# Patient Record
Sex: Female | Born: 1955 | Race: Black or African American | Hispanic: No | Marital: Single | Smoking: Never smoker
Health system: Southern US, Community
[De-identification: ages and names within clinical notes are randomized; demographics above are authoritative.]

## PROBLEM LIST (undated history)

## (undated) DIAGNOSIS — E079 Disorder of thyroid, unspecified: Secondary | ICD-10-CM

## (undated) DIAGNOSIS — I10 Essential (primary) hypertension: Secondary | ICD-10-CM

## (undated) DIAGNOSIS — E785 Hyperlipidemia, unspecified: Secondary | ICD-10-CM

## (undated) DIAGNOSIS — G4730 Sleep apnea, unspecified: Secondary | ICD-10-CM

## (undated) DIAGNOSIS — K219 Gastro-esophageal reflux disease without esophagitis: Secondary | ICD-10-CM

## (undated) DIAGNOSIS — J36 Peritonsillar abscess: Secondary | ICD-10-CM

## (undated) DIAGNOSIS — Z72820 Sleep deprivation: Secondary | ICD-10-CM

## (undated) DIAGNOSIS — M542 Cervicalgia: Secondary | ICD-10-CM

## (undated) DIAGNOSIS — F419 Anxiety disorder, unspecified: Secondary | ICD-10-CM

## (undated) DIAGNOSIS — F4310 Post-traumatic stress disorder, unspecified: Secondary | ICD-10-CM

## (undated) DIAGNOSIS — E669 Obesity, unspecified: Secondary | ICD-10-CM

## (undated) HISTORY — DX: Sleep deprivation: Z72.820

## (undated) HISTORY — DX: Hyperlipidemia, unspecified: E78.5

## (undated) HISTORY — DX: Obesity, unspecified: E66.9

## (undated) HISTORY — PX: BREAST LUMPECTOMY: SHX2

## (undated) HISTORY — PX: COLONOSCOPY: SHX174

## (undated) HISTORY — DX: Essential (primary) hypertension: I10

## (undated) HISTORY — DX: Disorder of thyroid, unspecified: E07.9

## (undated) HISTORY — DX: Post-traumatic stress disorder, unspecified: F43.10

## (undated) HISTORY — DX: Sleep apnea, unspecified: G47.30

## (undated) HISTORY — DX: Peritonsillar abscess: J36

## (undated) HISTORY — DX: Cervicalgia: M54.2

## (undated) HISTORY — DX: Anxiety disorder, unspecified: F41.9

## (undated) HISTORY — DX: Gastro-esophageal reflux disease without esophagitis: K21.9

## (undated) HISTORY — PX: ABDOMINAL HYSTERECTOMY: SHX81

---

## 2001-11-03 ENCOUNTER — Other Ambulatory Visit: Admission: RE | Admit: 2001-11-03 | Discharge: 2001-11-03 | Payer: Self-pay | Admitting: Obstetrics and Gynecology

## 2001-11-21 ENCOUNTER — Encounter: Admission: RE | Admit: 2001-11-21 | Discharge: 2002-02-19 | Payer: Self-pay | Admitting: Obstetrics and Gynecology

## 2003-01-29 ENCOUNTER — Other Ambulatory Visit: Admission: RE | Admit: 2003-01-29 | Discharge: 2003-01-29 | Payer: Self-pay | Admitting: Obstetrics and Gynecology

## 2004-02-04 ENCOUNTER — Other Ambulatory Visit: Admission: RE | Admit: 2004-02-04 | Discharge: 2004-02-04 | Payer: Self-pay | Admitting: Obstetrics and Gynecology

## 2005-06-14 ENCOUNTER — Ambulatory Visit (HOSPITAL_BASED_OUTPATIENT_CLINIC_OR_DEPARTMENT_OTHER): Admission: RE | Admit: 2005-06-14 | Discharge: 2005-06-14 | Payer: Self-pay | Admitting: Surgery

## 2005-06-14 ENCOUNTER — Ambulatory Visit (HOSPITAL_COMMUNITY): Admission: RE | Admit: 2005-06-14 | Discharge: 2005-06-14 | Payer: Self-pay | Admitting: Surgery

## 2005-11-04 ENCOUNTER — Ambulatory Visit (HOSPITAL_BASED_OUTPATIENT_CLINIC_OR_DEPARTMENT_OTHER): Admission: RE | Admit: 2005-11-04 | Discharge: 2005-11-04 | Payer: Self-pay | Admitting: Surgery

## 2005-11-04 ENCOUNTER — Ambulatory Visit (HOSPITAL_COMMUNITY): Admission: RE | Admit: 2005-11-04 | Discharge: 2005-11-04 | Payer: Self-pay | Admitting: Surgery

## 2007-10-17 ENCOUNTER — Encounter (INDEPENDENT_AMBULATORY_CARE_PROVIDER_SITE_OTHER): Payer: Self-pay | Admitting: Obstetrics & Gynecology

## 2007-10-17 ENCOUNTER — Ambulatory Visit (HOSPITAL_COMMUNITY): Admission: RE | Admit: 2007-10-17 | Discharge: 2007-10-18 | Payer: Self-pay | Admitting: Obstetrics & Gynecology

## 2010-03-25 ENCOUNTER — Encounter: Admission: RE | Admit: 2010-03-25 | Discharge: 2010-03-25 | Payer: Self-pay | Admitting: Internal Medicine

## 2010-11-08 HISTORY — PX: PARTIAL HYSTERECTOMY: SHX80

## 2010-11-08 HISTORY — PX: LAPAROSCOPIC TOTAL HYSTERECTOMY: SUR800

## 2011-03-23 NOTE — Op Note (Signed)
NAMESHERIAL, Rebecca [name]            ACCOUNT NO.:  [id number]   MEDICAL RECORD NO.:  [id number]          PATIENT TYPE:  AMB   LOCATION:  DAY                          FACILITY:  [facility]   PHYSICIAN:  Genia Del, M.D.DATE OF BIRTH:  1956/02/14   DATE OF PROCEDURE:  10/17/2007  DATE OF DISCHARGE:                               OPERATIVE REPORT   PREOPERATIVE DIAGNOSIS:  Large multiple symptomatic uterine myomas.   POSTOPERATIVE DIAGNOSIS:  Large multiple symptomatic uterine myomas.   PROCEDURE PERFORMED:  Total laparoscopic hysterectomy, assisted with Da  Vinci robot.   SURGEON:  Genia Del, M.D.   ASSISTANT:  Leanora Ivanoff, M.D.   ANESTHESIA:  General anesthesia with endotracheal intubation.   PROCEDURE IN DETAIL:  Under general anesthesia with endotracheal  intubation, the patient in the lithotomy position, she is prepped with  Betadine in the abdominal, suprapubic, vulvar, and vaginal areas.  The  bladder is catheterized and the Foley is left in place, and the patient  is draped as usual.  The vaginal examination reveals a uterus that goes  to the umbilicus, measuring 18 cm to 20 cm.  It is mobile.  No adnexal  mass is felt.  We introduced a weighted speculum in the vagina.  The  anterior lip of the cervix is grasped with a tenaculum.  We did a  hysterometry, which is at 8 cm.  We then dilated the cervix with Hegar  dilators and inserted the cord ring with a Rumi, as usual.  We then  removed all other instruments.  We go to abdominal time.  We measure 10  cm above the fundus of the uterus and infiltrated the skin at that level  with Marcaine 0.25% plain.  We then make a 1.5-cm incision with the  scalpel.  We open the aponeurosis under direct vision with Mayo scissors  and the parietal peritoneum bluntly with the finger.  We put a  pursestring suture of Vicryl #0 at the aponeurosis.  We inserted the  sign and the camera at that level.  Inspection of the  abdominopelvic  cavity reveals a very large uterus, very irregular, with multiple large  fibroids.  No other pathology is seen.  No adhesion is present.  We  therefore measure all other sites.  The pneumoperitoneum was made with  CO2.  We have three robotic trocars and one assistance port.  Those are  placed in a W configuration.  We then docked the robot as usual without  difficulty and inserted the instrument.  In the right hand, the  EndoSHEARS scissors, in the left hand the bipolar fenestrated clamp, and  in the third robotic arm, the Cobra clamp.  We started the consult.  We  began on the left side.  We cauterize in sections.  The left round  ligament, the left tube, and the left utero-ovarian ligament.  Fibroids  are present on the broad ligament of that site, which made the surgery a  little bit more difficult, but we are able with the third arm to expose  well and review until we reach surgery for the uterine artery on the  left.  We opened the visceral peritoneum over the lower uterine segment  and reclined the bladder downward.  We then switched side and do exactly  the same thing on the right side.  The bladder is further reclined  downward.  We then skeletonized the uterine arteries on each side and  cauterized them in sections.  We noticed that the uterus is blanching.  We then positioned ourselves to open the vagina anterior over the cord  ring.  We felt the colpotomy anteriorly and proceed on each side and  then finish posteriorly.  The uterus is therefore completely detached.  We proceeded above the uterosacral ligaments to keep this portion of the  vagina.  We then removed the cord ring with the Rumi vaginally and let  the uterus sit in the abdomen while we closed the vaginal mucosa.  We  put the Kleppinger in the vagina to keep the pneumoperitoneum.  We used  Vicryl #0 in figure-of-eight to suture the vaginal vault.  It is  completely closed.  Hemostasis is adequate.   Note that the cutting  needle driver was used in the right hand, the normal needle driver in  the left, and in the third arm the fenestrated bipolar was kept.  We  then removed all instruments.  We undocked the robot and go to  laparoscopic time.  We removed the assistant port and replaced it by the  morcellator.  Morcellation of the uterus is done easily.  The specimen  is sent to pathology.  We then irrigate and suction the abdominopelvic  cavity.  Hemostasis is adequate at all levels.  We then removed all  instruments.  We removed the ports under direct vision.  We closed the  supraumbilical incision at the aponeurosis by attaching the pursestring  stitch.  We then closed all incisions at the skin with a subcuticular  Vicryl 4-0.  We then add Dermabond on top.  We remove the procedure in  the vagina.  Hemostasis was adequate at all levels.  The counts for  instruments and sponges were complete.  The estimated blood loss was 100  mL.  No complication occurred, and the patient was brought to the  recovery room in good stable fashion.  Note that a dose of Ancef 1 gram  intravenously was given at the beginning of the intervention.      Genia Del, M.D.  Electronically Signed     ML/MEDQ  D:  10/17/2007  T:  10/17/2007  Job:  [id number]

## 2011-03-26 NOTE — Op Note (Signed)
Rebecca [name], Rebecca [name]            ACCOUNT NO.:  [id number]   MEDICAL RECORD NO.:  [id number]          PATIENT TYPE:  AMB   LOCATION:  NESC                         FACILITY:  [facility]   PHYSICIAN:  Currie Paris, M.D.DATE OF BIRTH:  21-Jun-1956   DATE OF PROCEDURE:  06/14/2005  DATE OF DISCHARGE:                                 OPERATIVE REPORT   OFFICE NUMBER:  [facility] [phone].   PREOPERATIVE DIAGNOSIS:  Fistula in ano.   POSTOPERATIVE DIAGNOSIS:  Fistula in ano.   PROCEDURE:  Exam under exam under anesthesia with placement of fistula plug.   SURGEON:  Currie Paris, M.D.   ASSISTANT:  Stormy Card, P.A. student   ANESTHESIA:  General endotracheal.   CLINICAL HISTORY:  This is a 48-year lady who has recently had a fairly  large perirectal abscess.  On followup, she has continued to have drainage  and we felt she had a fistula in ano.   DESCRIPTION OF PROCEDURE:  The patient was taken to the holding area.  She  had no further questions.  She confirmed the plans for the procedures as  exam under anesthesia needle and either fistulotomy or placement of Seton or  fistula plug.  The patient taken to the operating room and after  satisfactory general tracheal anesthesia had been obtained, she was placed  in the prone position with appropriate padding. The time-out occurred.   The perianal area had been prepped and draped with some Betadine. We  inserted the anoscope and initially did not see any internal opening or any  inflammatory process internally.  However, there was the external opening  which was in the left posterior aspect. I placed a probe in this and got it  heading down towards the anus and underneath the external sphincter but  initially did not seen any internal opening still.  I then irrigated the  tract was some peroxide and saw some peroxide coming into the anus.  Once I  identified the internal opening, I was then able to get a probe through  this.  I  irrigated the tract out with some peroxide and then pulled a 2-0  silk suture through this. It looked like we were going directly posteriorly  as an entry into the rectum, and I elected to put a fistula plug in. The  plug was moistened for 10 minutes and then pulled through the tract. It was  secured with a figure-of-eight stitch of 3-0 chromic followed by second 3-0  chromic to be sure we had it completely covered.  It was then anchored  externally with another 3-0 chromic.   The fistula plug appeared to be nicely in situ.  There is no exposure of the  plug, and I thought we had it well anchored.   Sterile dressings applied. The patient tolerated the procedure well.  There  were no complications. All counts were correct.       CJS/MEDQ  D:  06/14/2005  T:  06/15/2005  Job:  [id number]

## 2011-03-26 NOTE — Op Note (Signed)
[name], [name]            ACCOUNT NO.:  [id number]   MEDICAL RECORD NO.:  [id number]          PATIENT TYPE:  AMB   LOCATION:  NESC                         FACILITY:  [facility]   PHYSICIAN:  Currie Paris, M.D.DATE OF BIRTH:  1955-12-02   DATE OF PROCEDURE:  11/04/2005  DATE OF DISCHARGE:                                 OPERATIVE REPORT   CCS:  [phone].   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   OPERATION:  Partial fistulotomy with placement Seton.   SURGEON:  Currie Paris, M.D.   ANESTHESIA:  General.   CLINICAL HISTORY:  This lady is 55 year old who presented with a fistula-in-  ano that had a fistula plug placed, but which was not successful in  completely eliminating the fistula. Because of continued presence of the  fistula, we elected to bring her back to the operating room for fistulotomy.   DESCRIPTION OF PROCEDURE:  The patient was seen in the holding area and she  had no further questions. We reviewed the plans and the possible placement  of Seton again.   The patient was taken to the operating room and after satisfactory general  anesthesia had been obtained was placed lithotomy position. The perirectal  areas were prepped and draped as a sterile field. The time-out occurred.   Anoscope was placed. The fistula tract was noted to be opening left  posteriorly. I was able to readily place a probe through the fistula and it  did go through and under a little bit of the sphincter muscle.   I opened the fistula starting from the external opening going towards the  anus for about 80% of its length. However I avoided cutting the sphincter  muscle leaving a band of what looked to be internal sphincter intact. I then  placed a #0 silk suture through there and tied it down as a Seton.   I made sure all the chronic granulations had been either cauterized or  scraped away. I put some 0.5%  Marcaine with epi in to help with postop  analgesia. Sterile dressings were applied. The patient tolerated the  procedure well. There were no operative complications. All counts were  correct.     Currie Paris, M.D.  Electronically Signed    CJS/MEDQ  D:  11/04/2005  T:  11/04/2005  Job:  [id number]

## 2011-08-16 LAB — CBC
HCT: 36.8
HCT: 39.9
Hemoglobin: 12.5
Hemoglobin: 13.5
MCHC: 33.8
MCHC: 34
MCV: 88.4
MCV: 88.7
RBC: 4.15
RDW: 13.8

## 2012-06-16 ENCOUNTER — Ambulatory Visit (INDEPENDENT_AMBULATORY_CARE_PROVIDER_SITE_OTHER): Payer: BC Managed Care – PPO | Admitting: Physician Assistant

## 2012-06-16 VITALS — BP 116/76 | HR 80 | Temp 98.3°F | Resp 16 | Ht 63.0 in | Wt 236.2 lb

## 2012-06-16 DIAGNOSIS — H6120 Impacted cerumen, unspecified ear: Secondary | ICD-10-CM

## 2012-06-16 DIAGNOSIS — E7800 Pure hypercholesterolemia, unspecified: Secondary | ICD-10-CM | POA: Insufficient documentation

## 2012-06-16 DIAGNOSIS — E785 Hyperlipidemia, unspecified: Secondary | ICD-10-CM | POA: Insufficient documentation

## 2012-06-16 DIAGNOSIS — M79643 Pain in unspecified hand: Secondary | ICD-10-CM | POA: Insufficient documentation

## 2012-06-16 DIAGNOSIS — B353 Tinea pedis: Secondary | ICD-10-CM

## 2012-06-16 NOTE — Progress Notes (Addendum)
  Subjective:    Patient ID: Rebecca [name], female    DOB: August 20, 1956, 56 y.o.   MRN: [id number]  HPI This 56 y.o. female presents for evaluation of ear pain, decreased hearing, feeling off balance.  Began 3 days ago, resolved briefly, but returned yesterday and is progressively worsening.  Has developed fatigue/generalized malaise. Also notes itching on the bottom of the tight foot x 1 week.  Concerned that she may have a fungus.  Has tried no medications to treat her symptoms, though considered an OTC Athlete's foot cream.  No nasal congestion, drainage, sore throat, cough, GI/GU symptoms.    Review of Systems As above.  NO CP, SOB, HA.  Past Medical History  Diagnosis Date  . Hyperlipidemia   . Obesity     Past Surgical History  Procedure Date  . Abdominal hysterectomy     Prior to Admission medications   Medication Sig Start Date End Date Taking? Authorizing Provider  Multiple Vitamins-Minerals (MULTIVITAMIN PO) Take 1 tablet by mouth.   Yes Historical Provider, MD  Omega-3 Fatty Acids (FISH OIL) 1200 MG CAPS Take 1,200 mg by mouth every morning.   Yes Historical Provider, MD    No Known Allergies  History   Social History  . Marital Status: Single    Spouse Name: N/A    Number of Children: 0  . Years of Education: 18.5   Occupational History  . Retired [employer]    [employer] and [employer] x 30 years  . 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 Smoking status: Never Smoker   . Smokeless tobacco: Never Used  . Alcohol Use: 2.5 - 3.5 oz/week    5-7 drink(s) per week  . Drug Use: Not on file  . Sexually Active: Not on file   Other Topics Concern  . Not on file   Social History Narrative   Close friend/significant other killed (GSW) 2012.Retired after 30 years, and took another job (commutes to [place] 4 days/week).    Family History  Problem Relation Age of Onset  . Cancer Mother     pancreatic  cancer  . Diabetes Mother   . Heart disease Mother     rheumatic heart disease  . COPD Sister   . Sleep apnea Sister        Objective:   Physical Exam  Blood pressure 116/76, pulse 80, temperature 98.3 F (36.8 C), temperature source Oral, resp. rate 16, height 5\' 3"  (1.6 m), weight 236 lb 3.2 oz (107.14 kg), SpO2 98.00%. Body mass index is 41.84 kg/(m^2). Well-developed, well nourished BF who is awake, alert and oriented, in NAD. HEENT: [place]/AT, PERRL, EOMI.  Sclera and conjunctiva are clear.  Left canal is impacted with cerumen.  Irrigation performed and impaction removed. Then EAC are patent, TMs are normal in appearance. Nasal mucosa is pink and moist. OP is clear. Neck: supple, non-tender, no lymphadenopathy, thyromegaly. Heart: RRR, no murmur Lungs: CTA Abdomen: normo-active bowel sounds, supple, non-tender, no mass or organomegaly. Extremities: no cyanosis, clubbing or edema. Skin: warm and dry with mild erythema of the toes and web spaces of the toes on the left foot.     Assessment & Plan:   1. Cerumen impaction   2.  Tinea Pedis.  If symptoms persist, consider ETD as cause and I would call in Ipratropium bromide nasal spray. OTC antifungal cream BID until resolved.

## 2012-08-02 ENCOUNTER — Ambulatory Visit: Payer: BC Managed Care – PPO

## 2012-08-02 ENCOUNTER — Ambulatory Visit (INDEPENDENT_AMBULATORY_CARE_PROVIDER_SITE_OTHER): Payer: BC Managed Care – PPO | Admitting: Family Medicine

## 2012-08-02 VITALS — BP 138/74 | HR 83 | Temp 98.0°F | Resp 18 | Ht 62.5 in | Wt 236.2 lb

## 2012-08-02 DIAGNOSIS — R079 Chest pain, unspecified: Secondary | ICD-10-CM

## 2012-08-02 MED ORDER — GI COCKTAIL ~~LOC~~
30.0000 mL | Freq: Once | ORAL | Status: AC
  Administered 2012-08-02: 30 mL via ORAL

## 2012-08-02 NOTE — Progress Notes (Signed)
[facility] [address] [place] [address] [phone]  Date:  08/02/2012   Name:  Rebecca [name]   DOB:  08/06/1956   MRN:  [id number]  PCP:  No primary provider on file.    Chief Complaint: Chest Pain and Shortness of Breath   History of Present Illness:  Rebecca [name] is a 56 y.o. very pleasant female patient who presents with the following:  She is here today with a concern about possible indigestion causing left sided CP.  She noted the symptoms when she woke up Monday morning.  She feels an ache in her left chest, down her left arm and in her left back.  It feels like a crick in her neck- it hurts if she twists her upper torso. She "has to turn her whole body instead of just her head" when she looks to the left or she has pain.  No trauma.   She has tried some OTC indigestion medications, but these have not really helped.  She has taken some Tums and room- temperature Pepsi.  These things usually help for her indigestion, but they have not helped this time.   She has no history of heart problems.  She does have high cholesterol.  Never a smoker.   Family history: her father died of bone cancer, mother had pancreatic cancer.  One brother had an MI at age 80.  Other maternal relatives suffered from cancer.    She does feel "a little winded" with this- it hurts to take a deep breath.    She has never had this in the past.    The symptom have been persistent since Monday.  Not worse with walking or other exertion.   She works a lot and travels to [place] and back most days for her job.    Patient Active Problem List  Diagnosis  . Hand pain  . Hyperlipidemia    Past Medical History  Diagnosis Date  . Hyperlipidemia   . Obesity     Past Surgical History  Procedure Date  . Abdominal hysterectomy     History  Substance Use Topics  . Smoking status: Never Smoker   . Smokeless tobacco: Never Used  . Alcohol Use: 2.5 - 3.5 oz/week   5-7 drink(s) per week    Family History  Problem Relation Age of Onset  . Cancer Mother     pancreatic cancer  . Diabetes Mother   . Heart disease Mother     rheumatic heart disease  . COPD Sister   . Sleep apnea Sister     No Known Allergies  Medication list has been reviewed and updated.  Current Outpatient Prescriptions on File Prior to Visit  Medication Sig Dispense Refill  . Multiple Vitamins-Minerals (MULTIVITAMIN PO) Take 1 tablet by mouth.      . Omega-3 Fatty Acids (FISH OIL) 1200 MG CAPS Take 1,200 mg by mouth every morning.        Review of Systems:  As per HPI- otherwise negative.   Physical Examination: Filed Vitals:   08/02/12 1543  BP: 138/74  Pulse: 83  Temp: 98 F (36.7 C)  Resp: 18   Filed Vitals:   08/02/12 1543  Height: 5' 2.5" (1.588 m)  Weight: 236 lb 3.2 oz (107.14 kg)   Body mass index is 42.51 kg/(m^2). Ideal Body Weight: Weight in (lb) to have BMI = 25: 138.6   GEN: WDWN, NAD, Non-toxic, A & O x  3, obese HEENT: Atraumatic, Normocephalic. Neck supple. No masses, No LAD. Bilateral TM wnl, oropharynx normal.  PEERL,EOMI.   Ears and Nose: No external deformity. CV: RRR, No M/G/R. No JVD. No thrill. No extra heart sounds.  Pressing on her left upper chest wall bring back the same pain that she has noted at home.  There is no redness, rash, or other abnormality of the skin PULM: CTA B, no wheezes, crackles, rhonchi. No retractions. No resp. distress. No accessory muscle use. ABD: S, NT, ND.  EXTR: No c/c/e NEURO Normal gait.  PSYCH: Normally interactive. Conversant. Not depressed or anxious appearing.  Calm demeanor.   [facility] reading (PRIMARY) by  Dr. Patsy Lager.  CXR: negative EKG: NSR, no ST elevation or depression, normal tracing  Gave a GI cocktail- this did seem to help decrease her symptoms  Assessment and Plan: 1. Chest pain  EKG 12-Lead, gi cocktail (Maalox,Lidocaine,Donnatal), DG Chest 2 View   Recie is having CP which seems to  be due to her chest wall.  I am able to reproduce pain which she states is the same pain she has noted at home by pressing on her chest wall.  The GI cocktail also seemed to help some, so there may be an element of GERD as well.  Recommended that she try tylenol for her chest wall pain, and pepcid or zantac for her GERD.  Explained that I cannot totally rule- out other etiologies such as cardiac pain or a PE here at clinic, but that chest wall pain is the more likely culprit today.  She declined an ED evaluation, but will seek care right away if he symptoms change or get worse.    Abbe Amsterdam, MD

## 2012-08-04 ENCOUNTER — Emergency Department (HOSPITAL_COMMUNITY)
Admission: EM | Admit: 2012-08-04 | Discharge: 2012-08-04 | Disposition: A | Discharge status: Home / Self Care | Payer: BC Managed Care – PPO | Attending: Emergency Medicine | Admitting: Emergency Medicine

## 2012-08-04 ENCOUNTER — Emergency Department (HOSPITAL_COMMUNITY): Payer: BC Managed Care – PPO

## 2012-08-04 ENCOUNTER — Encounter (HOSPITAL_COMMUNITY): Payer: Self-pay | Admitting: *Deleted

## 2012-08-04 DIAGNOSIS — M5412 Radiculopathy, cervical region: Secondary | ICD-10-CM

## 2012-08-04 DIAGNOSIS — E669 Obesity, unspecified: Secondary | ICD-10-CM | POA: Insufficient documentation

## 2012-08-04 DIAGNOSIS — E785 Hyperlipidemia, unspecified: Secondary | ICD-10-CM | POA: Insufficient documentation

## 2012-08-04 LAB — CBC WITH DIFFERENTIAL/PLATELET
Basophils Absolute: 0 10*3/uL (ref 0.0–0.1)
Basophils Relative: 0 % (ref 0–1)
Eosinophils Absolute: 0.2 10*3/uL (ref 0.0–0.7)
Eosinophils Relative: 2 % (ref 0–5)
HCT: 37.1 % (ref 36.0–46.0)
MCH: 28.7 pg (ref 26.0–34.0)
MCHC: 32.3 g/dL (ref 30.0–36.0)
MCV: 88.8 fL (ref 78.0–100.0)
Monocytes Absolute: 0.7 10*3/uL (ref 0.1–1.0)
Platelets: 251 10*3/uL (ref 150–400)
RDW: 13.1 % (ref 11.5–15.5)
WBC: 10.1 10*3/uL (ref 4.0–10.5)

## 2012-08-04 LAB — BASIC METABOLIC PANEL
CO2: 23 mEq/L (ref 19–32)
Calcium: 9.1 mg/dL (ref 8.4–10.5)
Creatinine, Ser: 0.65 mg/dL (ref 0.50–1.10)
GFR calc Af Amer: >90 mL/min (ref >90)
GFR calc non Af Amer: >90 mL/min (ref >90)
Sodium: 137 mEq/L (ref 135–145)

## 2012-08-04 LAB — PRO B NATRIURETIC PEPTIDE: Pro B Natriuretic peptide (BNP): 61.9 pg/mL (ref 0–125)

## 2012-08-04 LAB — POCT I-STAT TROPONIN I: Troponin i, poc: 0 ng/mL (ref 0.00–0.08)

## 2012-08-04 MED ORDER — ONDANSETRON HCL 4 MG/2ML IJ SOLN: 4.0000 mg | Freq: Once | INTRAMUSCULAR | Status: DC

## 2012-08-04 MED ORDER — IBUPROFEN 600 MG PO TABS: 600.0000 mg | ORAL_TABLET | Freq: Four times a day (QID) | ORAL | Status: DC | PRN

## 2012-08-04 MED ORDER — OXYCODONE-ACETAMINOPHEN 7.5-325 MG PO TABS: 1.0000 | ORAL_TABLET | ORAL | Status: DC | PRN

## 2012-08-04 MED ORDER — HYDROMORPHONE HCL PF 1 MG/ML IJ SOLN: 1.0000 mg | Freq: Once | INTRAMUSCULAR | Status: DC

## 2012-08-04 MED ORDER — ONDANSETRON 4 MG PO TBDP
4.0000 mg | ORAL_TABLET | Freq: Once | ORAL | Status: AC
  Administered 2012-08-04: 4 mg via ORAL
  Filled 2012-08-04: qty 1

## 2012-08-04 MED ORDER — HYDROMORPHONE HCL PF 2 MG/ML IJ SOLN
2.0000 mg | Freq: Once | INTRAMUSCULAR | Status: AC
  Administered 2012-08-04: 2 mg via INTRAMUSCULAR
  Filled 2012-08-04: qty 1

## 2012-08-04 MED ORDER — KETOROLAC TROMETHAMINE 60 MG/2ML IM SOLN
60.0000 mg | Freq: Once | INTRAMUSCULAR | Status: AC
  Administered 2012-08-04: 60 mg via INTRAMUSCULAR
  Filled 2012-08-04: qty 2

## 2012-08-04 MED ORDER — KETOROLAC TROMETHAMINE 30 MG/ML IJ SOLN: 30.0000 mg | Freq: Once | INTRAMUSCULAR | Status: DC

## 2012-08-04 MED ORDER — ASPIRIN 81 MG PO CHEW
324.0000 mg | CHEWABLE_TABLET | Freq: Once | ORAL | Status: AC
  Administered 2012-08-04: 324 mg via ORAL
  Filled 2012-08-04: qty 4

## 2012-08-04 NOTE — ED Notes (Signed)
Pt reports a lot of events in life possible causing stresss

## 2012-08-04 NOTE — ED Notes (Signed)
Patient returned from X-ray 

## 2012-08-04 NOTE — ED Notes (Signed)
MD at bedside. 

## 2012-08-04 NOTE — ED Notes (Signed)
Pt present to ED with c/o CP since Monday.  Pt went to [facility] on wednesday. And received cxr, ekg which appears to be normal.  Pt was advised to come to ED if symptoms persist.  Pt c/o left side CP radiate to left shoulder and back.  Pt states "I thought it was indigestion so I took antacid and it just made me feel worse."  Pt took tylenol and xanax and no relief

## 2012-08-04 NOTE — ED Notes (Signed)
Pt verbalizes understanding 

## 2012-08-04 NOTE — ED Provider Notes (Signed)
History     CSN: [id number]  Arrival date & time 08/04/12  [id number]   First MD Initiated Contact with Patient 08/04/12 2042      Chief Complaint  Patient presents with  . Chest Pain     HPI Pt present to ED with c/o CP since Monday. Pt went to [facility] on wednesday. And received cxr, ekg which appears to be normal. Pt was advised to come to ED if symptoms persist. Pt c/o left side CP radiate to left shoulder and back. Pt states "I thought it was indigestion so I took antacid and it just made me feel worse." Pt took tylenol and xanax and no relief.  Pain is made worse with movement of left upper extremity and back.  Pain does radiate down left arm associated with some numbness.  Pain is been constant since Monday.  Past Medical History  Diagnosis Date  . Hyperlipidemia   . Obesity     Past Surgical History  Procedure Date  . Abdominal hysterectomy     Family History  Problem Relation Age of Onset  . Cancer Mother     pancreatic cancer  . Diabetes Mother   . Heart disease Mother     rheumatic heart disease  . COPD Sister   . Sleep apnea Sister     History  Substance Use Topics  . Smoking status: Never Smoker   . Smokeless tobacco: Never Used  . Alcohol Use: 2.5 - 3.5 oz/week    5-7 drink(s) per week    OB History    Grav Para Term Preterm Abortions TAB SAB Ect Mult Living                  Review of Systems  All other systems reviewed and are negative.    Allergies  Review of patient's allergies indicates no known allergies.  Home Medications   Current Outpatient Rx  Name Route Sig Dispense Refill  . MULTIVITAMIN PO Oral Take 1 tablet by mouth.    Marland Kitchen FISH OIL 1200 MG PO CAPS Oral Take 1,200 mg by mouth every morning.    . IBUPROFEN 600 MG PO TABS Oral Take 1 tablet (600 mg total) by mouth every 6 (six) hours as needed for pain. 30 tablet 0  . OXYCODONE-ACETAMINOPHEN 7.5-325 MG PO TABS Oral Take 1 tablet by mouth every 4 (four) hours as needed for  pain. 30 tablet 0    BP 134/74  Pulse 67  Temp 98.2 F (36.8 C) (Oral)  Resp 20  SpO2 100%  Physical Exam  Nursing note and vitals reviewed. Constitutional: She is oriented to person, place, and time. She appears well-developed. No distress.  HENT:  Head: Normocephalic and atraumatic.  Eyes: Pupils are equal, round, and reactive to light.  Neck: Normal range of motion.  Cardiovascular: Normal rate and intact distal pulses.   Pulmonary/Chest: No accessory muscle usage. No respiratory distress.         Reproduces the pain with palpation  Abdominal: Normal appearance. She exhibits no distension.  Musculoskeletal: Normal range of motion.  Neurological: She is alert and oriented to person, place, and time. No cranial nerve deficit.  Skin: Skin is warm and dry. No rash noted.  Psychiatric: She has a normal mood and affect. Her behavior is normal.    ED Course  Procedures (including critical care time)  Date: 08/04/2012  Rate: 72  Rhythm: normal sinus rhythm  QRS Axis: normal  Intervals: normal  ST/T  Wave abnormalities: normal  Conduction Disutrbances: none  Narrative Interpretation: unremarkable       Labs Reviewed  CBC WITH DIFFERENTIAL  POCT I-STAT TROPONIN I  BASIC METABOLIC PANEL  PRO B NATRIURETIC PEPTIDE   Dg Chest 2 View  08/04/2012  *RADIOLOGY REPORT*  Clinical Data: Left chest pain.  CHEST - 2 VIEW  Comparison: None.  Findings: Two views of the chest demonstrate clear lungs. Heart and mediastinum are within normal limits.  The trachea is midline. Bony structures are intact  IMPRESSION: No acute chest findings.   Original Report Authenticated By: Richarda Overlie, M.D.      1. Cervical radicular pain       MDM          Nelia Shi, MD 08/04/12 2241

## 2012-08-04 NOTE — ED Notes (Signed)
Pt c/o sob.

## 2012-08-10 ENCOUNTER — Ambulatory Visit (INDEPENDENT_AMBULATORY_CARE_PROVIDER_SITE_OTHER): Payer: BC Managed Care – PPO | Admitting: Physician Assistant

## 2012-08-10 ENCOUNTER — Ambulatory Visit: Payer: BC Managed Care – PPO

## 2012-08-10 ENCOUNTER — Encounter: Payer: Self-pay | Admitting: Physician Assistant

## 2012-08-10 VITALS — BP 158/90 | HR 86 | Temp 98.4°F | Resp 18 | Ht 62.5 in | Wt 235.0 lb

## 2012-08-10 DIAGNOSIS — M79603 Pain in arm, unspecified: Secondary | ICD-10-CM

## 2012-08-10 DIAGNOSIS — R202 Paresthesia of skin: Secondary | ICD-10-CM

## 2012-08-10 DIAGNOSIS — M79609 Pain in unspecified limb: Secondary | ICD-10-CM

## 2012-08-10 DIAGNOSIS — Z23 Encounter for immunization: Secondary | ICD-10-CM

## 2012-08-10 DIAGNOSIS — R0789 Other chest pain: Secondary | ICD-10-CM

## 2012-08-10 DIAGNOSIS — R209 Unspecified disturbances of skin sensation: Secondary | ICD-10-CM

## 2012-08-10 DIAGNOSIS — M47812 Spondylosis without myelopathy or radiculopathy, cervical region: Secondary | ICD-10-CM

## 2012-08-10 DIAGNOSIS — R071 Chest pain on breathing: Secondary | ICD-10-CM

## 2012-08-10 MED ORDER — CYCLOBENZAPRINE HCL 10 MG PO TABS: 10.0000 mg | ORAL_TABLET | Freq: Three times a day (TID) | ORAL | Status: DC | PRN

## 2012-08-10 MED ORDER — MELOXICAM 15 MG PO TABS: 15.0000 mg | ORAL_TABLET | Freq: Every day | ORAL | Status: DC

## 2012-08-10 NOTE — Progress Notes (Signed)
Subjective:    Patient ID: Rebecca [name], female    DOB: 14-Mar-1956, 56 y.o.   MRN: [id number]  HPI  This 56 y.o. female presents for evaluation of chest and left shoulder pain that radiates into the left arm, and tingling in the left index finger.  She presented with these symptoms and indigestion here 08/02/2012 where she was evaluated urgently.  EKG and CXR at that time were both normal and on exam her chest pain was reproducible with palpation of the chest wall and back.  She was advised to use acetaminophen and OTC H2 blocker/PPI.  When her symptoms worsened, she was evaluated in the ED 08/04/2012, where repeat EKG and CXR were normal.  She was told that she may have a pinched nerve, and that if her symptoms continued, she should have an MRI. She was prescribed percocet, which helps, but makes her too sleepy to take during her time at work.  Today she reports continued symptoms, having to turn her whole body to move her head laterally.  The symptoms are still only on the left.  She denies neck pain, HA, dizziness.  Only the index finger is tingling.  No lower extremity symptoms.  No shortness of breath.   Review of Systems As above.   Past Medical History  Diagnosis Date  . Hyperlipidemia   . Obesity     Past Surgical History  Procedure Date  . Abdominal hysterectomy     Prior to Admission medications   Medication Sig Start Date End Date Taking? Authorizing Provider  ibuprofen (ADVIL,MOTRIN) 600 MG tablet Take 1 tablet (600 mg total) by mouth every 6 (six) hours as needed for pain. 08/04/12  Yes Nelia Shi, MD  Multiple Vitamins-Minerals (MULTIVITAMIN PO) Take 1 tablet by mouth.   Yes Historical Provider, MD  Omega-3 Fatty Acids (FISH OIL) 1200 MG CAPS Take 1,200 mg by mouth every morning.   Yes Historical Provider, MD  oxyCODONE-acetaminophen (PERCOCET) 7.5-325 MG per tablet Take 1 tablet by mouth every 4 (four) hours as needed for pain. 08/04/12  Yes Nelia Shi, MD     No Known Allergies  History   Social History  . Marital Status: Single    Spouse Name: N/A    Number of Children: 0  . Years of Education: 18.5   Occupational History  . Retired [employer]    [employer] x 30 years  . 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 Smoking status: Never Smoker   . Smokeless tobacco: Never Used  . Alcohol Use: 2.5 - 3.5 oz/week    5-7 drink(s) per week  . Drug Use: No  . Sexually Active: Yes -- Female partner(s)    Birth Control/ Protection: Surgical   Other Topics Concern  . Not on file   Social History Narrative   Close friend/significant other killed (GSW) 2012.Retired after 30 years, and took another job (commutes to [employer] 4 days/week).    Family History  Problem Relation Age of Onset  . Cancer Mother     pancreatic cancer  . Diabetes Mother   . Heart disease Mother     rheumatic heart disease  . COPD Sister   . Sleep apnea Sister        Objective:   Physical Exam  Blood pressure 158/90, pulse 86, temperature 98.4 F (36.9 C), temperature source Oral, resp. rate 18, height 5' 2.5" (1.588 m), weight  235 lb (106.595 kg), SpO2 97.00%. Body mass index is 42.30 kg/(m^2). Well-developed, well nourished BF who is awake, alert and oriented, in NAD but obviously uncomfortable. HEENT: [place]/AT, PERRL, EOMI.  Sclera and conjunctiva are clear.  EAC are patent, TMs are normal in appearance. Nasal mucosa is pink and moist. OP is clear. Neck: supple, non-tender, no lymphadenopathy, thyromegaly. Heart: RRR, no murmur Lungs: normal effort, CTA Extremities: no cyanosis, clubbing or edema.   Musculoskeletal: Good strength bilaterally, normal sensation except of the left index finger. No tenderness of the spine nor spinous processes.  No shoulder tenderness, though she does have left trapezius muscle tenderness. Exquisite tenderness of the left chest wall and upper back. Skin: warm and  dry without rash. Psychologic: good mood and appropriate affect, normal speech and behavior.  C-Spine: [facility] reading (PRIMARY) by  Dr. Nilda Simmer.  Anterior spurring and degenerative changes are noted at C3-5.  Loss of lordosis.  No spondylolisthesis. No fracture.     Assessment & Plan:   1. Arm pain  meloxicam (MOBIC) 15 MG tablet, cyclobenzaprine (FLEXERIL) 10 MG tablet  2. Paresthesia  DG Cervical Spine Complete, meloxicam (MOBIC) 15 MG tablet, cyclobenzaprine (FLEXERIL) 10 MG tablet  3. Chest wall pain  meloxicam (MOBIC) 15 MG tablet, cyclobenzaprine (FLEXERIL) 10 MG tablet  4. DJD (degenerative joint disease) of cervical spine  meloxicam (MOBIC) 15 MG tablet  5. Need for influenza vaccination  Flu vaccine greater than or equal to 3yo preservative free IM   She really doesn't want to take prednisone, so we'll try to avoid it for now.  Plan MRI of the neck if symptoms persist.

## 2012-08-10 NOTE — Patient Instructions (Signed)
Stop the Ibuprofen.  Use the Meloxicam instead.  Take it once daily with food.  The Flexeril (cyclobenzaprine) may make you too sleepy to take during the day.  If so, take it just at bedtime.

## 2012-08-24 ENCOUNTER — Encounter: Payer: Self-pay | Admitting: Physician Assistant

## 2012-08-24 ENCOUNTER — Ambulatory Visit (INDEPENDENT_AMBULATORY_CARE_PROVIDER_SITE_OTHER): Payer: BC Managed Care – PPO | Admitting: Physician Assistant

## 2012-08-24 VITALS — BP 121/78 | HR 91 | Temp 98.4°F | Resp 16 | Ht 63.0 in | Wt 233.0 lb

## 2012-08-24 DIAGNOSIS — M79609 Pain in unspecified limb: Secondary | ICD-10-CM

## 2012-08-24 DIAGNOSIS — M47812 Spondylosis without myelopathy or radiculopathy, cervical region: Secondary | ICD-10-CM

## 2012-08-24 DIAGNOSIS — R202 Paresthesia of skin: Secondary | ICD-10-CM

## 2012-08-24 DIAGNOSIS — R209 Unspecified disturbances of skin sensation: Secondary | ICD-10-CM

## 2012-08-24 DIAGNOSIS — M79603 Pain in arm, unspecified: Secondary | ICD-10-CM

## 2012-08-24 NOTE — Progress Notes (Signed)
Subjective:    Patient ID: Rebecca [name], female    DOB: [birth year], 56 y.o.   MRN: [id number]  HPI This 56 y.o. female presents for evaluation of back and arm pain and paresthesias.  Back pain resolved, but arm pain and numbness in fingers (index finger primarily) persist.  Constant.  Sleeps with arm on a heating pad, which helps, along with evening doses of meloxicam and flexeril.  Occasionally also takes the oxycodone she received in the ED.  Arm pain is somewhat positional, hurting more when the elbow is fully extended, less when it is flexed against her body.  She notes no overall improvement or worsening since her visit 2 weeks ago, despite treatment.  Symptoms have now been present just over 4 weeks.  Review of Systems As above.   Past Medical History  Diagnosis Date  . Hyperlipidemia   . Obesity     Past Surgical History  Procedure Date  . Abdominal hysterectomy     Prior to Admission medications   Medication Sig Start Date End Date Taking? Authorizing Provider  cyclobenzaprine (FLEXERIL) 10 MG tablet Take 1 tablet (10 mg total) by mouth 3 (three) times daily as needed for muscle spasms. 08/10/12  Yes Layloni Fahrner S Jordin Vicencio, PA-C  meloxicam (MOBIC) 15 MG tablet Take 1 tablet (15 mg total) by mouth daily. 08/10/12  Yes Javontae Marlette S Ladaisha Portillo, PA-C  Multiple Vitamins-Minerals (MULTIVITAMIN PO) Take 1 tablet by mouth.   Yes Historical Provider, MD  Omega-3 Fatty Acids (FISH OIL) 1200 MG CAPS Take 1,200 mg by mouth every morning.   Yes Historical Provider, MD  oxyCODONE-acetaminophen (PERCOCET) 7.5-325 MG per tablet Take 1 tablet by mouth every 4 (four) hours as needed for pain. 08/04/12  Yes Nelia Shi, MD    No Known Allergies  History   Social History  . Marital Status: Single    Spouse Name: N/A    Number of Children: 0  . Years of Education: 18.5   Occupational History  . Retired [employer]    [employer] x 30 years  . 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 Smoking status: Never Smoker   . Smokeless tobacco: Never Used  . Alcohol Use: 2.5 - 3.5 oz/week    5-7 drink(s) per week  . Drug Use: No  . Sexually Active: Yes -- Female partner(s)    Birth Control/ Protection: Surgical   Other Topics Concern  . Not on file   Social History Narrative   Close friend/significant other killed (GSW) 2012.Retired after 30 years, and took another job (commutes to [employer] 4 days/week).    Family History  Problem Relation Age of Onset  . Cancer Mother     pancreatic cancer  . Diabetes Mother   . Heart disease Mother     rheumatic heart disease  . COPD Sister   . Sleep apnea Sister        Objective:   Physical Exam Blood pressure 121/78, pulse 91, temperature 98.4 F (36.9 C), resp. rate 16, height 5\' 3"  (1.6 m), weight 233 lb (105.688 kg). Body mass index is 41.27 kg/(m^2). Well-developed, well nourished BF who is awake, alert and oriented, in NAD. HEENT: [place]/AT, sclera and conjunctiva are clear.   Neck: supple, non-tender, no lymphadenopathy, thyromegaly. Lungs: normal effort Extremities: no cyanosis, clubbing or edema. Strong radial pulses. Tenderness of the distal biceps.  Good strength of the hand and wrist  on the left.  Negative Phalen's and Tinel's. Skin: warm and dry without rash. Psychologic: good mood and appropriate affect, normal speech and behavior.     Assessment & Plan:   1. Arm pain  MR Cervical Spine Wo Contrast  2. Paresthesia  MR Cervical Spine Wo Contrast  3. Cervical arthritis  MR Cervical Spine Wo Contrast   Continue meloxicam and cyclobenzaprine, and prn oxycodone.  Arrange follow-up with results of the MRI.

## 2012-09-07 ENCOUNTER — Ambulatory Visit
Admission: RE | Admit: 2012-09-07 | Discharge: 2012-09-07 | Disposition: A | Discharge status: Home / Self Care | Payer: BC Managed Care – PPO | Source: Ambulatory Visit | Attending: Physician Assistant | Admitting: Physician Assistant

## 2012-09-07 DIAGNOSIS — M47812 Spondylosis without myelopathy or radiculopathy, cervical region: Secondary | ICD-10-CM

## 2012-09-07 DIAGNOSIS — M79603 Pain in arm, unspecified: Secondary | ICD-10-CM

## 2012-09-07 DIAGNOSIS — R202 Paresthesia of skin: Secondary | ICD-10-CM

## 2012-09-13 ENCOUNTER — Telehealth: Payer: Self-pay

## 2012-09-13 DIAGNOSIS — M542 Cervicalgia: Secondary | ICD-10-CM

## 2012-09-13 DIAGNOSIS — E049 Nontoxic goiter, unspecified: Secondary | ICD-10-CM

## 2012-09-13 NOTE — Telephone Encounter (Signed)
Chelle,   Patient would like a call regarding her recent MRI report.   [phone]

## 2012-09-13 NOTE — Telephone Encounter (Signed)
I spoke to [facility] and called patient. She needs US thyroid and referral to Dr Otelia Sergeant

## 2012-09-13 NOTE — Telephone Encounter (Signed)
MRI report on your computer, please advise, I can call patient, multilevel spondylitic changes. Please advise what you want me to advise her.

## 2012-09-18 ENCOUNTER — Ambulatory Visit
Admission: RE | Admit: 2012-09-18 | Discharge: 2012-09-18 | Disposition: A | Discharge status: Home / Self Care | Payer: BC Managed Care – PPO | Source: Ambulatory Visit | Attending: Physician Assistant | Admitting: Physician Assistant

## 2012-09-18 DIAGNOSIS — E049 Nontoxic goiter, unspecified: Secondary | ICD-10-CM

## 2012-10-01 ENCOUNTER — Telehealth: Payer: Self-pay | Admitting: Physician Assistant

## 2012-10-01 NOTE — Telephone Encounter (Signed)
Left message for patient to return call.  I reviewed the record, and she also needs a TSH, so if she'd like, she can come in to see me, talk, and get the labs done all at once.

## 2012-10-01 NOTE — Telephone Encounter (Signed)
Message copied by Fernande Bras on Sun Oct 01, 2012 12:21 PM ------      Message from: Marinus Maw      Created: Thu Sep 28, 2012 10:03 AM       Spoke with pt and went over results, informed her of your recommendation.  She then started becoming very worried about the issue and wanted to know how this would effect her at her age, is this the reason she hasn't been able to lose weight, what does this mean for her in the long run, etc.  Patient wanted more information than I could explain to her.  I advised her that I would give this message directly to Kervens Roper. She would like for you to personally contact her so she can have clarity on this.  Thank you!

## 2012-10-02 NOTE — Telephone Encounter (Signed)
Advised pt of Chelle's recommendation to RTC to talk w/her about thyroid nodules and to have a TSH done. Pt agreed and I gave her Chelle's schedule this wk.

## 2012-11-14 ENCOUNTER — Telehealth: Payer: Self-pay

## 2012-11-14 NOTE — Telephone Encounter (Signed)
Chelle do you want to try to call pt again or mail instructions. Please advise

## 2012-11-14 NOTE — Telephone Encounter (Signed)
PT CALLED TO SCHEDULE MRI F/U APPT WITH CHELLE HOWEVER WITH PTS WORK SCHEDULE CHELLE'S APPOINTMENT SCHEDULE DOES NOT WORK FOR PT, PT TRIED TO COME TO [facility] BUT WAIT WAS 3 HOURS. PT WOULD LIKE MRI RESULTS, DIAGNOSIS, AND TREATMENT OPTIONS SENT TO HER IN WRITING BEING THE NURSE THAT CALLED HER COULD NOT ANSWER HER QUESTIONS AND WHEN A MESSAGE WAS LEFT FOR CHELLE, THE PATIENT AND CHELLE PLAYED PHONE TAG AND PT COULD NEVER GET IN [facility] WITH PT   PLEASE ADVISE   PT PH# [phone]   Rebecca [name]  [address]  [place], [place] [address]

## 2012-11-21 NOTE — Telephone Encounter (Signed)
Please call Ms. Coscia.  I received her message, and want to respond to her with good information that is detailed and accurate.  I will have her letter by Next Tuesday ( hopefully sooner), and don't want to think that I' didn't receive her message.

## 2012-11-22 NOTE — Telephone Encounter (Signed)
LMOM for pt w/Chelle's message and asked for CB and ask for me if pt has any other ?s before that.

## 2012-11-28 ENCOUNTER — Encounter: Payer: Self-pay | Admitting: Physician Assistant

## 2012-12-08 ENCOUNTER — Telehealth: Payer: Self-pay | Admitting: Radiology

## 2012-12-08 NOTE — Telephone Encounter (Signed)
Still have had difficulty contacting patient, letter was sent still no response will continue to wait for response. Left another message.

## 2012-12-21 ENCOUNTER — Ambulatory Visit: Payer: BC Managed Care – PPO | Admitting: Physician Assistant

## 2013-01-11 ENCOUNTER — Ambulatory Visit (INDEPENDENT_AMBULATORY_CARE_PROVIDER_SITE_OTHER): Payer: BC Managed Care – PPO | Admitting: Physician Assistant

## 2013-01-11 ENCOUNTER — Encounter: Payer: Self-pay | Admitting: Physician Assistant

## 2013-01-11 VITALS — BP 150/80 | HR 89 | Temp 98.3°F | Resp 16 | Ht 63.0 in | Wt 239.0 lb

## 2013-01-11 DIAGNOSIS — M4802 Spinal stenosis, cervical region: Secondary | ICD-10-CM

## 2013-01-11 DIAGNOSIS — M6281 Muscle weakness (generalized): Secondary | ICD-10-CM

## 2013-01-11 DIAGNOSIS — E669 Obesity, unspecified: Secondary | ICD-10-CM

## 2013-01-11 DIAGNOSIS — E042 Nontoxic multinodular goiter: Secondary | ICD-10-CM

## 2013-01-11 DIAGNOSIS — R209 Unspecified disturbances of skin sensation: Secondary | ICD-10-CM

## 2013-01-11 DIAGNOSIS — R29898 Other symptoms and signs involving the musculoskeletal system: Secondary | ICD-10-CM

## 2013-01-11 DIAGNOSIS — R202 Paresthesia of skin: Secondary | ICD-10-CM

## 2013-01-11 LAB — COMPREHENSIVE METABOLIC PANEL
ALT: 9 U/L (ref 0–35)
Alkaline Phosphatase: 80 U/L (ref 39–117)
CO2: 26 mEq/L (ref 19–32)
Potassium: 3.7 mEq/L (ref 3.5–5.3)
Sodium: 141 mEq/L (ref 135–145)
Total Bilirubin: 0.3 mg/dL (ref 0.3–1.2)
Total Protein: 7.8 g/dL (ref 6.0–8.3)

## 2013-01-11 LAB — CBC WITH DIFFERENTIAL/PLATELET
Basophils Relative: 0 % (ref 0–1)
Eosinophils Absolute: 0.3 10*3/uL (ref 0.0–0.7)
Eosinophils Relative: 3 % (ref 0–5)
Lymphs Abs: 3.1 10*3/uL (ref 0.7–4.0)
MCH: 29.1 pg (ref 26.0–34.0)
MCHC: 34.4 g/dL (ref 30.0–36.0)
MCV: 84.5 fL (ref 78.0–100.0)
Platelets: 260 10*3/uL (ref 150–400)
RBC: 4.4 MIL/uL (ref 3.87–5.11)

## 2013-01-11 LAB — LIPID PANEL
Total CHOL/HDL Ratio: 6 Ratio
VLDL: 80 mg/dL — ABNORMAL HIGH (ref 0–40)

## 2013-01-11 LAB — TSH: TSH: 1.931 u[IU]/mL (ref 0.350–4.500)

## 2013-01-11 NOTE — Patient Instructions (Signed)
Exercise 45-60 minutes Friday, Saturday and Sunday.  Exercise one other day, while you're at work.

## 2013-01-12 NOTE — Progress Notes (Signed)
Subjective:    Patient ID: Rebecca [name], female    DOB: [birth year], 57 y.o.   MRN: [id number]  HPI This 57 y.o. female presents to discuss the results of an MRI of her neck performed 08/2012.  The MRI revealed a thyroid nodule and a thyroid US has also been completed. She did not feel comfortable with the report by phone, and has difficulty scheduling an appointment with me.  The MRI was performed due to neck pain and paresthesias and weakness in her arms/hands.  She has seen orthopedics for what sounds like carpal tunnel syndrome treatments which have significantly improved the symptoms on the RIGHT, and are now focused primarily on the LEFT.  She continues to experience neck pain, made worse by jostling (especially when she rides in the back of the [place] she takes to [facility] for her work).  Meloxicam and flexeril have been only minimally helpful.  She complains of frustration with her weight, her inability to lose.  Previously she attended a weight loss clinic that employed phentermine, but she left when they were concerned that she was building lean muscle by exercising and so wasn't losing the pounds they expected.  Now with her job in [place], she has much less time to exercise, and is very tired, which makes it difficult to stay motivated.  She joined a [employer], but has only gone a few times in the last month. 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  She gets to her desk on the [employer] campus by 7:15 am.  She gets home each day around 6 pm. She gets in bed by 9 pm, but is usually not asleep until 12 midnight. She works from home on Fridays, and has weekends off.  Review of Systems No chest pain, SOB, HA, dizziness, vision change, N/V, diarrhea, constipation, dysuria, urinary urgency or frequency, or rash.     Objective:   Physical Exam Blood pressure 150/80, pulse 89, temperature 98.3 F (36.8 C),  temperature source Oral, resp. rate 16, height 5\' 3"  (1.6 m), weight 239 lb (108.41 kg), SpO2 97.00%. Body mass index is 42.35 kg/(m^2). Well-developed, well nourished BF who is awake, alert and oriented, in NAD. HEENT: [place]/AT, sclera and conjunctiva are clear.   Neck: supple, non-tender, no lymphadenopathy, thyromegaly. No tenderness of the cervical spine or paraspinous muscles.  FROM of the cspine. Heart: RRR, no murmur Lungs: normal effort, CTA Extremities: no cyanosis, clubbing or edema. Skin: warm and dry without rash. Psychologic: good mood and appropriate affect, normal speech and behavior.  MRI C-spine: 09/07/2012 Cervical spondylotic changes most notable C3-4 through C5-6. Ossification of the posterior longitudinal ligament not excluded.  Correlation with plain film or CT can be performed for further  delineation if clinically desired.  6 mm left lobe of the thyroid nodule.  Prominence of palatine tonsils bilaterally (slightly greater on the  right). This is incompletely assessed on present exam and possibly  normal for this patient. Direct visualization would be necessary to  exclude mucosal abnormality if clinically desired.  Thyroid US: 09/28/2012 Several small thyroid nodules. The largest nodule is primarily  cystic measuring 1.1 cm in diameter on the right.     Assessment & Plan:  Spinal stenosis in cervical region - Plan: Ambulatory referral to Neurosurgery  Paresthesias - Plan: Ambulatory referral to Neurosurgery, CBC with Differential, TSH, Comprehensive metabolic panel  Weakness of left hand -  Plan: Ambulatory referral to Neurosurgery  Multiple thyroid nodules - Plan: Ambulatory referral to Endocrinology, TSH, Comprehensive metabolic panel  Obesity, unspecified - Plan: Lipid panel, TSH, Ambulatory referral to Nutrition and Diabetic Education

## 2013-01-16 ENCOUNTER — Encounter: Payer: Self-pay | Admitting: Physician Assistant

## 2013-01-16 DIAGNOSIS — E669 Obesity, unspecified: Secondary | ICD-10-CM | POA: Insufficient documentation

## 2013-02-22 ENCOUNTER — Telehealth: Payer: Self-pay

## 2013-02-22 ENCOUNTER — Telehealth: Payer: Self-pay | Admitting: Radiology

## 2013-02-22 ENCOUNTER — Other Ambulatory Visit: Payer: Self-pay | Admitting: Endocrinology

## 2013-02-22 DIAGNOSIS — E049 Nontoxic goiter, unspecified: Secondary | ICD-10-CM

## 2013-02-22 NOTE — Telephone Encounter (Signed)
[facility]  called needs MRI & THYROID US reports faxed to [phone] They did not received from prior request.

## 2013-02-22 NOTE — Telephone Encounter (Signed)
Patients [place] and MRI faxed to [facility], we have referred for thyroid nodule

## 2013-02-23 NOTE — Telephone Encounter (Signed)
Faxed over reports through epic.

## 2013-03-15 ENCOUNTER — Ambulatory Visit (INDEPENDENT_AMBULATORY_CARE_PROVIDER_SITE_OTHER): Payer: BC Managed Care – PPO | Admitting: Physician Assistant

## 2013-03-15 ENCOUNTER — Encounter: Payer: Self-pay | Admitting: Physician Assistant

## 2013-03-15 VITALS — BP 118/66 | HR 80 | Temp 97.9°F | Resp 18 | Ht 63.0 in | Wt 232.0 lb

## 2013-03-15 DIAGNOSIS — E785 Hyperlipidemia, unspecified: Secondary | ICD-10-CM

## 2013-03-15 DIAGNOSIS — I10 Essential (primary) hypertension: Secondary | ICD-10-CM

## 2013-03-15 DIAGNOSIS — E669 Obesity, unspecified: Secondary | ICD-10-CM

## 2013-03-15 DIAGNOSIS — E042 Nontoxic multinodular goiter: Secondary | ICD-10-CM

## 2013-03-15 DIAGNOSIS — R209 Unspecified disturbances of skin sensation: Secondary | ICD-10-CM

## 2013-03-15 DIAGNOSIS — R202 Paresthesia of skin: Secondary | ICD-10-CM

## 2013-03-15 DIAGNOSIS — E049 Nontoxic goiter, unspecified: Secondary | ICD-10-CM

## 2013-03-15 DIAGNOSIS — M4802 Spinal stenosis, cervical region: Secondary | ICD-10-CM

## 2013-03-15 NOTE — Patient Instructions (Signed)
SEE IF YOU CAN HAVE YOUR CHOLESTEROL CHECKED WHEN YOU HAVE YOUR LABS DRAWN FOR DR. BALAN.

## 2013-03-15 NOTE — Progress Notes (Signed)
  Subjective:    Patient ID: Rebecca [name], female    DOB: Jul 24, 1956, 57 y.o.   MRN: [id number]  HPI This 57 y.o. female presents for follow-up after recent evaluations with neurosurgery and endocrinology.  She reports that the neurosurgeon "wasn't concerned" about the spinal stenosis and RIGHT arm radiculopathy.  She reports that she was given anti-inflammatories to take for 3 weeks, but didn't take them, so didn't follow-up.  She reports she was told there was no need for other treatment, PT or surgery, and is happy with the plan.  Dr. Talmage Nap started her on levothyroxine, and is to return for fasting labs 04/27/2013, and then follow-up with Dr. Talmage Nap in 6 months.  They will monitor the small thyroid nodules for now.  Comments that she's frustrated with her weight, and that she thinks the main problem is her alcohol consumption. Drinks wine and honey brandy in the evenings to wind down from her stressful day. 2 drinks Wednesday and Thursday evenings.  Occasionally 1-2 on Fridays. Sometimes 3-4 drinks on Saturdays.  Rarely on Sundays. Denies drinking for stress relief, or "needing" a drink.  "My life is busy.  Very social."  Line Dancing-competition coming up.  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  Past medical history, surgical history, family history, social history and problem list reviewed.  Review of Systems No chest pain, SOB, HA, dizziness, vision change, N/V, diarrhea, constipation, dysuria, urinary urgency or frequency, or rash.     Objective:   Physical Exam Blood pressure 118/66, pulse 80, temperature 97.9 F (36.6 C), resp. rate 18, height 5\' 3"  (1.6 m), weight 232 lb (105.235 kg). Body mass index is 41.11 kg/(m^2). Well-developed, well nourished BF who is awake, alert and oriented, in NAD. HEENT: [place]/AT, PERRL, EOMI.  Sclera and conjunctiva are clear.  EAC are patent, TMs are normal in appearance. Nasal mucosa is pink and moist. OP is clear. Neck: supple, non-tender, no  lymphadenopathy, thyromegaly. Heart: RRR, no murmur Lungs: normal effort, CTA Extremities: no cyanosis, clubbing or edema. Skin: warm and dry without rash. Psychologic: good mood and appropriate affect, normal speech and behavior.     Assessment & Plan:  Spinal stenosis in cervical region/Paresthesias - follow-up with neurosurgery, or here, as needed for worsening symptoms.  Multiple thyroid nodules/Thyroid enlargement - follow-up with Dr. Talmage Nap per her instructions.  Hyperlipidemia - we'll try to get lipids added to the labs drawn when she has the thyroid rechecked for Dr. Talmage Nap.  HTN (hypertension) - controlled  Obesity - continue physical activity and healthy eating (and drinking) choices.  Fernande Bras, PA-C Physician Assistant-Certified [facility]

## 2013-03-16 ENCOUNTER — Encounter: Payer: Self-pay | Admitting: Physician Assistant

## 2013-04-27 ENCOUNTER — Ambulatory Visit (INDEPENDENT_AMBULATORY_CARE_PROVIDER_SITE_OTHER): Payer: BC Managed Care – PPO | Admitting: Family Medicine

## 2013-04-27 ENCOUNTER — Ambulatory Visit: Payer: BC Managed Care – PPO

## 2013-04-27 DIAGNOSIS — IMO0002 Reserved for concepts with insufficient information to code with codable children: Secondary | ICD-10-CM

## 2013-04-27 DIAGNOSIS — M79609 Pain in unspecified limb: Secondary | ICD-10-CM

## 2013-04-27 DIAGNOSIS — Z23 Encounter for immunization: Secondary | ICD-10-CM

## 2013-04-27 DIAGNOSIS — S90851S Superficial foreign body, right foot, sequela: Secondary | ICD-10-CM

## 2013-04-27 MED ORDER — TETANUS-DIPHTHERIA TOXOIDS TD 5-2 LFU IM INJ: 0.5000 mL | INJECTION | Freq: Once | INTRAMUSCULAR | Status: DC

## 2013-04-27 NOTE — Progress Notes (Signed)
[facility] [address] [place] [address] [phone]  Date:  04/27/2013   Name:  Rebecca [name]   DOB:  Jan 06, 1956   MRN:  [id number]  PCP:  JEFFERY,CHELLE, PA-C    Chief Complaint: Foot Pain   History of Present Illness:  Rebecca [name] is a 57 y.o. very pleasant female patient who presents with the following:  Possible FB in foot- she stepped on glass about 3 weeks ago.  Got some glass in her RIGHT foot- she pulled some out and throught she got it all, but it seems that some remains because it continues to bother her.  She feels like there is still something in her foot, and she cannot get it out.  The area is uncomfortable and sometimes painful  She does not have DM  Otherwise unhurt and well today   Her last tetanus shot was done in 2003 per her recollection- she is due for a booster today.   Patient Active Problem List   Diagnosis Date Noted  . Obesity, unspecified 01/16/2013  . Spinal stenosis in cervical region 01/11/2013  . Multiple thyroid nodules 01/11/2013  . Hand pain 06/16/2012  . Hyperlipidemia 06/16/2012    Past Medical History  Diagnosis Date  . Hyperlipidemia   . Obesity   . Thyroid disease     Past Surgical History  Procedure Laterality Date  . Abdominal hysterectomy      History  Substance Use Topics  . Smoking status: Never Smoker   . Smokeless tobacco: Never Used  . Alcohol Use: 2.5 - 3.5 oz/week    5-7 drink(s) per week     Comment: 4-5 * week/ 1-2 drinks    Family History  Problem Relation Age of Onset  . Cancer Mother     pancreatic cancer  . Diabetes Mother   . Heart disease Mother     rheumatic heart disease  . Hyperlipidemia Mother   . COPD Sister   . Sleep apnea Sister   . Cancer Sister     Lung Cancer  . Cancer Brother     lung cancer  . Cancer Father     renal, bone  . Diabetes Father   . Hyperlipidemia Father   . Heart attack Maternal Grandfather     No Known  Allergies  Medication list has been reviewed and updated.  Current Outpatient Prescriptions on File Prior to Visit  Medication Sig Dispense Refill  . levothyroxine (SYNTHROID, LEVOTHROID) 25 MCG tablet Take 25 mcg by mouth daily before breakfast.      . Multiple Vitamins-Minerals (MULTIVITAMIN PO) Take 1 tablet by mouth.      . Omega-3 Fatty Acids (FISH OIL) 1200 MG CAPS Take 1,200 mg by mouth every morning.      . cyclobenzaprine (FLEXERIL) 10 MG tablet Take 1 tablet (10 mg total) by mouth 3 (three) times daily as needed for muscle spasms.  30 tablet  0  . meloxicam (MOBIC) 15 MG tablet Take 1 tablet (15 mg total) by mouth daily.  30 tablet  1  . oxyCODONE-acetaminophen (PERCOCET) 7.5-325 MG per tablet Take 1 tablet by mouth every 4 (four) hours as needed for pain.  30 tablet  0   No current facility-administered medications on file prior to visit.    Review of Systems:  As per HPI- otherwise negative.   Physical Examination: Filed Vitals:   04/27/13 0924  BP: 138/88  Pulse: 77  Temp: 98 F (36.7 C)  Resp: 18   Filed Vitals:   04/27/13 0924  Height: 5\' 3"  (1.6 m)  Weight: 232 lb (105.235 kg)   Body mass index is 41.11 kg/(m^2). Ideal Body Weight: Weight in (lb) to have BMI = 25: 140.8  GEN: WDWN, NAD, Non-toxic, A & O x 3, obese HEENT: Atraumatic, Normocephalic. Neck supple. No masses, No LAD. Ears and Nose: No external deformity. CV: RRR, No M/G/R. No JVD. No thrill. No extra heart sounds. PULM: CTA B, no wheezes, crackles, rhonchi. No retractions. No resp. distress. No accessory muscle use. EXTR: No c/c/e NEURO Normal gait.  PSYCH: Normally interactive. Conversant. Not depressed or anxious appearing.  Calm demeanor.  Sole of right foot: there is no visible FB, but she has pared away the callus from her sole in an area on the ball of the foot, between the 2nd and 3rd MT.  There is no other abnormality of the foot or ankle, no evidence of infection  [facility] reading  (PRIMARY) by  Dr. Patsy Lager. Right foot- negative RIGHT FOOT - 2 VIEW  Comparison: None.  Findings: No acute bony abnormality. Specifically, no fracture, subluxation, or dislocation. Soft tissues are intact. Joint spaces are maintained. Normal bone mineralization. No radiopaque foreign bodies.  IMPRESSION: No bony abnormality or radiopaque foreign body.  Clinically significant discrepancy from primary report, if provided: None     See procedure note per Frances Furbish, PA-C.  Small piece of glass removed from the sole of her right foot.   Assessment and Plan: Pain, foot, right  Foreign body in foot, right, sequela - Plan: DG Foot 2 Views Right, tetanus & diphtheria toxoids (adult) (TENIVAC) injection 0.5 mL    Signed Abbe Amsterdam, MD

## 2013-04-27 NOTE — Progress Notes (Signed)
Procedure Note: Verbal consent obtained from the patient.  Local anesthesia with 0.5cc 2% plain lidocaine.  Betadine prep.  Calloused tissue gently debrided with 15 blade.  Small piece of glass easily palpated with splinter forceps.  Glass removed.  Area gently explored revealing no other foreign body.  Area cleansed and dressed.  Pt tolerated very well.

## 2013-04-27 NOTE — Patient Instructions (Addendum)
You were given a tetanus booster today.  Please let us know if you continue to have problems with your foot, or if you see any sign of infection such as redness, pus, or increasing pain.

## 2013-05-07 ENCOUNTER — Ambulatory Visit (INDEPENDENT_AMBULATORY_CARE_PROVIDER_SITE_OTHER): Payer: BC Managed Care – PPO | Admitting: Family Medicine

## 2013-05-07 VITALS — BP 144/81 | HR 68 | Temp 98.0°F | Resp 17 | Ht 63.0 in | Wt 229.0 lb

## 2013-05-07 DIAGNOSIS — J069 Acute upper respiratory infection, unspecified: Secondary | ICD-10-CM

## 2013-05-07 DIAGNOSIS — R21 Rash and other nonspecific skin eruption: Secondary | ICD-10-CM

## 2013-05-07 MED ORDER — DOXYCYCLINE HYCLATE 100 MG PO TABS: 100.0000 mg | ORAL_TABLET | Freq: Two times a day (BID) | ORAL | Status: DC

## 2013-05-07 NOTE — Progress Notes (Signed)
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  She has gotten run down.  She's been at the job x 18 months and works in [place] commuting.  She complains of 2 weeks with myalgia, aches, sinus congestion, and cough.  Not improving on mucinex.  Objective:  Alert and articulate, NAD  Skin:  Multiple erythem papules on abdomen Chest:  Few ronchi Heart:  Reg, no murmur HEENT:  Mild pharyngeal erythema,  Tm's normal, Neck: supple, no adenopathy  Assessment:  URI, abdominal rash  Plan:  Doxycycline. URI (upper respiratory infection) - Plan: doxycycline (VIBRA-TABS) 100 MG tablet  Rash and nonspecific skin eruption - Plan: doxycycline (VIBRA-TABS) 100 MG tablet  Signed, Elvina Sidle, MD

## 2013-05-28 ENCOUNTER — Ambulatory Visit (INDEPENDENT_AMBULATORY_CARE_PROVIDER_SITE_OTHER): Payer: BC Managed Care – PPO | Admitting: Family Medicine

## 2013-05-28 ENCOUNTER — Ambulatory Visit: Payer: BC Managed Care – PPO

## 2013-05-28 VITALS — BP 128/82 | HR 71 | Temp 97.8°F | Resp 18 | Ht 63.0 in | Wt 234.2 lb

## 2013-05-28 DIAGNOSIS — R5383 Other fatigue: Secondary | ICD-10-CM

## 2013-05-28 DIAGNOSIS — J209 Acute bronchitis, unspecified: Secondary | ICD-10-CM

## 2013-05-28 DIAGNOSIS — R5381 Other malaise: Secondary | ICD-10-CM

## 2013-05-28 DIAGNOSIS — E119 Type 2 diabetes mellitus without complications: Secondary | ICD-10-CM

## 2013-05-28 DIAGNOSIS — R05 Cough: Secondary | ICD-10-CM

## 2013-05-28 DIAGNOSIS — E1059 Type 1 diabetes mellitus with other circulatory complications: Secondary | ICD-10-CM

## 2013-05-28 DIAGNOSIS — R059 Cough, unspecified: Secondary | ICD-10-CM

## 2013-05-28 LAB — POCT URINALYSIS DIPSTICK
Bilirubin, UA: NEGATIVE
Glucose, UA: NEGATIVE
Ketones, UA: NEGATIVE
Leukocytes, UA: NEGATIVE
Nitrite, UA: NEGATIVE
Protein, UA: 100
Spec Grav, UA: >=1.03
Urobilinogen, UA: 0.2
pH, UA: 5

## 2013-05-28 LAB — POCT SEDIMENTATION RATE: POCT SED RATE: 60 mm/hr — AB (ref 0–22)

## 2013-05-28 LAB — POCT UA - MICROSCOPIC ONLY
Casts, Ur, LPF, POC: NEGATIVE
Crystals, Ur, HPF, POC: NEGATIVE
Mucus, UA: NEGATIVE
WBC, Ur, HPF, POC: NEGATIVE
Yeast, UA: NEGATIVE

## 2013-05-28 LAB — POCT CBC
Granulocyte percent: 54.8 %G (ref 37–80)
HCT, POC: 43.1 % (ref 37.7–47.9)
Hemoglobin: 13.2 g/dL (ref 12.2–16.2)
Lymph, poc: 4.6 — AB (ref 0.6–3.4)
MCH, POC: 28.8 pg (ref 27–31.2)
MCHC: 30.6 g/dL — AB (ref 31.8–35.4)
MCV: 94 fL (ref 80–97)
MID (cbc): 0.8 (ref 0–0.9)
MPV: 10.8 fL (ref 0–99.8)
POC Granulocyte: 6.5 (ref 2–6.9)
POC LYMPH PERCENT: 38.8 %L (ref 10–50)
POC MID %: 6.4 %M (ref 0–12)
Platelet Count, POC: 250 10*3/uL (ref 142–424)
RBC: 4.58 M/uL (ref 4.04–5.48)
RDW, POC: 14.6 %
WBC: 11.9 10*3/uL — AB (ref 4.6–10.2)

## 2013-05-28 LAB — GLUCOSE, POCT (MANUAL RESULT ENTRY): POC Glucose: 78 mg/dl (ref 70–99)

## 2013-05-28 MED ORDER — LEVOFLOXACIN 500 MG PO TABS: 500.0000 mg | ORAL_TABLET | Freq: Every day | ORAL | Status: DC

## 2013-05-28 NOTE — Progress Notes (Signed)
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 She's been getting progressively more tired over the last couple months as well as having a loose cough which worsens at night. She was seen several weeks ago and was given a course of doxycycline with expectation that she would be feeling better by now. Instead, cough persists as well as the fatigue. Patient states that she got nausea and actually vomited some of her doxycycline so she feels she never got a good dose.  She sees Dr. Cleon Gustin for diabetes (early diabetes) and has been told to try to exercise and keep her weight from increasing in more. At the time she saw Dr. Cleon Gustin, patient's thyroid was also checked and it was normal.  Objective: No acute distress, obese woman. HEENT: Unremarkable Neck: Supple no adenopathy or thyromegaly Chest: Clear to auscultation Heart: Regular, no murmur, gallop, or click Abdomen: Soft nontender without HSM-exam hindered by obesity Skin: Without rash or obvious abnormality Extremities: Good pedal pulses, no edema Neurologically: Intact cranial nerves, moving 4 extremities equally, no muscle mass abnormalities  [facility] reading (PRIMARY) by  Dr. Milus Glazier:  CXR:  NAD Results for orders placed in visit on 05/28/13  POCT CBC      Result Value Range   WBC 11.9 (*) 4.6 - 10.2 K/uL   Lymph, poc 4.6 (*) 0.6 - 3.4   POC LYMPH PERCENT 38.8  10 - 50 %L   MID (cbc) 0.8  0 - 0.9   POC MID % 6.4  0 - 12 %M   POC Granulocyte 6.5  2 - 6.9   Granulocyte percent 54.8  37 - 80 %G   RBC 4.58  4.04 - 5.48 M/uL   Hemoglobin 13.2  12.2 - 16.2 g/dL   HCT, POC 16.1  09.6 - 47.9 %   MCV 94.0  80 - 97 fL   MCH, POC 28.8  27 - 31.2 pg   MCHC 30.6 (*) 31.8 - 35.4 g/dL   RDW, POC 04.5     Platelet Count, POC 250  142 - 424 K/uL   MPV 10.8  0 - 99.8 fL  GLUCOSE, POCT (MANUAL RESULT ENTRY)      Result Value Range   POC Glucose 78  70 - 99 mg/dl  POCT UA - MICROSCOPIC ONLY      Result Value Range   WBC, Ur, HPF, POC neg      RBC, urine, microscopic 0-2     Bacteria, U Microscopic trace     Mucus, UA neg     Epithelial cells, urine per micros TNTC     Crystals, Ur, HPF, POC neg     Casts, Ur, LPF, POC neg     Yeast, UA neg    POCT URINALYSIS DIPSTICK      Result Value Range   Color, UA yellow     Clarity, UA clear     Glucose, UA neg     Bilirubin, UA neg     Ketones, UA neg     Spec Grav, UA >=1.030     Blood, UA small     pH, UA 5.0     Protein, UA 100     Urobilinogen, UA 0.2     Nitrite, UA neg     Leukocytes, UA Negative     .  Assessment: Patient may actually have a sleep apnea syndrome. She was to make sure she didn't have pneumonia. I encouraged her to consider sleep study. Patient does have  a look leukocytosis which suggests that there is some element of infection  Plan: Levaquin 500 daily x7, Patient told to call back if she's not better in 10 days and will go ahead with a sleep study.  Signed, Elvina Sidle in the

## 2013-05-29 LAB — COMPREHENSIVE METABOLIC PANEL
ALT: 13 U/L (ref 0–35)
AST: 19 U/L (ref 0–37)
Albumin: 4.4 g/dL (ref 3.5–5.2)
Alkaline Phosphatase: 89 U/L (ref 39–117)
BUN: 14 mg/dL (ref 6–23)
CO2: 25 mEq/L (ref 19–32)
Calcium: 9.9 mg/dL (ref 8.4–10.5)
Chloride: 105 mEq/L (ref 96–112)
Creat: 0.61 mg/dL (ref 0.50–1.10)
Glucose, Bld: 90 mg/dL (ref 70–99)
Potassium: 4.2 mEq/L (ref 3.5–5.3)
Sodium: 138 mEq/L (ref 135–145)
Total Bilirubin: 0.4 mg/dL (ref 0.3–1.2)
Total Protein: 7.8 g/dL (ref 6.0–8.3)

## 2013-07-19 ENCOUNTER — Ambulatory Visit (INDEPENDENT_AMBULATORY_CARE_PROVIDER_SITE_OTHER): Payer: BC Managed Care – PPO | Admitting: Physician Assistant

## 2013-07-19 ENCOUNTER — Encounter: Payer: Self-pay | Admitting: Physician Assistant

## 2013-07-19 VITALS — BP 136/80 | HR 73 | Temp 98.3°F | Resp 18 | Ht 62.75 in | Wt 240.2 lb

## 2013-07-19 DIAGNOSIS — R0609 Other forms of dyspnea: Secondary | ICD-10-CM

## 2013-07-19 DIAGNOSIS — R0683 Snoring: Secondary | ICD-10-CM

## 2013-07-19 DIAGNOSIS — R5383 Other fatigue: Secondary | ICD-10-CM

## 2013-07-19 DIAGNOSIS — Z1211 Encounter for screening for malignant neoplasm of colon: Secondary | ICD-10-CM

## 2013-07-19 DIAGNOSIS — R5381 Other malaise: Secondary | ICD-10-CM

## 2013-07-19 DIAGNOSIS — R197 Diarrhea, unspecified: Secondary | ICD-10-CM

## 2013-07-19 DIAGNOSIS — E785 Hyperlipidemia, unspecified: Secondary | ICD-10-CM

## 2013-07-19 DIAGNOSIS — R05 Cough: Secondary | ICD-10-CM

## 2013-07-19 DIAGNOSIS — I10 Essential (primary) hypertension: Secondary | ICD-10-CM

## 2013-07-19 DIAGNOSIS — R059 Cough, unspecified: Secondary | ICD-10-CM

## 2013-07-19 DIAGNOSIS — K5900 Constipation, unspecified: Secondary | ICD-10-CM

## 2013-07-19 NOTE — Patient Instructions (Signed)
Call the main number at [facility] to find out about the next bariatric surgery class/seminar.

## 2013-07-23 NOTE — Progress Notes (Signed)
  Subjective:    Patient ID: Rebecca [name], female    DOB: 1956/06/30, 57 y.o.   MRN: [id number]  HPI This 57 y.o. female presents for evaluation of HTN, hyperlipidemia, obesity.  Overall, her situation is stable, though she's in the process of figuring out how to change her life to improve her sleep and increase the available time for exercise.  She currently works in [place], [place] M-Th, and works from home on Fridays.  She get's up at 3:30 am and drive to [place], [place] to catch the bus for [place].  At the end of the day, she does this in reverse, getting back home about 6:30 pm.  She has requested to work from home an additional day each week, but was denied.  She's hoping to get assigned to a different position which would keep her closer to home and reduce her travel time.  At present, this is the most stressful, frustrating thing going on.  Dr. Talmage Nap checks her labs, and their next visit is in 09/2013.  Medications, allergies, past medical history, surgical history, family history, social history and problem list reviewed.   Review of Systems Fatigue.  Chronic constipation, intermittent diarrhea. She's overdue for screening colonoscopy.  Cough (residual after treatment x 2 with antibiotics). Neck/arm pain is baseline and exacerbated by jostling in the [place]. No chest pain, SOB, HA, dizziness, vision change, N/V, diarrhea, dysuria, urinary urgency or frequency or rash. She snores loudly, and in the past has been told that she stopped breathing briefly.     Objective:   Physical Exam Blood pressure 136/80, pulse 73, temperature 98.3 F (36.8 C), temperature source Oral, resp. rate 18, height 5' 2.75" (1.594 m), weight 240 lb 3.2 oz (108.954 kg), SpO2 100.00%. Body mass index is 42.88 kg/(m^2). Well-developed, well nourished BF who is awake, alert and oriented, in NAD. HEENT: [place]/AT, sclera and conjunctiva are clear.   Neck: supple, non-tender, no lymphadenopathy; thyromegaly is  noted. Heart: RRR, no murmur Lungs: normal effort, CTA Extremities: no cyanosis, clubbing or edema. Skin: warm and dry without rash. Psychologic: good mood and appropriate affect, normal speech and behavior.        Assessment & Plan:  Cough  HTN (hypertension)  Hyperlipidemia  Snoring - Plan: Ambulatory referral to Sleep Studies  Fatigue  Screening for colon cancer - Plan: Ambulatory referral to Gastroenterology  Unspecified constipation - Plan: Ambulatory referral to Gastroenterology  Diarrhea - Plan: Ambulatory referral to Gastroenterology  Fernande Bras, PA-C Physician Assistant-Certified [facility]

## 2013-07-27 ENCOUNTER — Ambulatory Visit (INDEPENDENT_AMBULATORY_CARE_PROVIDER_SITE_OTHER): Payer: BC Managed Care – PPO | Admitting: Neurology

## 2013-07-27 ENCOUNTER — Encounter: Payer: Self-pay | Admitting: Neurology

## 2013-07-27 VITALS — BP 125/80 | HR 76 | Temp 98.5°F | Ht 62.25 in | Wt 237.0 lb

## 2013-07-27 DIAGNOSIS — G4733 Obstructive sleep apnea (adult) (pediatric): Secondary | ICD-10-CM

## 2013-07-27 NOTE — Progress Notes (Signed)
Subjective:    Patient ID: Rebecca [name] is a 57 y.o. female.  HPI  Huston Foley, MD, PhD [facility] [address] [address] [place], [place] [address]  Dear Avelino Leeds,   I saw your patient, Rebecca [name], upon your kind request in my neurologic clinic today for initial consultation of her sleep disorder, concern for obstructive sleep apnea. The patient is unaccompanied today. As you know, Rebecca [name], Rebecca [name], Rebecca [name], who has been complaining of daytime tiredness Rebecca snoring.  Her typical bedtime is reported to be around 11 PM Rebecca usual wake time is around 3:30 to 4 AM. Sleep onset typically occurs within 30-60 minutes. 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 She has been working in [place] since 7/12. She lost her BF in 2012, which was a big set back for her. She reports feeling poorly rested upon awakening. She wakes up on an average 0 to 1 time in the middle of the night Rebecca has to go to the bathroom 0 to 1 time on a typical night. She denies morning headaches.  She reports excessive daytime somnolence (EDS) Rebecca Her Epworth Sleepiness Score (ESS) is 5/24 today. She has not fallen asleep while driving. The patient has not been taking a planned nap, but falls asleep in the [place] to work, but the ride is so bumpy, that she does not take a consolidated nap.   She has been known to snore for the past many years. Snoring is reportedly moderate, Rebecca associated with occasional witnessed apneas. The patient denies a sense of choking or strangling feeling. There is no report of nighttime reflux, with no nighttime cough experienced. The patient  has not noted any RLS symptoms Rebecca is not known to kick while asleep or before falling asleep. There is no family history of RLS, but her sister is on a CPAP machine for OSA.  She is a restless sleeper Rebecca in the morning, the bed is quite disheveled.   She denies cataplexy, sleep paralysis, hypnagogic or hypnopompic hallucinations, or sleep attacks. She does not report any vivid dreams, nightmares, dream enactments, or parasomnias, such as sleep talking or sleep walking. The patient has not had a sleep study or a home sleep test.  She consumes 0 to 1 caffeinated beverage per day, usually in the form of coffee.  Her bedroom is usually dark Rebecca cool. There is a TV in the bedroom Rebecca usually it is on all night long.   Her Past Medical History Is Significant For: Past Medical History  Diagnosis Date  . Rebecca [name]   . [name]   . Thyroid disease     Her Past Surgical History Is Significant For: Past Surgical History  Procedure Laterality Date  . Abdominal hysterectomy      Her Family History Is Significant For: Family History  Problem Relation Age of Onset  . Cancer Mother     pancreatic cancer  . Diabetes Mother   . Heart disease Mother     rheumatic heart disease  . Rebecca [name] Mother   . COPD Sister   . Sleep apnea Sister   . Cancer Sister  Lung Cancer  . Cancer Brother     lung cancer  . Cancer Father     renal, bone  . Diabetes Father   . Rebecca [name] Father   . Heart attack Maternal Grandfather     Her Social History Is Significant For: History   Social History  . Marital Status: Single    Spouse Name: N/A    Number of Children: 0  . Years of Education: 18.5   Occupational History  . Retired [employer]    [employer] x 30 years  . 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 Smoking status: Never Smoker   . Smokeless tobacco: Never Used  . Alcohol Use: 2.5 - 3.5 oz/week    5-7 drink(s)  per week     Comment: 4-5 * week/ 1-2 drinks  . Drug Use: No  . Sexual Activity: Yes    Partners: Male    Birth Control/ Protection: Surgical   Other Topics Concern  . None   Social History Narrative   Close friend/significant other killed (GSW) 2012.   Retired after 30 years, Rebecca took another job (commutes to [place] 4 days/week).    Her Allergies Are:  No Known Allergies:   Her Current Medications Are:  Outpatient Encounter Prescriptions as of 07/27/2013  Medication Sig Dispense Refill  . levothyroxine (SYNTHROID, LEVOTHROID) 25 MCG tablet Take 25 mcg by mouth daily before breakfast.      . Multiple Vitamins-Minerals (MULTIVITAMIN PO) Take 1 tablet by mouth.      . Omega-3 Fatty Acids (FISH OIL) 1200 MG CAPS Take 1,200 mg by mouth every morning.      . [DISCONTINUED] cyclobenzaprine (FLEXERIL) 10 MG tablet Take 1 tablet (10 mg total) by mouth 3 (three) times daily as needed for muscle spasms.  30 tablet  0  . [DISCONTINUED] doxycycline (VIBRA-TABS) 100 MG tablet Take 1 tablet (100 mg total) by mouth 2 (two) times daily.  20 tablet  0  . [DISCONTINUED] levofloxacin (LEVAQUIN) 500 MG tablet Take 1 tablet (500 mg total) by mouth daily.  7 tablet  0   Facility-Administered Encounter Medications as of 07/27/2013  Medication Dose Route Frequency Provider Last Rate Last Dose  . tetanus & diphtheria toxoids (adult) (TENIVAC) injection 0.5 mL  0.5 mL Intramuscular Once Gwenlyn Found Copland, MD      :  Review of Systems:  Out of a complete 14 point review of systems, all are reviewed Rebecca negative with the exception of these symptoms as listed below:  Review of Systems  Constitutional:       Weight gain  Respiratory:       Snoring    Objective:  Neurologic Exam  Physical Exam Physical Examination:   Filed Vitals:   07/27/13 0900  BP: 125/80  Pulse: 76  Temp: 98.5 F (36.9 C)    General Examination: The patient is a very pleasant 57 y.o. female in no acute distress. She  appears well-developed Rebecca well-nourished Rebecca very well groomed. She is obese.   HEENT: Normocephalic, atraumatic, pupils are equal, round Rebecca reactive to light Rebecca accommodation. Funduscopic exam is normal with sharp disc margins noted. Extraocular tracking is good without limitation to gaze excursion or nystagmus noted. Normal smooth pursuit is noted. Hearing is grossly intact. Tympanic membranes are clear bilaterally. Face is symmetric with normal facial animation Rebecca normal facial sensation. Speech is clear with no dysarthria noted. There is  no hypophonia. There is no lip, neck/head, jaw or voice tremor. Neck is supple with full range of passive Rebecca active motion. There are no carotid bruits on auscultation. Oropharynx exam reveals: mild mouth dryness, adequate dental hygiene Rebecca marked airway crowding, due to large tonsils of 3+, R larger than L. Mallampati is class II. Tongue protrudes centrally Rebecca palate elevates symmetrically. Neck size is 16.25 inches.   Chest: Clear to auscultation without wheezing, rhonchi or crackles noted.  Heart: S1+S2+0, regular Rebecca normal without murmurs, rubs or gallops noted.   Abdomen: Soft, non-tender Rebecca non-distended with normal bowel sounds appreciated on auscultation.  Extremities: There is no pitting edema in the distal lower extremities bilaterally. Pedal pulses are intact.  Skin: Warm Rebecca dry without trophic changes noted. There are no varicose veins.  Musculoskeletal: exam reveals no obvious joint deformities, tenderness or joint swelling or erythema.   Neurologically:  Mental status: The patient is awake, alert Rebecca oriented in all 4 spheres. Her memory, attention, language Rebecca knowledge are appropriate. There is no aphasia, agnosia, apraxia or anomia. Speech is clear with normal prosody Rebecca enunciation. Thought process is linear. Mood is congruent Rebecca affect is normal.  Cranial nerves are as described above under HEENT exam. In addition, shoulder shrug  is normal with equal shoulder height noted. Motor exam: Normal bulk, strength Rebecca tone is noted. There is no drift, tremor or rebound. Romberg is negative. Reflexes are 2+ throughout. Toes are downgoing bilaterally. Fine motor skills are intact with normal finger taps, normal hand movements, normal rapid alternating patting, normal foot taps Rebecca normal foot agility.  Cerebellar testing shows no dysmetria or intention tremor on finger to nose testing. Heel to shin is unremarkable bilaterally. There is no truncal or gait ataxia.  Sensory exam is intact to light touch, pinprick, vibration, temperature sense Rebecca proprioception in the upper Rebecca lower extremities.  Gait, station Rebecca balance are unremarkable. No veering to one side is noted. No leaning to one side is noted. Posture is age-appropriate Rebecca stance is narrow based. No problems turning are noted. She turns en bloc. Tandem walk is unremarkable. Intact toe Rebecca heel stance is noted.               Assessment Rebecca Plan:   In summary, Rebecca [name] is a very pleasant 57 y.o.-year old female with a history Rebecca physical exam concerning for obstructive sleep apnea (OSA). I had a long chat with the patient about my findings Rebecca the diagnosis, its prognosis Rebecca treatment options. We talked about medical treatments Rebecca non-pharmacological approaches. I explained in particular the risks Rebecca ramifications of untreated moderate to severe OSA, especially with respect to developing cardiovascular disease down the Road, including congestive heart failure, difficult to treat [name], cardiac arrhythmias, or stroke. Even type 2 diabetes has in part been linked to untreated OSA. We talked about trying to maintain a healthy lifestyle in general, as well as the importance of weight control. I encouraged the patient to eat healthy, exercise daily Rebecca keep well hydrated, to keep a scheduled bedtime Rebecca wake time routine, to not skip any meals Rebecca eat healthy snacks in  between meals.  I recommended the following at this time: sleep study with potential positive airway pressure titration.  I explained the sleep test procedure to the patient Rebecca also outlined possible surgical Rebecca non-surgical treatment options of OSA, including the use of a custom-made dental device, upper airway surgical options, such as pillar implants, radiofrequency surgery, tongue  base surgery, Rebecca UPPP. I also explained the CPAP treatment option to the patient, who indicated that she would be reluctant willing to try CPAP if the need arises. I explained the importance of being compliant with PAP treatment, not only for insurance purposes but primarily to improve Her symptoms, Rebecca for the patient's long term health benefit, including to reduce Her cardiovascular risks. I answered all her questions today Rebecca the patient was in agreement. I would like to see her back after the sleep study is completed Rebecca encouraged her to call with any interim questions, concerns, problems or updates.   Thank you very much for allowing me to participate in the care of this nice patient. If I can be of any further assistance to you please do not hesitate to call me at [phone].  Sincerely,   Huston Foley, MD, PhD

## 2013-07-27 NOTE — Patient Instructions (Signed)
Based on your symptoms and your exam I believe you are at risk for obstructive sleep apnea or OSA, and I think we should proceed with a sleep study to determine whether you do or do not have OSA and how severe it is. If you have more than mild OSA, I want you to consider treatment with CPAP. Please remember, the risks and ramifications of moderate to severe obstructive sleep apnea or OSA are: Cardiovascular disease, including congestive heart failure, stroke, difficult to control hypertension, arrhythmias, and even type 2 diabetes has been linked to untreated OSA. Sleep apnea causes disruption of sleep and sleep deprivation in most cases, which, in turn, can cause recurrent headaches, problems with memory, mood, concentration, focus, and vigilance. Most people with untreated sleep apnea report excessive daytime sleepiness, which can affect their ability to drive. Please do not drive if you feel sleepy.  I will see you back after your sleep study to go over the test results and where to go from there. We will call you after your sleep study and to set up an appointment at the time.   Please remember to try to maintain good sleep hygiene, which means: Keep a regular sleep and wake schedule, try not to exercise or have a meal within 2 hours of your bedtime, try to keep your bedroom conducive for sleep, that is, cool and dark, without light distractors such as an illuminated alarm clock, and refrain from watching TV right before sleep or in the middle of the night and do not keep the TV or radio on during the night. Also, try not to use or play on electronic devices at bedtime, such as your cell phone, tablet PC or laptop. If you like to read at bedtime on an electronic device, try to dim the background light as much as possible.  

## 2013-08-02 ENCOUNTER — Encounter: Payer: Self-pay | Admitting: Gastroenterology

## 2013-08-20 ENCOUNTER — Other Ambulatory Visit: Payer: BC Managed Care – PPO

## 2013-08-23 ENCOUNTER — Ambulatory Visit (INDEPENDENT_AMBULATORY_CARE_PROVIDER_SITE_OTHER): Payer: BC Managed Care – PPO

## 2013-08-23 DIAGNOSIS — G4733 Obstructive sleep apnea (adult) (pediatric): Secondary | ICD-10-CM

## 2013-08-23 DIAGNOSIS — IMO0002 Reserved for concepts with insufficient information to code with codable children: Secondary | ICD-10-CM

## 2013-08-31 ENCOUNTER — Encounter: Payer: Self-pay | Admitting: Gastroenterology

## 2013-09-03 IMAGING — CR DG CHEST 2V
2 series · 2 of 2 positions shown · non-contrast
Comparison: None

CLINICAL DATA: Chest pain.

CHEST - 2 VIEW

[PA]
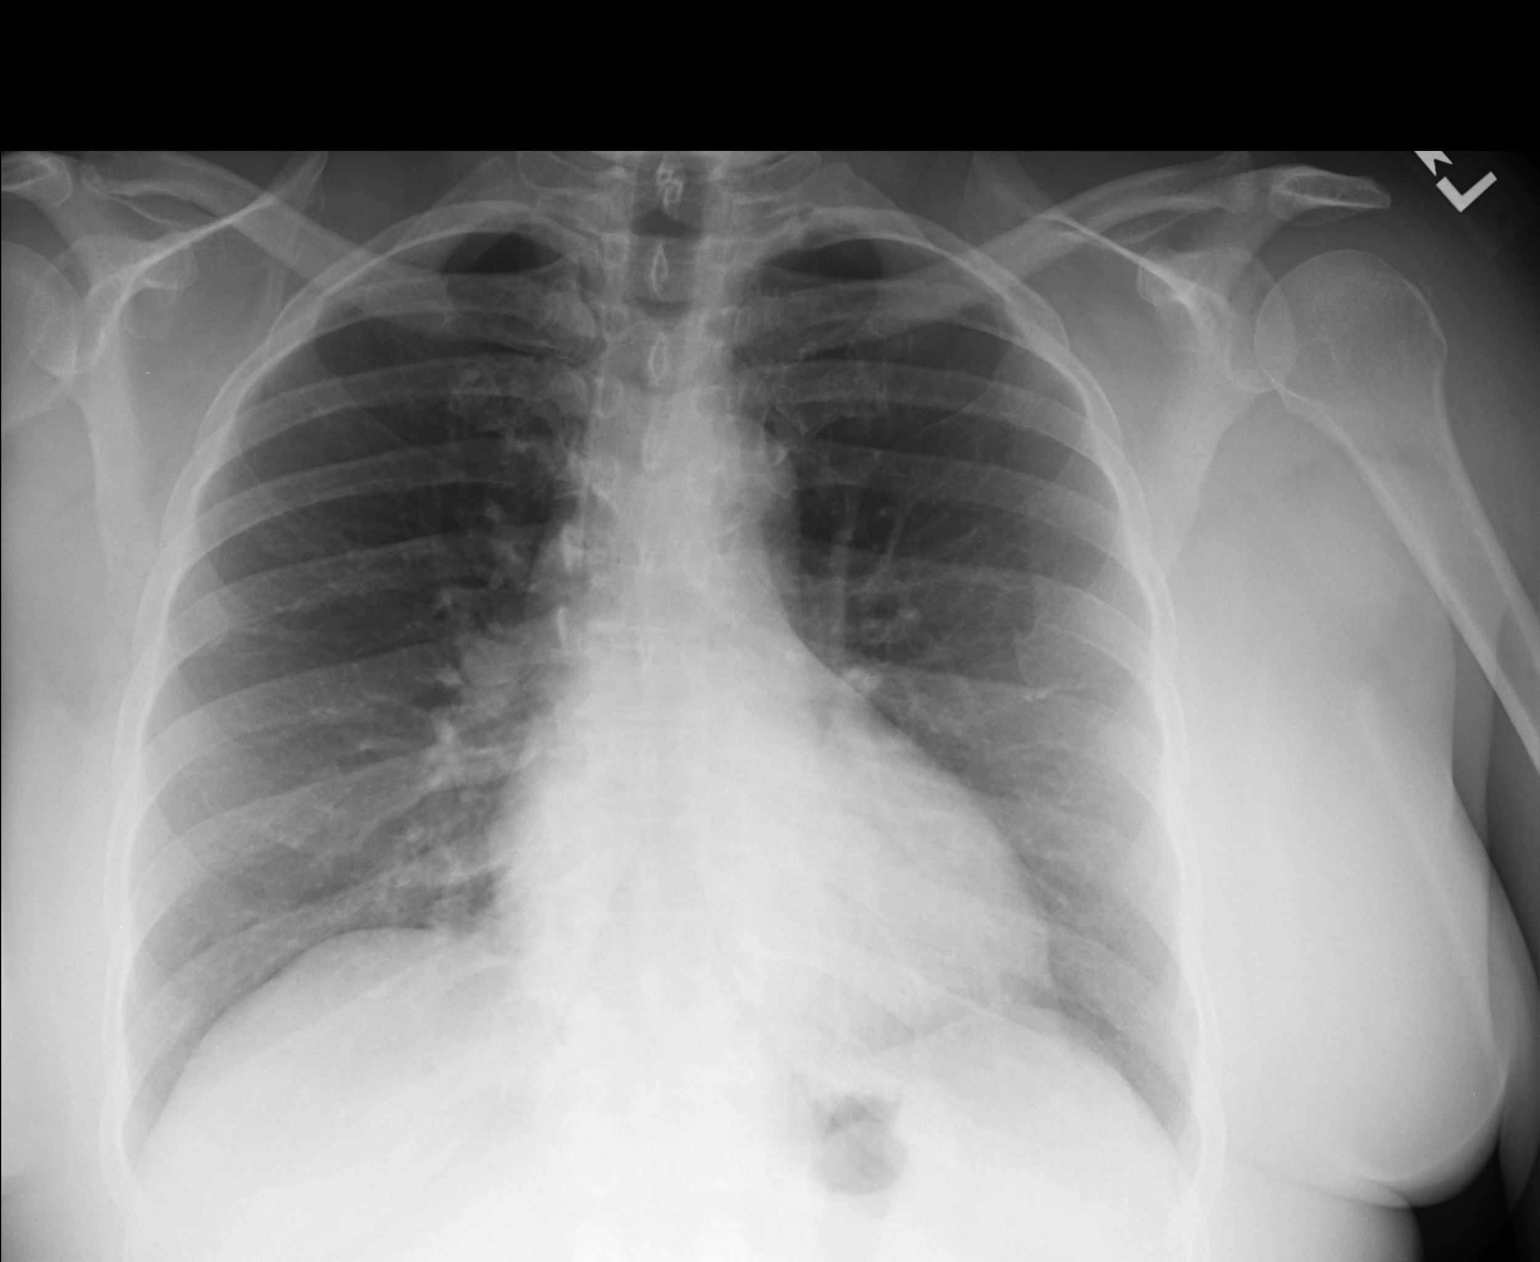

[lateral]
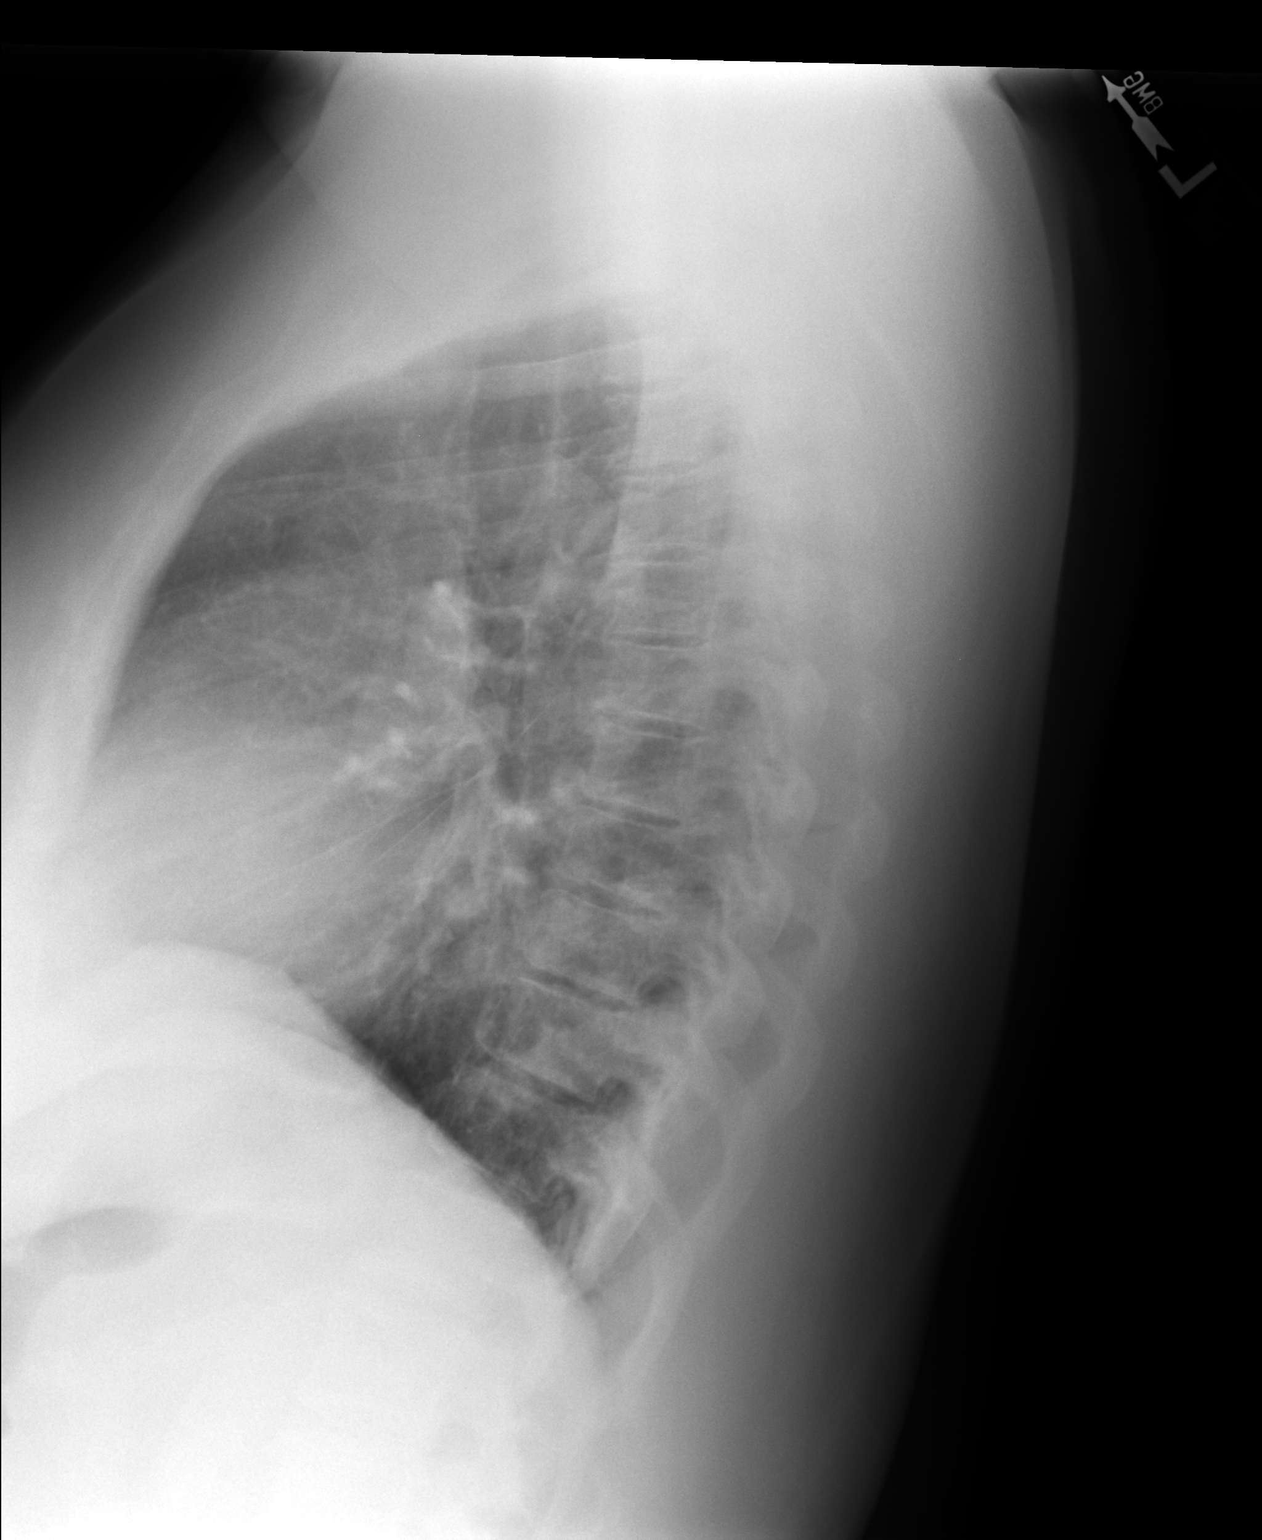

[2 of 2 positions shown; findings below may reference images not displayed]

FINDINGS: Heart and mediastinal contours are within normal limits.
No focal opacities or effusions.  No acute bony abnormality.
IMPRESSION: No active cardiopulmonary disease.

## 2013-09-05 ENCOUNTER — Telehealth: Payer: Self-pay | Admitting: Neurology

## 2013-09-05 DIAGNOSIS — G4733 Obstructive sleep apnea (adult) (pediatric): Secondary | ICD-10-CM

## 2013-09-05 NOTE — Telephone Encounter (Signed)
Please call and notify the patient that the recent sleep study did confirm the diagnosis of obstructive sleep apnea and that I recommend treatment for this in the form of CPAP. While her apneas were in the mild category, she did have a desaturation nadir of 80% and given her sleep related complaints of non-restorative sleep and her medical Hx of HTN, I think she would benefit from treatment with CPAP. This will require a repeat sleep study for proper titration and mask fitting. Please explain to patient and arrange for a CPAP titration study. I have placed an order in the chart.  Thanks, Huston Foley, MD, PhD [facility])

## 2013-09-06 ENCOUNTER — Encounter: Payer: Self-pay | Admitting: *Deleted

## 2013-09-06 ENCOUNTER — Telehealth: Payer: Self-pay | Admitting: Neurology

## 2013-09-06 NOTE — Telephone Encounter (Signed)
Patient was given her sleep study results and she stated she doesn't want to be on CPAP and would like to speak with physician about another alternative.

## 2013-09-06 NOTE — Telephone Encounter (Signed)
Patient called and I spoke with her about her sleep study results. I informed the patient that her recent sleep study test did confirm the diagnosis of obstructive sleep apnea and that Dr. Frances Furbish is recommends the treatment of CPAP. Patient asked about how many times did apnea occur during the night, I answered her question, but the patient  has stated she doesn't want to be on CPAP. Patient would like to speak with physician.

## 2013-09-06 NOTE — Telephone Encounter (Signed)
I called and left a message for the patient to callback to the office to speak with  me (Renee Erb) about her recent sleep study results. 

## 2013-09-06 NOTE — Telephone Encounter (Signed)
Please arrange for FU appt, thx  s

## 2013-09-07 ENCOUNTER — Ambulatory Visit: Payer: BC Managed Care – PPO | Admitting: Gastroenterology

## 2013-09-07 ENCOUNTER — Telehealth: Payer: Self-pay | Admitting: Neurology

## 2013-09-07 NOTE — Telephone Encounter (Signed)
I called and left a message for the patient to callback to the office to speak with me about scheduling a f/u with Dr. Frances Furbish per Dr. Frances Furbish

## 2013-09-17 ENCOUNTER — Encounter: Payer: Self-pay | Admitting: Gastroenterology

## 2013-09-17 ENCOUNTER — Ambulatory Visit: Payer: BC Managed Care – PPO | Admitting: Gastroenterology

## 2013-09-17 ENCOUNTER — Ambulatory Visit (INDEPENDENT_AMBULATORY_CARE_PROVIDER_SITE_OTHER): Payer: BC Managed Care – PPO | Admitting: Gastroenterology

## 2013-09-17 VITALS — BP 110/74 | HR 60 | Ht 62.75 in | Wt 229.0 lb

## 2013-09-17 DIAGNOSIS — Z1331 Encounter for screening for depression: Secondary | ICD-10-CM | POA: Insufficient documentation

## 2013-09-17 DIAGNOSIS — Z1211 Encounter for screening for malignant neoplasm of colon: Secondary | ICD-10-CM | POA: Insufficient documentation

## 2013-09-17 MED ORDER — NA SULFATE-K SULFATE-MG SULF 17.5-3.13-1.6 GM/177ML PO SOLN: 1.0000 | Freq: Once | ORAL | Status: DC

## 2013-09-17 NOTE — Assessment & Plan Note (Signed)
Patient will be scheduled for screening colonoscopy 

## 2013-09-17 NOTE — Progress Notes (Signed)
History of Present Illness: Pleasant 57 year old Afro-American female referred for screening colonoscopy.  Bowels are somewhat irregular alternating between loose and constipated stools.  Since starting a low fat high fiber diet bowels have become more regular.  There is no history of rectal bleeding or abdominal pain.    Past Medical History  Diagnosis Date  . Hyperlipidemia   . Obesity   . Thyroid disease    Past Surgical History  Procedure Laterality Date  . Abdominal hysterectomy     family history includes COPD in her sister; Cancer in her brother, father, mother, and sister; Diabetes in her father and mother; Heart attack in her maternal grandfather; Heart disease in her mother; Hyperlipidemia in her father and mother; Sleep apnea in her sister. Current Outpatient Prescriptions  Medication Sig Dispense Refill  . levothyroxine (SYNTHROID, LEVOTHROID) 25 MCG tablet Take 25 mcg by mouth daily before breakfast.      . Multiple Vitamins-Minerals (MULTIVITAMIN PO) Take 1 tablet by mouth.      . Omega-3 Fatty Acids (FISH OIL) 1200 MG CAPS Take 1,200 mg by mouth every morning.       Current Facility-Administered Medications  Medication Dose Route Frequency Provider Last Rate Last Dose  . tetanus & diphtheria toxoids (adult) (TENIVAC) injection 0.5 mL  0.5 mL Intramuscular Once Pearline Cables, MD       Allergies as of 09/17/2013  . (No Known Allergies)    reports that she has never smoked. She has never used smokeless tobacco. She reports that she drinks about 2.5 ounces of alcohol per week. She reports that she does not use illicit drugs.     Review of Systems: Pertinent positive and negative review of systems were noted in the above HPI section. All other review of systems were otherwise negative.  Vital signs were reviewed in today's medical record Physical Exam: General: Well developed , well nourished, no acute distress Skin: anicteric Head: Normocephalic and  atraumatic Eyes:  sclerae anicteric, EOMI Ears: Normal auditory acuity Mouth: No deformity or lesions Neck: Supple, no masses or thyromegaly Lungs: Clear throughout to auscultation Heart: Regular rate and rhythm; no murmurs, rubs or bruits Abdomen: Soft, non tender and non distended. No masses, hepatosplenomegaly or hernias noted. Normal Bowel sounds Rectal:deferred Musculoskeletal: Symmetrical with no gross deformities  Skin: No lesions on visible extremities Pulses:  Normal pulses noted Extremities: No clubbing, cyanosis, edema or deformities noted Neurological: Alert oriented x 4, grossly nonfocal Cervical Nodes:  No significant cervical adenopathy Inguinal Nodes: No significant inguinal adenopathy Psychological:  Alert and cooperative. Normal mood and affect

## 2013-09-17 NOTE — Patient Instructions (Signed)

## 2013-09-18 ENCOUNTER — Encounter: Payer: Self-pay | Admitting: Gastroenterology

## 2013-10-18 ENCOUNTER — Ambulatory Visit: Payer: Self-pay | Admitting: Physician Assistant

## 2013-11-08 LAB — HM MAMMOGRAPHY

## 2013-11-15 ENCOUNTER — Ambulatory Visit: Payer: Self-pay | Admitting: Physician Assistant

## 2013-11-16 ENCOUNTER — Ambulatory Visit (AMBULATORY_SURGERY_CENTER): Payer: BC Managed Care – PPO | Admitting: Gastroenterology

## 2013-11-16 ENCOUNTER — Encounter: Payer: Self-pay | Admitting: Gastroenterology

## 2013-11-16 VITALS — BP 109/76 | HR 72 | Temp 97.1°F | Resp 16 | Ht 62.75 in | Wt 229.0 lb

## 2013-11-16 DIAGNOSIS — D126 Benign neoplasm of colon, unspecified: Secondary | ICD-10-CM

## 2013-11-16 DIAGNOSIS — Z1211 Encounter for screening for malignant neoplasm of colon: Secondary | ICD-10-CM

## 2013-11-16 MED ORDER — SODIUM CHLORIDE 0.9 % IV SOLN: 500.0000 mL | INTRAVENOUS | Status: DC

## 2013-11-16 NOTE — Op Note (Signed)
[facility]  [address] [place], [id number]   COLONOSCOPY PROCEDURE REPORT  PATIENT: Rebecca, [name]  MR#: [id number] BIRTHDATE: January 30, 1956 , 36  yrs. old GENDER: Female ENDOSCOPIST: Inda Castle, MD REFERRED BY: PROCEDURE DATE:  11/16/2013 PROCEDURE:   Colonoscopy with cold biopsy polypectomy First Screening Colonoscopy - Avg.  risk and is 50 yrs.  old or older Yes.      History of Adenoma - Now for follow-up colonoscopy & has been > or = to 3 yrs.  N/A  Polyps Removed Today? Yes. ASA CLASS:   Class II INDICATIONS:Average risk patient for colon cancer. MEDICATIONS: MAC sedation, administered by CRNA and propofol (Diprivan) 300mg  IV  DESCRIPTION OF PROCEDURE:   After the risks benefits and alternatives of the procedure were thoroughly explained, informed consent was obtained.  A digital rectal exam revealed no abnormalities of the rectum.   The LB [id number]  endoscope was introduced through the anus and advanced to the cecum, which was identified by both the appendix and ileocecal valve. No adverse events experienced.   The quality of the prep was excellent using Suprep  The instrument was then slowly withdrawn as the colon was fully examined.      COLON FINDINGS: A sessile polyp measuring 2 mm in size was found in the rectum.  A polypectomy was performed with cold forceps.   The colon was otherwise normal.  There was no diverticulosis, inflammation, polyps or cancers unless previously stated. Retroflexed views revealed no abnormalities. The time to cecum=2 minutes 04 seconds.  Withdrawal time=8 minutes 13 seconds.  The scope was withdrawn and the procedure completed. COMPLICATIONS: There were no complications.  ENDOSCOPIC IMPRESSION: 1.   Sessile polyp measuring 2 mm in size was found in the rectum; polypectomy was performed with cold forceps 2.   The colon was otherwise normal  RECOMMENDATIONS: If the polyp(s) removed today are  proven to be adenomatous (pre-cancerous) polyps, you will need a repeat colonoscopy in 5 years.  Otherwise you should continue to follow colorectal cancer screening guidelines for "routine risk" patients with colonoscopy in 10 years.  You will receive a letter within 1-2 weeks with the results of your biopsy as well as final recommendations.  Please call my office if you have not received a letter after 3 weeks.   eSigned:  Inda Castle, MD 11/16/2013 2:57 PM   cc: Daphane Shepherd, PA   PATIENT NAME:  Rebecca, [name] MR#: [id number]

## 2013-11-16 NOTE — Progress Notes (Signed)
Pt stable to RR 

## 2013-11-16 NOTE — Progress Notes (Signed)
Called to room to assist during endoscopic procedure.  Patient ID and intended procedure confirmed with present staff. Received instructions for my participation in the procedure from the performing physician.  

## 2013-11-16 NOTE — Patient Instructions (Signed)
YOU HAD AN ENDOSCOPIC PROCEDURE TODAY AT THE [facility]: Refer to the procedure report that was given to you for any specific questions about what was found during the examination.  If the procedure report does not answer your questions, please call your gastroenterologist to clarify.  If you requested that your care partner not be given the details of your procedure findings, then the procedure report has been included in a sealed envelope for you to review at your convenience later.  YOU SHOULD EXPECT: Some feelings of bloating in the abdomen. Passage of more gas than usual.  Walking can help get rid of the air that was put into your GI tract during the procedure and reduce the bloating. If you had a lower endoscopy (such as a colonoscopy or flexible sigmoidoscopy) you may notice spotting of blood in your stool or on the toilet paper. If you underwent a bowel prep for your procedure, then you may not have a normal bowel movement for a few days.  DIET: Your first meal following the procedure should be a light meal and then it is ok to progress to your normal diet.  A half-sandwich or bowl of soup is an example of a good first meal.  Heavy or fried foods are harder to digest and may make you feel nauseous or bloated.  Likewise meals heavy in dairy and vegetables can cause extra gas to form and this can also increase the bloating.  Drink plenty of fluids but you should avoid alcoholic beverages for 24 hours.  ACTIVITY: Your care partner should take you home directly after the procedure.  You should plan to take it easy, moving slowly for the rest of the day.  You can resume normal activity the day after the procedure however you should NOT DRIVE or use heavy machinery for 24 hours (because of the sedation medicines used during the test).    SYMPTOMS TO REPORT IMMEDIATELY: A gastroenterologist can be reached at any hour.  During normal business hours, 8:30 AM to 5:00 PM Monday through Friday,  call [phone].  After hours and on weekends, please call the GI answering service at [phone] who will take a message and have the physician on call contact you.   Following lower endoscopy (colonoscopy or flexible sigmoidoscopy):  Excessive amounts of blood in the stool  Significant tenderness or worsening of abdominal pains  Swelling of the abdomen that is new, acute  Fever of 100F or higher  FOLLOW UP: If any biopsies were taken you will be contacted by phone or by letter within the next 1-3 weeks.  Call your gastroenterologist if you have not heard about the biopsies in 3 weeks.  Our staff will call the home number listed on your records the next business day following your procedure to check on you and address any questions or concerns that you may have at that time regarding the information given to you following your procedure. This is a courtesy call and so if there is no answer at the home number and we have not heard from you through the emergency physician on call, we will assume that you have returned to your regular daily activities without incident.  SIGNATURES/CONFIDENTIALITY: You and/or your care partner have signed paperwork which will be entered into your electronic medical record.  These signatures attest to the fact that that the information above on your After Visit Summary has been reviewed and is understood.  Full responsibility of the confidentiality of this   discharge information lies with you and/or your care-partner.  Polyps-handout given  Repeat colonoscopy will be determined by pathology   

## 2013-11-19 ENCOUNTER — Telehealth: Payer: Self-pay | Admitting: *Deleted

## 2013-11-19 NOTE — Telephone Encounter (Signed)
  Follow up Call-  Call back number 11/16/2013  Post procedure Call Back phone  # [phone] or [phone]  Permission to leave phone message No     Patient questions:  Do you have a fever, pain , or abdominal swelling? no Pain Score  0 *  Have you tolerated food without any problems? Yes  Have you been able to return to your normal activities? yes  Do you have any questions about your discharge instructions: Diet   no Medications  no Follow up visit  no  Do you have questions or concerns about your Care? no  Actions: * If pain score is 4 or above: No action needed, pain <4.

## 2013-11-23 ENCOUNTER — Encounter: Payer: Self-pay | Admitting: Gastroenterology

## 2013-12-13 ENCOUNTER — Ambulatory Visit (INDEPENDENT_AMBULATORY_CARE_PROVIDER_SITE_OTHER): Payer: BC Managed Care – PPO | Admitting: Physician Assistant

## 2013-12-13 VITALS — BP 152/88 | HR 105 | Temp 99.1°F | Resp 18 | Ht 62.5 in | Wt 237.2 lb

## 2013-12-13 DIAGNOSIS — J029 Acute pharyngitis, unspecified: Secondary | ICD-10-CM

## 2013-12-13 DIAGNOSIS — D72829 Elevated white blood cell count, unspecified: Secondary | ICD-10-CM

## 2013-12-13 DIAGNOSIS — J039 Acute tonsillitis, unspecified: Secondary | ICD-10-CM

## 2013-12-13 LAB — POCT CBC
GRANULOCYTE PERCENT: 75.3 % (ref 37–80)
HEMATOCRIT: 41.3 % (ref 37.7–47.9)
Hemoglobin: 13 g/dL (ref 12.2–16.2)
Lymph, poc: 3.4 (ref 0.6–3.4)
MCH: 29.5 pg (ref 27–31.2)
MCHC: 31.5 g/dL — AB (ref 31.8–35.4)
MCV: 93.8 fL (ref 80–97)
MID (cbc): 0.9 (ref 0–0.9)
MPV: 11.6 fL (ref 0–99.8)
PLATELET COUNT, POC: 240 10*3/uL (ref 142–424)
POC Granulocyte: 13.1 — AB (ref 2–6.9)
POC LYMPH %: 19.7 % (ref 10–50)
POC MID %: 5 %M (ref 0–12)
RBC: 4.4 M/uL (ref 4.04–5.48)
RDW, POC: 14.2 %
WBC: 17.4 10*3/uL — AB (ref 4.6–10.2)

## 2013-12-13 LAB — POCT RAPID STREP A (OFFICE): RAPID STREP A SCREEN: NEGATIVE

## 2013-12-13 MED ORDER — MAGIC MOUTHWASH W/LIDOCAINE: 10.0000 mL | ORAL | Status: DC | PRN

## 2013-12-13 MED ORDER — AMOXICILLIN-POT CLAVULANATE 875-125 MG PO TABS: 1.0000 | ORAL_TABLET | Freq: Two times a day (BID) | ORAL | Status: DC

## 2013-12-13 MED ORDER — CEFTRIAXONE SODIUM 1 G IJ SOLR
1.0000 g | Freq: Once | INTRAMUSCULAR | Status: AC
  Administered 2013-12-13: 1 g via INTRAMUSCULAR

## 2013-12-13 NOTE — Progress Notes (Signed)
   Subjective:    Patient ID: Rebecca [name], female    DOB: [birth year], 58 y.o.   MRN: [id number]  HPI  Patient presents with sore throat beginning 12/12/13 Began as scratchy feeling and has worsened over past 2 days.  Denies nasal congestion, post nasal drip, cough and ear pain.  Denies fever and body aches. Notes chills.   Review of Systems  Respiratory: Negative for wheezing.   Cardiovascular: Negative for chest pain and palpitations.  Gastrointestinal: Negative for nausea and constipation.  Genitourinary: Negative for dysuria and hematuria.  Neurological: Negative for dizziness and light-headedness.       Objective:   Physical Exam  Vitals reviewed. Constitutional: She is oriented to person, place, and time. She appears well-developed and well-nourished.  HENT:  Head: Normocephalic and atraumatic.  Right Ear: External ear normal.  Left Ear: External ear normal.  Nose: Nose normal.  Mouth/Throat: Uvula is midline and mucous membranes are normal. Oropharyngeal exudate, posterior oropharyngeal edema and posterior oropharyngeal erythema present. No tonsillar abscesses.  Eyes: Conjunctivae are normal.  Cardiovascular: Normal rate, regular rhythm and normal heart sounds.   Lymphadenopathy:       Head (right side): No submental, no submandibular, no tonsillar, no preauricular, no posterior auricular and no occipital adenopathy present.       Head (left side): No submental, no submandibular, no tonsillar, no preauricular, no posterior auricular and no occipital adenopathy present.    She has no cervical adenopathy.       Right: No supraclavicular adenopathy present.       Left: No supraclavicular adenopathy present.  Neurological: She is alert and oriented to person, place, and time.  Skin: Skin is warm and dry.  Psychiatric: She has a normal mood and affect. Her behavior is normal. Judgment and thought content normal.       Assessment & Plan:   1. Acute pharyngitis -  POCT rapid strep A - POCT CBC - Culture, Group A Strep  2. Tonsillitis Take augmentin for tonsillitis - may take with food. May use Magic Mouthwash every 2 hours. If unable to drink or not keep down fluids, return here tomorrow. Will call with results of TC.  - amoxicillin-clavulanate (AUGMENTIN) 875-125 MG per tablet; Take 1 tablet by mouth 2 (two) times daily.  Dispense: 20 tablet; Refill: 0 - Alum & Mag Hydroxide-Simeth (MAGIC MOUTHWASH W/LIDOCAINE) SOLN; Take 10 mLs by mouth every 2 (two) hours as needed for mouth pain.  Dispense: 360 mL; Refill: 0  3. Leukocytosis, unspecified Rocephin administered with no complications. Return here on 12/15/13 to recheck CBC.  - cefTRIAXone (ROCEPHIN) injection 1 g; Inject 1 g into the muscle once.

## 2013-12-14 NOTE — Progress Notes (Signed)
I have examined this patient along with the student and agree.  

## 2013-12-15 ENCOUNTER — Telehealth: Payer: Self-pay

## 2013-12-15 NOTE — Telephone Encounter (Signed)
Pt would like to know her labs. Best# [phone]

## 2013-12-16 LAB — CULTURE, GROUP A STREP: Organism ID, Bacteria: NORMAL

## 2013-12-16 NOTE — Telephone Encounter (Signed)
Advised pt we would call her as soon as her results are reviewed by her provider.

## 2013-12-17 ENCOUNTER — Ambulatory Visit: Payer: BC Managed Care – PPO

## 2013-12-17 ENCOUNTER — Ambulatory Visit (INDEPENDENT_AMBULATORY_CARE_PROVIDER_SITE_OTHER): Payer: BC Managed Care – PPO | Admitting: Physician Assistant

## 2013-12-17 VITALS — BP 124/76 | HR 81 | Temp 98.4°F | Resp 20 | Ht 62.0 in | Wt 233.6 lb

## 2013-12-17 DIAGNOSIS — R059 Cough, unspecified: Secondary | ICD-10-CM

## 2013-12-17 DIAGNOSIS — R05 Cough: Secondary | ICD-10-CM

## 2013-12-17 DIAGNOSIS — J029 Acute pharyngitis, unspecified: Secondary | ICD-10-CM

## 2013-12-17 DIAGNOSIS — D72829 Elevated white blood cell count, unspecified: Secondary | ICD-10-CM

## 2013-12-17 LAB — POCT CBC
GRANULOCYTE PERCENT: 63.9 % (ref 37–80)
HCT, POC: 43.1 % (ref 37.7–47.9)
Hemoglobin: 13 g/dL (ref 12.2–16.2)
LYMPH, POC: 3 (ref 0.6–3.4)
MCH, POC: 28.7 pg (ref 27–31.2)
MCHC: 30.2 g/dL — AB (ref 31.8–35.4)
MCV: 95.1 fL (ref 80–97)
MID (CBC): 0.9 (ref 0–0.9)
MPV: 11.3 fL (ref 0–99.8)
PLATELET COUNT, POC: 292 10*3/uL (ref 142–424)
POC GRANULOCYTE: 6.8 (ref 2–6.9)
POC LYMPH PERCENT: 28 %L (ref 10–50)
POC MID %: 8.1 %M (ref 0–12)
RBC: 4.53 M/uL (ref 4.04–5.48)
RDW, POC: 15.1 %
WBC: 10.6 10*3/uL — AB (ref 4.6–10.2)

## 2013-12-17 MED ORDER — GUAIFENESIN ER 1200 MG PO TB12: 1.0000 | ORAL_TABLET | Freq: Two times a day (BID) | ORAL | Status: DC | PRN

## 2013-12-17 MED ORDER — HYDROCOD POLST-CHLORPHEN POLST 10-8 MG/5ML PO LQCR: 5.0000 mL | Freq: Two times a day (BID) | ORAL | Status: DC | PRN

## 2013-12-17 MED ORDER — IPRATROPIUM BROMIDE 0.03 % NA SOLN: 2.0000 | Freq: Two times a day (BID) | NASAL | Status: DC

## 2013-12-17 NOTE — Progress Notes (Signed)
Subjective:    Patient ID: Rebecca [name], female    DOB: 04-Nov-1956, 58 y.o.   MRN: [id number]  HPI  Patient presents with chest congestion and productive cough with white sputum beginning 12/15/13.  Reports nasal congestion, headache, fever, chills and body aches. Denies post nasal drip and sinus pressure.  Patient is still taking augmentin that was prescribed for tonsilitis on 12/13/13. She was also given rocephin injection on 12/13/13. Her rapid strep and throat culture were negative and her WBC was 17.4 on 12/13/13.  Throat pain has resolved. She has also been taking Mucinex and theraflu.  Reports nausea and diarrhea after taking augmentin.   Review of Systems As above.     Objective:   Physical Exam  Constitutional: She is oriented to person, place, and time. Vital signs are normal. She appears well-developed and well-nourished.  HENT:  Head: Normocephalic and atraumatic.  Right Ear: Tympanic membrane, external ear and ear canal normal.  Left Ear: Tympanic membrane, external ear and ear canal normal.  Nose: Nose normal.  Mouth/Throat: Uvula is midline and mucous membranes are normal. Oropharyngeal exudate (mild on right tonsil), posterior oropharyngeal edema and posterior oropharyngeal erythema present.  Eyes: Conjunctivae are normal.  Neck: Neck supple.  Cardiovascular: Normal rate, regular rhythm and normal heart sounds.   Pulmonary/Chest: Effort normal. She has wheezes (right lower lung posterioly, a few).  Lymphadenopathy:       Head (right side): No submental, no submandibular, no tonsillar, no preauricular and no posterior auricular adenopathy present.       Head (left side): No submental, no submandibular, no tonsillar and no posterior auricular adenopathy present.    She has no cervical adenopathy.       Right: No supraclavicular adenopathy present.       Left: No supraclavicular adenopathy present.  Neurological: She is alert and oriented to person, place, and time.    Skin: Skin is warm and dry.  Psychiatric: She has a normal mood and affect. Her behavior is normal. Judgment and thought content normal.   Results for orders placed in visit on 12/17/13  POCT CBC      Result Value Range   WBC 10.6 (*) 4.6 - 10.2 K/uL   Lymph, poc 3.0  0.6 - 3.4   POC LYMPH PERCENT 28.0  10 - 50 %L   MID (cbc) 0.9  0 - 0.9   POC MID % 8.1  0 - 12 %M   POC Granulocyte 6.8  2 - 6.9   Granulocyte percent 63.9  37 - 80 %G   RBC 4.53  4.04 - 5.48 M/uL   Hemoglobin 13.0  12.2 - 16.2 g/dL   HCT, POC 43.1  37.7 - 47.9 %   MCV 95.1  80 - 97 fL   MCH, POC 28.7  27 - 31.2 pg   MCHC 30.2 (*) 31.8 - 35.4 g/dL   RDW, POC 15.1     Platelet Count, POC 292  142 - 424 K/uL   MPV 11.3  0 - 99.8 fL   CXR reviewed with Dr. Marin Comment - negative     Assessment & Plan:   1. Acute pharyngitis Most likely secondary to tonsillitis. Tonsillitis has significantly improved. Will treat symptomatically with Mucinex, Atrovent and Tussionex. Tussionex may cause drowsiness so do not drive while taking medication. Patient advised to complete Augmentin, rest and drink plenty of fluids. If no improvement in 48-72 hr, return for re-evaluation.   2. Leukocytosis, unspecified Resolved.  -  POCT CBC  3. Cough Most likely post viral secondary to tonsillitis which has significantly improved. See above for treatment.  - DG Chest 2 View; Future - ipratropium (ATROVENT) 0.03 % nasal spray; Place 2 sprays into both nostrils 2 (two) times daily.  Dispense: 30 mL; Refill: 0 - Guaifenesin (MUCINEX MAXIMUM STRENGTH) 1200 MG TB12; Take 1 tablet (1,200 mg total) by mouth every 12 (twelve) hours as needed.  Dispense: 14 tablet; Refill: 1 - chlorpheniramine-HYDROcodone (TUSSIONEX PENNKINETIC ER) 10-8 MG/5ML LQCR; Take 5 mLs by mouth every 12 (twelve) hours as needed for cough (cough).  Dispense: 100 mL; Refill: 0

## 2013-12-17 NOTE — Progress Notes (Signed)
I have examined this patient along with the student and agree.  

## 2013-12-17 NOTE — Patient Instructions (Signed)
Get plenty of rest and drink at least 64 ounces of water daily. Complete the antibiotic. If you are not improving in the next 48-72 hours, or if your symptoms worsen, please return for re-evaluation.

## 2013-12-20 NOTE — Telephone Encounter (Signed)
I asked her how the cough medication was working. She said she has not been on a cough syrup. I asked her did she not get a hardcopy of her cough syrup, she said she received papers; but that she had not looked at those. After her grabbing those papers she found the prescription and is going to take it to the pharmacy to be filled.

## 2013-12-20 NOTE — Telephone Encounter (Signed)
Patient still has a cough and would like to talk to [facility] regarding additional treatment and cough medication.  [phone]

## 2013-12-20 NOTE — Telephone Encounter (Signed)
I gave her the strongest cough medication I can. Is the Tussionex not working? What other symptoms does she have presently? Any fever?

## 2013-12-20 NOTE — Telephone Encounter (Signed)
Excellent, thank you!

## 2014-01-01 ENCOUNTER — Ambulatory Visit (INDEPENDENT_AMBULATORY_CARE_PROVIDER_SITE_OTHER): Payer: BC Managed Care – PPO | Admitting: Physician Assistant

## 2014-01-01 VITALS — BP 132/80 | HR 92 | Temp 98.4°F | Resp 16 | Ht 62.0 in | Wt 232.0 lb

## 2014-01-01 DIAGNOSIS — R05 Cough: Secondary | ICD-10-CM

## 2014-01-01 DIAGNOSIS — J029 Acute pharyngitis, unspecified: Secondary | ICD-10-CM

## 2014-01-01 DIAGNOSIS — R059 Cough, unspecified: Secondary | ICD-10-CM

## 2014-01-01 LAB — POCT CBC
Granulocyte percent: 67.9 %G (ref 37–80)
HEMATOCRIT: 41.9 % (ref 37.7–47.9)
HEMOGLOBIN: 13 g/dL (ref 12.2–16.2)
LYMPH, POC: 3 (ref 0.6–3.4)
MCH, POC: 29 pg (ref 27–31.2)
MCHC: 31 g/dL — AB (ref 31.8–35.4)
MCV: 93.4 fL (ref 80–97)
MID (cbc): 0.8 (ref 0–0.9)
MPV: 11.1 fL (ref 0–99.8)
POC GRANULOCYTE: 7.9 — AB (ref 2–6.9)
POC LYMPH PERCENT: 25.3 %L (ref 10–50)
POC MID %: 6.8 % (ref 0–12)
Platelet Count, POC: 250 10*3/uL (ref 142–424)
RBC: 4.49 M/uL (ref 4.04–5.48)
RDW, POC: 15 %
WBC: 11.7 10*3/uL — AB (ref 4.6–10.2)

## 2014-01-01 MED ORDER — AZITHROMYCIN 500 MG PO TABS: 500.0000 mg | ORAL_TABLET | Freq: Every day | ORAL | Status: DC

## 2014-01-01 MED ORDER — HYDROCOD POLST-CHLORPHEN POLST 10-8 MG/5ML PO LQCR: 5.0000 mL | Freq: Two times a day (BID) | ORAL | Status: DC | PRN

## 2014-01-01 NOTE — Progress Notes (Signed)
   Subjective:    Patient ID: Rebecca [name], female    DOB: [birth year], 58 y.o.   MRN: [id number]   PCP: Shaunna Rosetti, PA-C  Chief Complaint  Patient presents with  . Sore Throat    ( treated a few weeks ago; symptoms never went away )  . Cough  . Chills  . Fatigue    Medications, allergies, past medical history, surgical history, family history, social history and problem list reviewed and updated.   HPI Presents for evaluation of persistent illness.  Initially seen on 12/13/2013.  Seen again 12/17/2013. The sore throat resolved, but the congestion and cough continued.  Over the past several days, the sore throat has returned, and last night became severe again.  Subjective fever/chills have continued throughout.  She denies feelings of nasal/sinus congestion/drainage, but feels chest congestion.  Review of Systems No CP, SOB, HA, dizziness.  No GI/GU symptoms.    Objective:   Physical Exam  Blood pressure 132/80, pulse 92, temperature 98.4 F (36.9 C), temperature source Oral, resp. rate 16, height 5\' 2"  (1.575 m), weight 232 lb (105.235 kg), SpO2 98.00%. Body mass index is 42.42 kg/(m^2). Well-developed, well nourished BF who is awake, alert and oriented, in NAD. HEENT: [place]/AT, PERRL, EOMI.  Sclera and conjunctiva are clear.  EAC are patent, TMs are normal in appearance. Nasal mucosa is pink and moist. OP is clear, though mildly erythematous. Neck: supple, non-tender, no lymphadenopathy, thyromegaly. Heart: RRR, no murmur Lungs: normal effort, CTA Extremities: no cyanosis, clubbing or edema. Skin: warm and dry without rash. Psychologic: good mood and appropriate affect, normal speech and behavior.   Results for orders placed in visit on 01/01/14  POCT CBC      Result Value Ref Range   WBC 11.7 (*) 4.6 - 10.2 K/uL   Lymph, poc 3.0  0.6 - 3.4   POC LYMPH PERCENT 25.3  10 - 50 %L   MID (cbc) 0.8  0 - 0.9   POC MID % 6.8  0 - 12 %M   POC Granulocyte 7.9 (*) 2 -  6.9   Granulocyte percent 67.9  37 - 80 %G   RBC 4.49  4.04 - 5.48 M/uL   Hemoglobin 13.0  12.2 - 16.2 g/dL   HCT, POC 41.9  37.7 - 47.9 %   MCV 93.4  80 - 97 fL   MCH, POC 29.0  27 - 31.2 pg   MCHC 31.0 (*) 31.8 - 35.4 g/dL   RDW, POC 15.0     Platelet Count, POC 250  142 - 424 K/uL   MPV 11.1  0 - 99.8 fL   Unable to obtain a CXR at our facility today.    Assessment & Plan:  1. Cough Possible post-illness cough, though I'm concerned for atypicals. - POCT CBC - azithromycin (ZITHROMAX) 500 MG tablet; Take 1 tablet (500 mg total) by mouth daily.  Dispense: 3 tablet; Refill: 0 - chlorpheniramine-HYDROcodone (TUSSIONEX PENNKINETIC ER) 10-8 MG/5ML LQCR; Take 5 mLs by mouth every 12 (twelve) hours as needed for cough (cough).  Dispense: 100 mL; Refill: 0  2. Acute pharyngitis Likely secondary to the coughing.  Return in about 1 week (around 01/08/2014) for re-evaluation. If symptoms persist, plan CXR.  Fara Chute, PA-C Physician Assistant-Certified [facility]

## 2014-01-01 NOTE — Patient Instructions (Signed)
You may swallow the mouthwash to help with your sore throat.

## 2014-01-07 ENCOUNTER — Other Ambulatory Visit: Payer: BC Managed Care – PPO

## 2014-01-08 ENCOUNTER — Ambulatory Visit
Admission: RE | Admit: 2014-01-08 | Discharge: 2014-01-08 | Disposition: A | Discharge status: Home / Self Care | Payer: BC Managed Care – PPO | Source: Ambulatory Visit | Attending: Endocrinology | Admitting: Endocrinology

## 2014-01-08 DIAGNOSIS — E049 Nontoxic goiter, unspecified: Secondary | ICD-10-CM

## 2014-04-24 ENCOUNTER — Ambulatory Visit (INDEPENDENT_AMBULATORY_CARE_PROVIDER_SITE_OTHER): Payer: BC Managed Care – PPO | Admitting: Physician Assistant

## 2014-04-24 VITALS — BP 132/84 | HR 80 | Temp 98.1°F | Resp 16 | Ht 63.0 in | Wt 237.4 lb

## 2014-04-24 DIAGNOSIS — M545 Low back pain, unspecified: Secondary | ICD-10-CM

## 2014-04-24 DIAGNOSIS — M79605 Pain in left leg: Secondary | ICD-10-CM

## 2014-04-24 MED ORDER — CYCLOBENZAPRINE HCL 10 MG PO TABS: 5.0000 mg | ORAL_TABLET | Freq: Three times a day (TID) | ORAL | Status: DC | PRN

## 2014-04-24 MED ORDER — HYDROCODONE-ACETAMINOPHEN 5-325 MG PO TABS: 1.0000 | ORAL_TABLET | Freq: Four times a day (QID) | ORAL | Status: DC | PRN

## 2014-04-24 MED ORDER — MELOXICAM 15 MG PO TABS: 15.0000 mg | ORAL_TABLET | Freq: Every day | ORAL | Status: DC

## 2014-04-24 NOTE — Patient Instructions (Signed)
Stop the ibuprofen and OTC NSAID that you are taking. Both the cyclobenzaprine (Flexeril, a muscle relaxer) and hydrocodone-acetaminophen (Norco, a pain reliever) can cause sleepiness.  You should not take them and drive-you may need to reserve them for bedtime, or days that you are off work and can stay home. Continue the warm compresses.

## 2014-04-24 NOTE — Progress Notes (Signed)
Subjective:    Patient ID: Rebecca [name], female    DOB: 16-Oct-1956, 58 y.o.   MRN: [id number]   PCP: Nereida Schepp, PA-C  Chief Complaint  Patient presents with  . Back Pain    Lower - radiating down left lower leg x 1 wk no falls/injuries  . Neck Pain    x 1 wk no falls/injuries    Medications, allergies, past medical history, surgical history, family history, social history and problem list reviewed and updated.  Patient Active Problem List   Diagnosis Date Noted  . Special screening for malignant neoplasms, colon 09/17/2013  . Obesity, unspecified 01/16/2013  . Spinal stenosis in cervical region 01/11/2013  . Multiple thyroid nodules 01/11/2013  . Hand pain 06/16/2012  . Hyperlipidemia 06/16/2012    Prior to Admission medications   Medication Sig Start Date End Date Taking? Authorizing Provider  Multiple Vitamins-Minerals (MULTIVITAMIN PO) Take 1 tablet by mouth.   Yes Historical Provider, MD  Omega-3 Fatty Acids (FISH OIL) 1200 MG CAPS Take 1,200 mg by mouth every morning.   Yes Historical Provider, MD  levothyroxine (SYNTHROID, LEVOTHROID) 25 MCG tablet Take 25 mcg by mouth daily before breakfast.   NO Historical Provider, MD  Took the levothyroxine x 6 months, and then followed up with labs.  When she called for the results, she asked about it, but wasn't given a new prescription.  HPI  About 10 days ago had pain on the left side of her back during a car trip to the [place].  The next day, pain was worse, and she couldn't go to work.  Rested for 2 days at home, applying a heating pad to the area, and then went back to work 7 days ago.  Did ok until 2 days ago when it started to worsen again.  It is bearable, but it's still aggravating.  Taking Ibuprofen, which is helping some, and another OTC product, which seems to help her sleep, but makes her too sleepy to take it and go to work.  No loss of bladder or bowel control.  No saddle anesthesia. No LE weakness.  She  DOES have pain that radiates down the LEFT leg to the distal thigh, not as far as the knee.  Review of Systems As above.    Objective:   Physical Exam  Vitals reviewed. Constitutional: She is oriented to person, place, and time. Vital signs are normal. She appears well-developed and well-nourished. She is active and cooperative. No distress.  BP 132/84  Pulse 80  Temp(Src) 98.1 F (36.7 C) (Oral)  Resp 16  Ht 5\' 3"  (1.6 m)  Wt 237 lb 6.4 oz (107.684 kg)  BMI 42.06 kg/m2  SpO2 99%  HENT:  Head: Normocephalic and atraumatic.  Right Ear: Hearing normal.  Left Ear: Hearing normal.  Eyes: Conjunctivae are normal. No scleral icterus.  Neck: Normal range of motion. Neck supple. No thyromegaly present.  Cardiovascular: Normal rate, regular rhythm and normal heart sounds.   Pulses:      Radial pulses are 2+ on the right side, and 2+ on the left side.  Pulmonary/Chest: Effort normal and breath sounds normal.  Lymphadenopathy:       Head (right side): No tonsillar, no preauricular, no posterior auricular and no occipital adenopathy present.       Head (left side): No tonsillar, no preauricular, no posterior auricular and no occipital adenopathy present.    She has no cervical adenopathy.       Right: No  supraclavicular adenopathy present.       Left: No supraclavicular adenopathy present.  Neurological: She is alert and oriented to person, place, and time. No sensory deficit.  Skin: Skin is warm, dry and intact. No rash noted. No cyanosis or erythema. Nails show no clubbing.  Psychiatric: She has a normal mood and affect.          Assessment & Plan:  1. LBP radiating to left leg She has known cervical spinal stenosis, and likely has degenerative changes in the lower back as well.  She'll stop the OTC meds and use meloxicam, cyclobenzaprine and Norco. If symptoms persist, plan referral to PT. - meloxicam (MOBIC) 15 MG tablet; Take 1 tablet (15 mg total) by mouth daily.  Dispense: 30  tablet; Refill: 1 - HYDROcodone-acetaminophen (NORCO) 5-325 MG per tablet; Take 1 tablet by mouth every 6 (six) hours as needed for severe pain.  Dispense: 30 tablet; Refill: 0 - cyclobenzaprine (FLEXERIL) 10 MG tablet; Take 0.5-1 tablets (5-10 mg total) by mouth 3 (three) times daily as needed for muscle spasms.  Dispense: 30 tablet; Refill: 0   Fara Chute, PA-C Physician Assistant-Certified [facility]

## 2014-04-30 ENCOUNTER — Ambulatory Visit (INDEPENDENT_AMBULATORY_CARE_PROVIDER_SITE_OTHER): Payer: BC Managed Care – PPO | Admitting: Family Medicine

## 2014-04-30 VITALS — BP 140/78 | HR 73 | Temp 98.3°F | Resp 16 | Ht 63.0 in | Wt 239.0 lb

## 2014-04-30 DIAGNOSIS — B029 Zoster without complications: Secondary | ICD-10-CM

## 2014-04-30 MED ORDER — VALACYCLOVIR HCL 1 G PO TABS: 1000.0000 mg | ORAL_TABLET | Freq: Three times a day (TID) | ORAL | Status: DC

## 2014-04-30 NOTE — Progress Notes (Signed)
° °  Subjective:    Patient ID: Rebecca [name], female    DOB: 06/09/1956, 58 y.o.   MRN: [id number] This chart was scribed for Robyn Haber, MD by Randa Evens, ED Scribe. This Patient was seen in room 01 and the patients care was started at 6:09 PM  Rash   HPI Comments: Rebecca [name] is a 58 y.o. female who [facility] to the [facility] complaining of a rash on one side of her body. She states she thought the rash was an allergic reaction to the medicine she was prescribed last week while in urgent care. She states the rash has associated itching and burning. She states the rash looks similar to shingles. 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     Review of Systems  Skin: Positive for rash.   patient notes that she's been under quite a bit of stress recently   Objective:    Physical Exam Is an obese woman who is in no acute distress Patient has a typical vesicular rash in patches it involves the left side of her upper chest and neck as well as a patch over her posterior shoulder and anterior axilla.   Assessment & Plan:   Shingles - Plan: valACYclovir (VALTREX) 1000 MG tablet  Signed, Robyn Haber, MD

## 2014-04-30 NOTE — Patient Instructions (Signed)
Shingles °Shingles (herpes zoster) is an infection that is caused by the same virus that causes chickenpox (varicella). The infection causes a painful skin rash and fluid-filled blisters, which eventually break open, crust over, and heal. It may occur in any area of the body, but it usually affects only one side of the body or face. The pain of shingles usually lasts about 1 month. However, some people with shingles may develop long-term (chronic) pain in the affected area of the body. °Shingles often occurs many years after the person had chickenpox. It is more common: °· In people older than 50 years. °· In people with weakened immune systems, such as those with HIV, AIDS, or cancer. °· In people taking medicines that weaken the immune system, such as transplant medicines. °· In people under great stress. °CAUSES  °Shingles is caused by the varicella zoster virus (VZV), which also causes chickenpox. After a person is infected with the virus, it can remain in the person's body for years in an inactive state (dormant). To cause shingles, the virus reactivates and breaks out as an infection in a nerve root. °The virus can be spread from person to person (contagious) through contact with open blisters of the shingles rash. It will only spread to people who have not had chickenpox. When these people are exposed to the virus, they may develop chickenpox. They will not develop shingles. Once the blisters scab over, the person is no longer contagious and cannot spread the virus to others. °SYMPTOMS  °Shingles shows up in stages. The initial symptoms may be pain, itching, and tingling in an area of the skin. This pain is usually described as burning, stabbing, or throbbing. In a few days or weeks, a painful red rash will appear in the area where the pain, itching, and tingling were felt. The rash is usually on one side of the body in a band or belt-like pattern. Then, the rash usually turns into fluid-filled blisters. They  will scab over and dry up in approximately 2-3 weeks. °Flu-like symptoms may also occur with the initial symptoms, the rash, or the blisters. These may include: °· Fever. °· Chills. °· Headache. °· Upset stomach. °DIAGNOSIS  °Your caregiver will perform a skin exam to diagnose shingles. Skin scrapings or fluid samples may also be taken from the blisters. This sample will be examined under a microscope or sent to a lab for further testing. °TREATMENT  °There is no specific cure for shingles. Your caregiver will likely prescribe medicines to help you manage the pain, recover faster, and avoid long-term problems. This may include antiviral drugs, anti-inflammatory drugs, and pain medicines. °HOME CARE INSTRUCTIONS  °· Take a cool bath or apply cool compresses to the area of the rash or blisters as directed. This may help with the pain and itching.   °· Only take over-the-counter or prescription medicines as directed by your caregiver.   °· Rest as directed by your caregiver. °· Keep your rash and blisters clean with mild soap and cool water or as directed by your caregiver.  °· Do not pick your blisters or scratch your rash. Apply an anti-itch cream or numbing creams to the affected area as directed by your caregiver. °· Keep your shingles rash covered with a loose bandage (dressing). °· Avoid skin contact with: °¨ Babies.   °¨ Pregnant women.   °¨ Children with eczema.   °¨ Elderly people with transplants.   °¨ People with chronic illnesses, such as leukemia or AIDS.   °· Wear loose-fitting clothing to help ease the   pain of material rubbing against the rash. °· Keep all follow-up appointments with your caregiver. If the area involved is on your face, you may receive a referral for follow-up to a specialist, such as an eye doctor (ophthalmologist) or an ear, nose, and throat (ENT) doctor. Keeping all follow-up appointments will help you avoid eye complications, chronic pain, or disability.   °SEEK IMMEDIATE MEDICAL  CARE IF:  °· You have facial pain, pain around the eye area, or loss of feeling on one side of your face. °· You have ear pain or ringing in your ear. °· You have loss of taste. °· Your pain is not relieved with prescribed medicines.   °· Your redness or swelling spreads.   °· You have more pain and swelling.  °· Your condition is worsening or has changed.   °· You have a fever or persistent symptoms for more than 2-3 days. °· You have a fever and your symptoms suddenly get worse. °MAKE SURE YOU: °· Understand these instructions. °· Will watch your condition. °· Will get help right away if you are not doing well or get worse. °Document Released: 10/25/2005 Document Revised: 07/19/2012 Document Reviewed: 06/08/2012 °ExitCare® Patient Information ©2015 [employer]. This information is not intended to replace advice given to you by your health care provider. Make sure you discuss any questions you have with your health care provider. ° °

## 2014-09-16 ENCOUNTER — Ambulatory Visit (INDEPENDENT_AMBULATORY_CARE_PROVIDER_SITE_OTHER): Payer: BC Managed Care – PPO | Admitting: Family Medicine

## 2014-09-16 VITALS — BP 126/80 | HR 88 | Temp 98.7°F | Resp 17 | Ht 64.5 in | Wt 235.0 lb

## 2014-09-16 DIAGNOSIS — R059 Cough, unspecified: Secondary | ICD-10-CM

## 2014-09-16 DIAGNOSIS — J040 Acute laryngitis: Secondary | ICD-10-CM

## 2014-09-16 DIAGNOSIS — R05 Cough: Secondary | ICD-10-CM

## 2014-09-16 MED ORDER — AMOXICILLIN 875 MG PO TABS: 875.0000 mg | ORAL_TABLET | Freq: Two times a day (BID) | ORAL | Status: DC

## 2014-09-16 MED ORDER — BENZONATATE 100 MG PO CAPS: 100.0000 mg | ORAL_CAPSULE | Freq: Three times a day (TID) | ORAL | Status: DC | PRN

## 2014-09-16 NOTE — Progress Notes (Signed)
Subjective: 58 year old lady who is here with a two-week history of a respiratory tract infection. She had cold-like symptoms and a cough. The upper respiratory symptoms seem to improve except she persisted with a hoarseness. She also is persisted with intermittent episodes of bad coughing. She is not a smoker. She has felt generally tired. No defined fevers though she has felt flushed at times. She works in [employer] and puts in long hours being gone from home.  Objective: Pleasant lady, alert and oriented. TMs normal. Throat clear. Neck supple without significant nodes. Chest is clear to auscultation. Heart regular without murmurs gallops or arrhythmias.  Assessment: Pharyngitis and bronchitis/cough  Plan: Amoxicillin 875 one twice daily Tessalon cough pills Continue using her Mucinex at bedtime Return if not improving

## 2014-09-16 NOTE — Patient Instructions (Signed)
Drink plenty of fluids and get enough rest  Take the Tessalon cough pills one or 2 pills 3 times daily as needed for cough  Take the amoxicillin one twice daily for 10 days  Return if worse  Continue taking the Mucinex at bedtime

## 2014-11-12 ENCOUNTER — Encounter: Payer: Self-pay | Admitting: Physician Assistant

## 2014-11-12 ENCOUNTER — Ambulatory Visit (INDEPENDENT_AMBULATORY_CARE_PROVIDER_SITE_OTHER): Payer: BC Managed Care – PPO | Admitting: Physician Assistant

## 2014-11-12 VITALS — BP 144/91 | HR 90 | Temp 98.1°F | Resp 16 | Ht 63.5 in | Wt 241.0 lb

## 2014-11-12 DIAGNOSIS — R05 Cough: Secondary | ICD-10-CM

## 2014-11-12 DIAGNOSIS — R5383 Other fatigue: Secondary | ICD-10-CM | POA: Diagnosis not present

## 2014-11-12 DIAGNOSIS — E785 Hyperlipidemia, unspecified: Secondary | ICD-10-CM | POA: Diagnosis not present

## 2014-11-12 DIAGNOSIS — J040 Acute laryngitis: Secondary | ICD-10-CM

## 2014-11-12 DIAGNOSIS — I10 Essential (primary) hypertension: Secondary | ICD-10-CM | POA: Diagnosis not present

## 2014-11-12 DIAGNOSIS — R059 Cough, unspecified: Secondary | ICD-10-CM

## 2014-11-12 LAB — LIPID PANEL
Cholesterol: 313 mg/dL — ABNORMAL HIGH (ref 0–200)
HDL: 55 mg/dL (ref >39)
LDL CALC: 197 mg/dL — AB (ref 0–99)
Total CHOL/HDL Ratio: 5.7 Ratio
Triglycerides: 306 mg/dL — ABNORMAL HIGH (ref <150)
VLDL: 61 mg/dL — ABNORMAL HIGH (ref 0–40)

## 2014-11-12 LAB — COMPREHENSIVE METABOLIC PANEL
ALT: 11 U/L (ref 0–35)
AST: 18 U/L (ref 0–37)
Albumin: 3.9 g/dL (ref 3.5–5.2)
Alkaline Phosphatase: 101 U/L (ref 39–117)
BUN: 10 mg/dL (ref 6–23)
CO2: 28 meq/L (ref 19–32)
Calcium: 9 mg/dL (ref 8.4–10.5)
Chloride: 102 mEq/L (ref 96–112)
Creat: 0.62 mg/dL (ref 0.50–1.10)
GLUCOSE: 108 mg/dL — AB (ref 70–99)
Potassium: 4.4 mEq/L (ref 3.5–5.3)
SODIUM: 141 meq/L (ref 135–145)
TOTAL PROTEIN: 7.4 g/dL (ref 6.0–8.3)
Total Bilirubin: 0.5 mg/dL (ref 0.2–1.2)

## 2014-11-12 LAB — CBC WITH DIFFERENTIAL/PLATELET
BASOS PCT: 0 % (ref 0–1)
Basophils Absolute: 0 10*3/uL (ref 0.0–0.1)
Eosinophils Absolute: 0.4 10*3/uL (ref 0.0–0.7)
Eosinophils Relative: 4 % (ref 0–5)
HCT: 39.3 % (ref 36.0–46.0)
HEMOGLOBIN: 13.4 g/dL (ref 12.0–15.0)
Lymphocytes Relative: 28 % (ref 12–46)
Lymphs Abs: 2.7 10*3/uL (ref 0.7–4.0)
MCH: 29.5 pg (ref 26.0–34.0)
MCHC: 34.1 g/dL (ref 30.0–36.0)
MCV: 86.6 fL (ref 78.0–100.0)
MPV: 11.7 fL (ref 8.6–12.4)
Monocytes Absolute: 0.6 10*3/uL (ref 0.1–1.0)
Monocytes Relative: 6 % (ref 3–12)
NEUTROS PCT: 62 % (ref 43–77)
Neutro Abs: 6 10*3/uL (ref 1.7–7.7)
PLATELETS: 249 10*3/uL (ref 150–400)
RBC: 4.54 MIL/uL (ref 3.87–5.11)
RDW: 14.9 % (ref 11.5–15.5)
WBC: 9.6 10*3/uL (ref 4.0–10.5)

## 2014-11-12 LAB — TSH: TSH: 4.005 u[IU]/mL (ref 0.350–4.500)

## 2014-11-12 MED ORDER — PANTOPRAZOLE SODIUM 40 MG PO TBEC: 40.0000 mg | DELAYED_RELEASE_TABLET | Freq: Two times a day (BID) | ORAL | Status: DC

## 2014-11-12 NOTE — Progress Notes (Signed)
Subjective:    Patient ID: Rebecca [name], female    DOB: 1955/12/13, 59 y.o.   MRN: [id number]   PCP: Ceasar Decandia, PA-C  Chief Complaint  Patient presents with  . Follow-up    Has not gotten voice back  . Laryngitis  . Cough    Allergies  Allergen Reactions  . Prednisolone Other (See Comments)    Patient can't remember     Patient Active Problem List   Diagnosis Date Noted  . Special screening for malignant neoplasms, colon 09/17/2013  . Obesity, unspecified 01/16/2013  . Spinal stenosis in cervical region 01/11/2013  . Multiple thyroid nodules 01/11/2013  . Hand pain 06/16/2012  . Hyperlipidemia 06/16/2012    Prior to Admission medications   Medication Sig Start Date End Date Taking? Authorizing Provider  Multiple Vitamins-Minerals (MULTIVITAMIN PO) Take 1 tablet by mouth.   Yes Historical Provider, MD    Medical, Surgical, Family and Social History reviewed and updated.  HPI Presents for evaluation of continued cough and laryngitis.  Out of work since 12/22-"I'm just tired." Took vacation to rest up. Gets braces tomorrow. In addition, plans to start lifestyle modification program soon. "I've got to do something about my weight. I'm not exercising like I should." Is looking into the [employer] Weight Solution-Healthy meal plan, supplement support, fiber, including other safe weight loss modalities including a small amount of HCG. 6 week "treatment" program, followed by 3 weeks of transitional support.  Seen here 09/16/2014 with laryngitis and cough. Treated with amoxicillin, OTC Mucinex and Tessalon Perles.  Cough is better, less persistent and less frequent, but still present. Sometimes when she coughs she coughs up mucous-clear in color. When voice begins to fade, cough worsens. Describes voice as raspy. Seems to improve, and then worsen again, without rhyme or reason. No increased belching or bowel gas. No abdominal pain, nausea or vomiting. She  does describe heartburn and indigestion. No fever, chills, diarrhea, HA, dizziness. No CP, SOB. No difficulty or pain with swallowing. No sore throat.  Review of Systems As above.    Objective:   Physical Exam  Constitutional: She is oriented to person, place, and time. She appears well-developed and well-nourished. No distress.  BP 144/91 mmHg  Pulse 90  Temp(Src) 98.1 F (36.7 C) (Oral)  Resp 16  Ht 5' 3.5" (1.613 m)  Wt 241 lb (109.317 kg)  BMI 42.02 kg/m2  SpO2 95%   Eyes: Conjunctivae are normal. No scleral icterus.  Neck: No thyromegaly present.  Cardiovascular: Normal rate, regular rhythm, normal heart sounds and intact distal pulses.   Pulmonary/Chest: Effort normal and breath sounds normal.  Lymphadenopathy:    She has no cervical adenopathy.  Neurological: She is alert and oriented to person, place, and time.  Skin: Skin is warm and dry.  Psychiatric: She has a normal mood and affect. Her behavior is normal.          Assessment & Plan:  1. Laryngitis 2. Cough Suspect LPR. PPI BID. Counseled on possible triggers to reduce/avoid. - Ambulatory referral to ENT - pantoprazole (PROTONIX) 40 MG tablet; Take 1 tablet (40 mg total) by mouth 2 (two) times daily.  Dispense: 60 tablet; Refill: 3  3. Essential hypertension Above goal today, but usually well-controlled. Continue efforts for healthy lifestyle changes. - CBC with Differential - Comprehensive metabolic panel - TSH  4. Hyperlipidemia Await labs. Continue efforts for weight loss. - Lipid panel  5. Other fatigue Await labs, above. Increase oral hydration, exercise and stress  relieving activities.  Return in about 6 months (around 05/13/2015), or if symptoms worsen or fail to improve.    Fara Chute, PA-C Physician Assistant-Certified [facility]

## 2014-11-12 NOTE — Patient Instructions (Addendum)
Get 150 min of cardiovascular exercise each week. Make healthy eating choices.  Google LPR (laryngopharyngeal reflux)

## 2014-11-14 ENCOUNTER — Encounter: Payer: Self-pay | Admitting: Physician Assistant

## 2014-11-27 ENCOUNTER — Encounter: Payer: Self-pay | Admitting: Physician Assistant

## 2014-11-27 DIAGNOSIS — K219 Gastro-esophageal reflux disease without esophagitis: Secondary | ICD-10-CM | POA: Insufficient documentation

## 2015-04-17 ENCOUNTER — Encounter: Payer: Self-pay | Admitting: *Deleted

## 2015-06-09 ENCOUNTER — Encounter: Payer: Self-pay | Admitting: *Deleted

## 2016-02-09 ENCOUNTER — Ambulatory Visit (INDEPENDENT_AMBULATORY_CARE_PROVIDER_SITE_OTHER): Payer: BC Managed Care – PPO | Admitting: Physician Assistant

## 2016-02-09 VITALS — BP 120/78 | HR 87 | Temp 98.7°F | Resp 17 | Ht 63.5 in | Wt 199.0 lb

## 2016-02-09 DIAGNOSIS — N762 Acute vulvitis: Secondary | ICD-10-CM | POA: Diagnosis not present

## 2016-02-09 MED ORDER — DOXYCYCLINE HYCLATE 100 MG PO CAPS: 100.0000 mg | ORAL_CAPSULE | Freq: Two times a day (BID) | ORAL | Status: AC

## 2016-02-09 NOTE — Progress Notes (Signed)
   Subjective:    Patient ID: Rebecca [name], female    DOB: 05/18/1956, 60 y.o.   MRN: [id number]  HPI Rebecca [name] is a 60 yo AA female with pmh significant for hyperlipidemia that is here for a boil on her groin. Patient states that approx 1-2 weeks ago she noticed a "bump" in her right groin. She started using warm compress and soaking in Epson salt with little relief. She stated that yesterday the area increased in size and it was painful to walk or sit. She denies any fever, chills, N/V/D, abd pain, urinary symptoms, or vaginal disharge.    Review of Systems  Constitutional: Negative for fever and chills.  Respiratory: Negative for shortness of breath.   Cardiovascular: Negative for chest pain.  Gastrointestinal: Negative for nausea, vomiting, abdominal pain and diarrhea.  Genitourinary: Negative for dysuria, urgency and frequency.       Boil on right inner groin, denies any discharge, redness, or pruritis   Allergies  Allergen Reactions  . Prednisolone Other (See Comments)    Patient can't remember    No current outpatient prescriptions on file prior to visit.   No current facility-administered medications on file prior to visit.       Objective:   Physical Exam  Constitutional: She is oriented to person, place, and time. She appears well-developed and well-nourished. No distress.  Cardiovascular: Normal rate, regular rhythm and normal heart sounds.   Pulmonary/Chest: Effort normal and breath sounds normal.  Abdominal: Soft. Bowel sounds are normal.  Genitourinary: There is tenderness and lesion (swelling) on the right labia.  Swelling of the right labia majora, no area of fluctuance of induration noted, no swelling or tenderness into the introitus, right labia unremarkable   Neurological: She is alert and oriented to person, place, and time.  Skin: Skin is warm and dry.          Assessment & Plan:  1. Cellulitis of labia majora No area of fluctuance or  induration. Will treat patient with abx and if symptoms fail to improve or worsen in the next 48 hour patient to return to clinic for re evaluation and possible I&D. - doxycycline (VIBRAMYCIN) 100 MG capsule; Take 1 capsule (100 mg total) by mouth 2 (two) times daily.  Dispense: 20 capsule; Refill: 0 - OTC NSAID as need for pain - Continue warm compresses for swelling   Melina Schools PA-S 02/09/2016

## 2016-02-09 NOTE — Progress Notes (Signed)
   Patient ID: Rebecca [name], female    DOB: [birth year], 60 y.o.   MRN: [id number]  PCP: Keegen Heffern, PA-C  Subjective:   Chief Complaint  Patient presents with  . boil on groin    HPI Presents for evaluation of a boil on her groin.   Patient states that approx 1-2 weeks ago she noticed a "bump" in her right groin. She started using warm compress and soaking in Epson salt with little relief. She stated that yesterday the area increased in size and it was painful to walk or sit. She denies any fever, chills, N/V/D, abd pain, urinary symptoms, or vaginal disharge.     Review of Systems Constitutional: Negative for fever and chills.  Respiratory: Negative for shortness of breath.  Cardiovascular: Negative for chest pain.  Gastrointestinal: Negative for nausea, vomiting, abdominal pain and diarrhea.  Genitourinary: Negative for dysuria, urgency and frequency.   Boil on right inner groin, denies any discharge, redness, or pruritis     Patient Active Problem List   Diagnosis Date Noted  . Acid reflux disease 11/27/2014  . Special screening for malignant neoplasms, colon 09/17/2013  . Obesity, unspecified 01/16/2013  . Spinal stenosis in cervical region 01/11/2013  . Multiple thyroid nodules 01/11/2013  . Hand pain 06/16/2012  . Hyperlipidemia 06/16/2012     Prior to Admission medications   Medication Sig Start Date End Date Taking? Authorizing Provider  pyridOXINE (VITAMIN B-6) 100 MG tablet Take 100 mg by mouth daily.   Yes Historical Provider, MD  doxycycline (VIBRAMYCIN) 100 MG capsule Take 1 capsule (100 mg total) by mouth 2 (two) times daily. 02/09/16 02/19/16  Harrison Mons, PA-C     Allergies  Allergen Reactions  . Prednisolone Other (See Comments)    Patient can't remember        Objective:  Physical Exam  Constitutional: She is oriented to person, place, and time. She appears well-developed and well-nourished. She is active and cooperative. No  distress.  BP 120/78 mmHg  Pulse 87  Temp(Src) 98.7 F (37.1 C) (Oral)  Resp 17  Ht 5' 3.5" (1.613 m)  Wt 199 lb (90.266 kg)  BMI 34.69 kg/m2  SpO2 96%   Eyes: Conjunctivae are normal.  Pulmonary/Chest: Effort normal.  Genitourinary:    Pelvic exam was performed with patient supine. There is tenderness on the right labia. There is no rash, lesion or injury on the right labia. There is no rash, tenderness, lesion or injury on the left labia.  Lymphadenopathy:       Right: No inguinal adenopathy present.       Left: No inguinal adenopathy present.  Neurological: She is alert and oriented to person, place, and time.  Psychiatric: She has a normal mood and affect. Her speech is normal and behavior is normal.           Assessment & Plan:   1. Cellulitis of labia majora No abscess palpable today. Continue warm compresses. Start Doxycycline. Consider I&D if not significantly improved in 48 hours, sooner if worsens. - doxycycline (VIBRAMYCIN) 100 MG capsule; Take 1 capsule (100 mg total) by mouth 2 (two) times daily.  Dispense: 20 capsule; Refill: 0   Fara Chute, PA-C Physician Assistant-Certified [facility]

## 2016-02-09 NOTE — Patient Instructions (Addendum)
Continue the antibiotic as prescribed. Apply a warm compress to the area for 15-20 minutes 2-4 times each day.     IF you received an x-ray today, you will receive an invoice from [facility] Rad[facility]. Please contact [facility] Radiology at [phone] with questions or concerns regarding your invoice.   IF you received labwork today, you will receive an invoice from [employer]. Please contact [facility] at [phone] with questions or concerns regarding your invoice.   Our billing staff will not be able to assist you with questions regarding bills from these companies.  You will be contacted with the lab results as soon as they are available. The fastest way to get your results is to activate your My Chart account. Instructions are located on the last page of this paperwork. If you have not heard from [place] regarding the results in 2 weeks, please contact this office.

## 2016-06-08 ENCOUNTER — Ambulatory Visit: Payer: BC Managed Care – PPO | Admitting: Physician Assistant

## 2016-06-08 ENCOUNTER — Telehealth: Payer: Self-pay | Admitting: Family Medicine

## 2016-06-08 ENCOUNTER — Ambulatory Visit (INDEPENDENT_AMBULATORY_CARE_PROVIDER_SITE_OTHER): Payer: BC Managed Care – PPO | Admitting: Family Medicine

## 2016-06-08 VITALS — BP 142/84 | HR 61 | Temp 97.9°F | Resp 17 | Ht 63.5 in | Wt 205.0 lb

## 2016-06-08 DIAGNOSIS — E785 Hyperlipidemia, unspecified: Secondary | ICD-10-CM | POA: Diagnosis not present

## 2016-06-08 DIAGNOSIS — B009 Herpesviral infection, unspecified: Secondary | ICD-10-CM

## 2016-06-08 DIAGNOSIS — G4700 Insomnia, unspecified: Secondary | ICD-10-CM | POA: Diagnosis not present

## 2016-06-08 DIAGNOSIS — Z0000 Encounter for general adult medical examination without abnormal findings: Secondary | ICD-10-CM

## 2016-06-08 DIAGNOSIS — F411 Generalized anxiety disorder: Secondary | ICD-10-CM

## 2016-06-08 DIAGNOSIS — Z131 Encounter for screening for diabetes mellitus: Secondary | ICD-10-CM | POA: Diagnosis not present

## 2016-06-08 DIAGNOSIS — M542 Cervicalgia: Secondary | ICD-10-CM | POA: Diagnosis not present

## 2016-06-08 DIAGNOSIS — N898 Other specified noninflammatory disorders of vagina: Secondary | ICD-10-CM

## 2016-06-08 MED ORDER — TRAZODONE HCL 50 MG PO TABS: 25.0000 mg | ORAL_TABLET | Freq: Every evening | ORAL | 3 refills | Status: DC | PRN

## 2016-06-08 MED ORDER — ESCITALOPRAM OXALATE 10 MG PO TABS: 10.0000 mg | ORAL_TABLET | Freq: Every day | ORAL | 1 refills | Status: DC

## 2016-06-08 NOTE — Patient Instructions (Addendum)
Start Lexapro today at bedtime 10 mg for anxiety. Start Trazadone 50 mg at bedtime for insomnia. Follow-up in 3 weeks. I will follow-up with lab results! Nice meeting you today!   [name]. [name], MSN, FNP-C [facility]     IF you received an x-ray today, you will receive an invoice from [facility] Radiology. Please contact [facility]iology at [phone] with questions or concerns regarding your invoice.   IF you received labwork today, you will receive an invoice from [employer]. Please contact [facility] at [phone] with questions or concerns regarding your invoice.   Our billing staff will not be able to assist you with questions regarding bills from these companies.  You will be contacted with the lab results as soon as they are available. The fastest way to get your results is to activate your My Chart account. Instructions are located on the last page of this paperwork. If you have not heard from [place] regarding the results in 2 weeks, please contact this office.     We recommend that you schedule a mammogram for breast cancer screening. Typically, you do not need a referral to do this. Please contact a local imaging center to schedule your mammogram.  [facility] - [phone]  *ask for the Radiology Department [facility] - [phone] or [phone]  [facility] - [phone] [place] [phone] [facility] - [phone]  *ask for the [facility] - [phone]  *ask for the Radiology Department [facility] - [phone]  *ask for the [facility] - [phone]

## 2016-06-08 NOTE — Telephone Encounter (Signed)
Patient came into our office to see Chelle for an appointment she wasn't on the DAR it look like her appointment had been cancelled which the patient states that she hit the Cancell button by mistake and called right back and someone told her that it was ok to just show up anyway The patient states that she didn't get a name I asked Chelle if we could see her Chelle wanted the patient to see Molli Barrows and that she would peek into the room to speak with the patient she was upset and rude didn't want to see anyone else states that she has been waiting on this appointment forever I tried to reschedule another appointment she didn't want it states that she want her medical records she's going to another practice I tried everything to please her she didn't want to hear it I advised her that she can come in on another day to see chelle at the walk-in appointment day she still didn't want to hear it patient states that she works along way out patient just stormed out mad
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
## 2016-06-09 LAB — LIPID PANEL
Cholesterol: 324 mg/dL — ABNORMAL HIGH (ref 125–200)
HDL: 89 mg/dL (ref >=46)
LDL Cholesterol: 194 mg/dL — ABNORMAL HIGH (ref <130)
Total CHOL/HDL Ratio: 3.6 Ratio (ref <=5.0)
Triglycerides: 205 mg/dL — ABNORMAL HIGH (ref <150)
VLDL: 41 mg/dL — ABNORMAL HIGH (ref <30)

## 2016-06-09 LAB — CBC WITH DIFFERENTIAL/PLATELET
BASOS ABS: 0 {cells}/uL (ref 0–200)
BASOS PCT: 0 %
EOS PCT: 2 %
Eosinophils Absolute: 202 cells/uL (ref 15–500)
HCT: 40.4 % (ref 35.0–45.0)
HEMOGLOBIN: 13.6 g/dL (ref 11.7–15.5)
LYMPHS ABS: 3737 {cells}/uL (ref 850–3900)
Lymphocytes Relative: 37 %
MCH: 30 pg (ref 27.0–33.0)
MCHC: 33.7 g/dL (ref 32.0–36.0)
MCV: 89 fL (ref 80.0–100.0)
MONOS PCT: 7 %
MPV: 11.3 fL (ref 7.5–12.5)
Monocytes Absolute: 707 cells/uL (ref 200–950)
NEUTROS ABS: 5454 {cells}/uL (ref 1500–7800)
Neutrophils Relative %: 54 %
PLATELETS: 256 10*3/uL (ref 140–400)
RBC: 4.54 MIL/uL (ref 3.80–5.10)
RDW: 14.4 % (ref 11.0–15.0)
WBC: 10.1 10*3/uL (ref 3.8–10.8)

## 2016-06-09 LAB — COMPLETE METABOLIC PANEL WITH GFR
ALBUMIN: 4.2 g/dL (ref 3.6–5.1)
ALK PHOS: 85 U/L (ref 33–130)
ALT: 13 U/L (ref 6–29)
AST: 18 U/L (ref 10–35)
BILIRUBIN TOTAL: 0.6 mg/dL (ref 0.2–1.2)
BUN: 11 mg/dL (ref 7–25)
CO2: 30 mmol/L (ref 20–31)
CREATININE: 0.64 mg/dL (ref 0.50–1.05)
Calcium: 9.5 mg/dL (ref 8.6–10.4)
Chloride: 104 mmol/L (ref 98–110)
GFR, Est African American: >89 mL/min (ref >=60)
GLUCOSE: 79 mg/dL (ref 65–99)
Potassium: 4.5 mmol/L (ref 3.5–5.3)
SODIUM: 143 mmol/L (ref 135–146)
TOTAL PROTEIN: 7.7 g/dL (ref 6.1–8.1)

## 2016-06-09 LAB — TSH: TSH: 1.9 mIU/L

## 2016-06-09 LAB — HEMOGLOBIN A1C
Hgb A1c MFr Bld: 6.2 % — ABNORMAL HIGH (ref <5.7)
Mean Plasma Glucose: 131 mg/dL

## 2016-06-09 LAB — MICROALBUMIN, URINE: MICROALB UR: 25.8 mg/dL

## 2016-06-10 LAB — HSV(HERPES SIMPLEX VRS) I + II AB-IGG
HSV 1 Glycoprotein G Ab, IgG: 39 Index — ABNORMAL HIGH (ref <0.90)
HSV 2 Glycoprotein G Ab, IgG: 2.87 Index — ABNORMAL HIGH (ref <0.90)

## 2016-06-10 MED ORDER — ACYCLOVIR 800 MG PO TABS: 800.0000 mg | ORAL_TABLET | Freq: Three times a day (TID) | ORAL | 3 refills | Status: DC

## 2016-06-10 NOTE — Telephone Encounter (Signed)
Attempted to call patient to advise about HSV-1 lab result was positive. Sent Acyclovir 800 mg, TID, x 5 days to the pharmacy to control active outbreak. Patient had no voicemail available to leave a request for a return phone call.  A1c was also elevated  6.2 prediabetes range, need to recheck in 3 months.  Lipids were also elevated with LDL 194, Triglycerides 205,and Cholesterol 324.  I would like for patient to consider statin therapy to reduce cardiovascular risk factors.   Rebecca [name]. [name], MSN, FNP-C [facility]

## 2016-06-17 ENCOUNTER — Telehealth: Payer: Self-pay

## 2016-06-17 NOTE — Telephone Encounter (Signed)
Please see my note regarding lab result and my attempt to contact the patient on 06/10/2016 at the number listed on file. Please call with results and advise medication has been called into the pharmacy.  Thanks,  Molli Barrows   See note below dated  06/10/16  Attempted to call patient to advise about HSV-1 lab result was positive. Sent Acyclovir 800 mg, TID, x 5 days to the pharmacy to control active outbreak. Patient had no voicemail available to leave a request for a return phone call.  A1c was also elevated  6.2 prediabetes range, need to recheck in 3 months.  Lipids were also elevated with LDL 194, Triglycerides 205,and Cholesterol 324.  I would like for patient to consider statin therapy to reduce cardiovascular risk factors.   [name]. [name], MSN, FNP-C [facility]

## 2016-06-17 NOTE — Telephone Encounter (Signed)
Patient is calling because she hasn't hear anything back from lab. Please call!  [phone]

## 2016-06-17 NOTE — Telephone Encounter (Signed)
Please review

## 2016-06-18 ENCOUNTER — Encounter: Payer: Self-pay | Admitting: Family Medicine

## 2016-06-18 ENCOUNTER — Telehealth: Payer: Self-pay | Admitting: Emergency Medicine

## 2016-06-18 NOTE — Telephone Encounter (Signed)
Pt given HSV-1 results with instructions to take prescribed medication Acyclovir x5 days.

## 2016-06-18 NOTE — Telephone Encounter (Signed)
Spoke with pt about her labs, but pt. Seemed a little upset, I just asked Sharee Pimple to speak to pt. Further.

## 2016-07-01 ENCOUNTER — Ambulatory Visit (INDEPENDENT_AMBULATORY_CARE_PROVIDER_SITE_OTHER): Payer: BC Managed Care – PPO | Admitting: Family Medicine

## 2016-07-01 VITALS — BP 118/80 | HR 71 | Temp 98.2°F | Resp 18 | Ht 63.5 in | Wt 205.0 lb

## 2016-07-01 DIAGNOSIS — M542 Cervicalgia: Secondary | ICD-10-CM

## 2016-07-01 DIAGNOSIS — B009 Herpesviral infection, unspecified: Secondary | ICD-10-CM | POA: Diagnosis not present

## 2016-07-01 DIAGNOSIS — F411 Generalized anxiety disorder: Secondary | ICD-10-CM

## 2016-07-01 MED ORDER — CYCLOBENZAPRINE HCL 10 MG PO TABS: 10.0000 mg | ORAL_TABLET | Freq: Three times a day (TID) | ORAL | 0 refills | Status: DC | PRN

## 2016-07-01 MED ORDER — ESCITALOPRAM OXALATE 10 MG PO TABS: 10.0000 mg | ORAL_TABLET | Freq: Every day | ORAL | 1 refills | Status: DC
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
## 2016-07-01 NOTE — Patient Instructions (Addendum)
Referral for counseling services through [facility] behavioral health submitted. They will contact you to schedule an appointment.   Refill submitted for Lexapro 10 mg.  Follow-up in 4 months for cholesterol and hemoglobin A1C recheck.  Take care and I'll see you in 4 months!  [name]. [name], MSN, FNP-C [facility]  IF you received an x-ray today, you will receive an invoice from [facility]. Please contact [facility] at [phone] with questions or concerns regarding your invoice.   IF you received labwork today, you will receive an invoice from [employer]. Please contact [facility] at [phone] with questions or concerns regarding your invoice.   Our billing staff will not be able to assist you with questions regarding bills from these companies.  You will be contacted with the lab results as soon as they are available. The fastest way to get your results is to activate your My Chart account. Instructions are located on the last page of this paperwork. If you have not heard from [place] regarding the results in 2 weeks, please contact this office.     Heart-Healthy Eating Plan Heart-healthy meal planning includes:  Limiting unhealthy fats.  Increasing healthy fats.  Making other small dietary changes. You may need to talk with your doctor or a diet specialist (dietitian) to create an eating plan that is right for you. WHAT TYPES OF FAT SHOULD I CHOOSE?  Choose healthy fats. These include olive oil and canola oil, flaxseeds, walnuts, almonds, and seeds.  Eat more omega-3 fats. These include salmon, mackerel, sardines, tuna, flaxseed oil, and ground flaxseeds. Try to eat fish at least twice each week.  Limit saturated fats.  Saturated fats are often found in animal products, such as meats, butter, and cream.  Plant sources of saturated fats include palm oil, palm kernel oil, and coconut  oil.  Avoid foods with partially hydrogenated oils in them. These include stick margarine, some tub margarines, cookies, crackers, and other baked goods. These contain trans fats. WHAT GENERAL GUIDELINES DO I NEED TO FOLLOW?  Check food labels carefully. Identify foods with trans fats or high amounts of saturated fat.  Fill one half of your plate with vegetables and green salads. Eat 4-5 servings of vegetables per day. A serving of vegetables is:  1 cup of raw leafy vegetables.   cup of raw or cooked cut-up vegetables.   cup of vegetable juice.  Fill one fourth of your plate with whole grains. Look for the word "whole" as the first word in the ingredient list.  Fill one fourth of your plate with lean protein foods.  Eat 4-5 servings of fruit per day. A serving of fruit is:  One medium whole fruit.   cup of dried fruit.   cup of fresh, frozen, or canned fruit.   cup of 100% fruit juice.  Eat more foods that contain soluble fiber. These include apples, broccoli, carrots, beans, peas, and barley. Try to get 20-30 g of fiber per day.  Eat more home-cooked food. Eat less restaurant, buffet, and fast food.  Limit or avoid alcohol.  Limit foods high in starch and sugar.  Avoid fried foods.  Avoid frying your food. Try baking, boiling, grilling, or broiling it instead. You can also reduce fat by:  Removing the skin from poultry.  Removing all visible fats from meats.  Skimming the fat off of stews, soups, and gravies before serving them.  Steaming vegetables in water or  broth.  Lose weight if you are overweight.  Eat 4-5 servings of nuts, legumes, and seeds per week:  One serving of dried beans or legumes equals  cup after being cooked.  One serving of nuts equals 1 ounces.  One serving of seeds equals  ounce or one tablespoon.  You may need to keep track of how much salt or sodium you eat. This is especially true if you have high blood pressure. Talk with  your doctor or dietitian to get more information. WHAT FOODS CAN I EAT? Grains Breads, including [place], white, pita, wheat, raisin, rye, oatmeal, and [place]. Tortillas that are neither fried nor made with lard or trans fat. Low-fat rolls, including hotdog and hamburger buns and English muffins. Biscuits. Muffins. Waffles. Pancakes. Light popcorn. Whole-grain cereals. Flatbread. Melba toast. Pretzels. Breadsticks. Rusks. Low-fat snacks. Low-fat crackers, including oyster, saltine, matzo, graham, animal, and rye. Rice and pasta, including brown rice and pastas that are made with whole wheat.  Vegetables All vegetables.  Fruits All fruits, but limit coconut. Meats and Other Protein Sources Lean, well-trimmed beef, veal, pork, and lamb. Chicken and [place] without skin. All fish and shellfish. Wild duck, rabbit, pheasant, and venison. Egg whites or low-cholesterol egg substitutes. Dried beans, peas, lentils, and tofu. Seeds and most nuts. Dairy Low-fat or nonfat cheeses, including ricotta, string, and mozzarella. Skim or 1% milk that is liquid, powdered, or evaporated. Buttermilk that is made with low-fat milk. Nonfat or low-fat yogurt. Beverages Mineral water. Diet carbonated beverages. Sweets and Desserts Sherbets and fruit ices. Honey, jam, marmalade, jelly, and syrups. Meringues and gelatins. Pure sugar candy, such as hard candy, jelly beans, gumdrops, mints, marshmallows, and small amounts of dark chocolate. [employer] Eat all sweets and desserts in moderation. Fats and Oils Nonhydrogenated (trans-free) margarines. Vegetable oils, including soybean, sesame, sunflower, olive, peanut, safflower, corn, canola, and cottonseed. Salad dressings or mayonnaise made with a vegetable oil. Limit added fats and oils that you use for cooking, baking, salads, and as spreads. Other Cocoa powder. Coffee and tea. All seasonings and condiments. The items listed above may not be a complete list of  recommended foods or beverages. Contact your dietitian for more options. WHAT FOODS ARE NOT RECOMMENDED? Grains Breads that are made with saturated or trans fats, oils, or whole milk. Croissants. Butter rolls. Cheese breads. Sweet rolls. Donuts. Buttered popcorn. Chow mein noodles. High-fat crackers, such as cheese or butter crackers. Meats and Other Protein Sources Fatty meats, such as hotdogs, short ribs, sausage, spareribs, bacon, rib eye roast or steak, and mutton. High-fat deli meats, such as salami and bologna. Caviar. Domestic duck and goose. Organ meats, such as kidney, liver, sweetbreads, and heart. Dairy Cream, sour cream, cream cheese, and creamed cottage cheese. Whole-milk cheeses, including blue (bleu), Monterey Jack, [place], [place], American, [place], Swiss, cheddar, [place], and [place]. Whole or 2% milk that is liquid, evaporated, or condensed. Whole buttermilk. Cream sauce or high-fat cheese sauce. Yogurt that is made from whole milk. Beverages Regular sodas and juice drinks with added sugar. Sweets and Desserts Frosting. Pudding. Cookies. Cakes other than angel food cake. Candy that has milk chocolate or white chocolate, hydrogenated fat, butter, coconut, or unknown ingredients. Buttered syrups. Full-fat ice cream or ice cream drinks. Fats and Oils Gravy that has suet, meat fat, or shortening. Cocoa butter, hydrogenated oils, palm oil, coconut oil, palm kernel oil. These can often be found in baked products, candy, fried foods, nondairy creamers, and whipped toppings. Solid fats and shortenings, including  bacon fat, salt pork, lard, and butter. Nondairy cream substitutes, such as coffee creamers and sour cream substitutes. Salad dressings that are made of unknown oils, cheese, or sour cream. The items listed above may not be a complete list of foods and beverages to avoid. Contact your dietitian for more information.   This information is not intended to replace advice given to you  by your health care provider. Make sure you discuss any questions you have with your health care provider.   Document Released: 04/25/2012 Document Revised: 11/15/2014 Document Reviewed: 04/18/2014 Elsevier Interactive Patient Education [employer].

## 2016-07-03 DIAGNOSIS — B009 Herpesviral infection, unspecified: Secondary | ICD-10-CM | POA: Insufficient documentation

## 2016-07-03 DIAGNOSIS — M542 Cervicalgia: Secondary | ICD-10-CM | POA: Insufficient documentation

## 2016-07-03 DIAGNOSIS — F411 Generalized anxiety disorder: Secondary | ICD-10-CM | POA: Insufficient documentation

## 2016-07-08 ENCOUNTER — Telehealth: Payer: Self-pay

## 2016-07-08 NOTE — Telephone Encounter (Signed)
PATIENT STATES SHE SAW KIMBERLY HARRIS ABOUT A WEEK AGO. SHE WOULD LIKE HER TO CALL BACK AS SOON AS POSSIBLE. SHE WOULD ONLY SAY "IT'S  ABOUT A STRESSFUL SITUATION." BEST PHONE [phone] (CELL)  Joy

## 2016-07-10 NOTE — Telephone Encounter (Signed)
Patient is having significantly difficulty at work. She reports that she is not sleeping, increasing depression, and anxiety.  She feels that she really needs sometime away from work to take her medication consistently and get in with a behavioral health counselor.  A previous referral was already submitted but she reports no one has contacted her.  I advised her to come into the office on Tuesday and schedule an appointment with me.  I will write her out of work and we will discuss increasing her escitalopram dosage.  [name]. [name], MSN, FNP-C [facility]

## 2016-07-13 ENCOUNTER — Ambulatory Visit (INDEPENDENT_AMBULATORY_CARE_PROVIDER_SITE_OTHER): Payer: BC Managed Care – PPO | Admitting: Family Medicine

## 2016-07-13 VITALS — BP 178/94 | HR 73 | Temp 98.3°F | Resp 18 | Ht 62.0 in | Wt 210.2 lb

## 2016-07-13 DIAGNOSIS — IMO0001 Reserved for inherently not codable concepts without codable children: Secondary | ICD-10-CM

## 2016-07-13 DIAGNOSIS — F411 Generalized anxiety disorder: Secondary | ICD-10-CM

## 2016-07-13 DIAGNOSIS — R030 Elevated blood-pressure reading, without diagnosis of hypertension: Secondary | ICD-10-CM | POA: Diagnosis not present

## 2016-07-13 MED ORDER — ESCITALOPRAM OXALATE 10 MG PO TABS: 20.0000 mg | ORAL_TABLET | Freq: Every day | ORAL | 1 refills | Status: DC

## 2016-07-13 NOTE — Patient Instructions (Addendum)
Return in 2 weeks for follow-up.     IF you received an x-ray today, you will receive an invoice from [facility]. Please contact [facility] at [phone] with questions or concerns regarding your invoice.   IF you received labwork today, you will receive an invoice from [employer]. Please contact [facility] at [phone] with questions or concerns regarding your invoice.   Our billing staff will not be able to assist you with questions regarding bills from these companies.  You will be contacted with the lab results as soon as they are available. The fastest way to get your results is to activate your My Chart account. Instructions are located on the last page of this paperwork. If you have not heard from [place] regarding the results in 2 weeks, please contact this office.

## 2016-07-13 NOTE — Progress Notes (Signed)
Patient ID: Rebecca [name], female    DOB: [birth year], 58 y.o.   MRN: [id number]  PCP: Harrison Mons, PA-C  Chief Complaint  Patient presents with  . To talk about Anxiety meds. she got    Subjective:   HPI 60 year old female presents for evaluation of anxiety and work-related stress.   Patient describes an increase and work-related stress mixed with some depressed mood after returning from vacation.  Patient has been dealing with some significant work related conflicts which has resulted in her file and a complaint with human resources against her Contractor. She reports upon return from vacation she received notification that her report was unfounded.  She immediately received in unfixed satisfactory work performance from her Contractor who was the subject of her human resources complaint. Feels her supervisor is directly retaliating against her. He reports not sleeping very much except for on the weekends when she is able to take the trazodone.  She has to awaken very early in the morning and unless she can get a complete 8-10 hours of sleep she typically doesn't take it during the week.  She feels that the escitalopram needs to be increased  and she is requesting some time off work to obtain some behavioral health counseling and "get herself together".  She reports she will be filing for FMLA at work.  . Social History   Social History  . Marital status: Single    Spouse name: N/A  . Number of children: 0  . Years of education: 18.5   Occupational History  . Retired [employer]    [employer] Services x 30 years  . 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 Smoking status: Never Smoker  . Smokeless tobacco: Never Used  . Alcohol use 2.5 - 3.5 oz/week    5 - 7 drink(s) per week     Comment: 4-5 * week/ 1-2 drinks  . Drug use: No  . Sexual activity: Yes    Partners: Male    Birth control/ protection:  Surgical   Other Topics Concern  . Not on file   Social History Narrative   Close friend/significant other killed (GSW) 2012.   Retired after 30 years, and took another job (commutes to [place] 4 days/week).    . Family History  Problem Relation Age of Onset  . Cancer Mother     pancreatic cancer  . Diabetes Mother   . Heart disease Mother     rheumatic heart disease  . Hyperlipidemia Mother   . COPD Sister   . Sleep apnea Sister   . Cancer Sister     Lung Cancer  . Cancer Brother     lung cancer  . Cancer Father     renal, bone  . Diabetes Father   . Hyperlipidemia Father   . Heart attack Maternal Grandfather   . Kidney disease Brother   . Colon cancer Maternal Aunt     Review of Systems  Constitutional: Positive for fatigue.  Respiratory: Negative.   Cardiovascular: Positive for palpitations.  Neurological: Positive for headaches.  Psychiatric/Behavioral: Positive for decreased concentration and sleep disturbance. The patient is nervous/anxious.        She denies thoughts of self-harm or thoughts of harming others.  See history of present illness   Patient Active Problem List   Diagnosis Date Noted  . Anxiety state 07/03/2016  . Neck pain 07/03/2016  . 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 Acid reflux disease 11/27/2014  . Special screening for malignant neoplasms, colon 09/17/2013  . Obesity, unspecified 01/16/2013  . Spinal stenosis in cervical region 01/11/2013  . Multiple thyroid nodules 01/11/2013  . Hand pain 06/16/2012  . Hyperlipidemia 06/16/2012     Prior to Admission medications   Medication Sig Start Date End Date Taking? Authorizing Provider  cyclobenzaprine (FLEXERIL) 10 MG tablet Take 1 tablet (10 mg total) by mouth 3 (three) times daily as needed for muscle spasms. 07/01/16  Yes Sedalia Muta, FNP  escitalopram (LEXAPRO) 10 MG tablet Take 1 tablet (10 mg total) by mouth at bedtime. 07/01/16  Yes Sedalia Muta, FNP  pyridOXINE (VITAMIN B-6) 100 MG tablet Take 100 mg by mouth daily.   Yes Historical Provider, MD  traZODone (DESYREL) 50 MG tablet Take 0.5-1 tablets (25-50 mg total) by mouth at bedtime as needed for sleep. 06/08/16  Yes Sedalia Muta, FNP     Allergies  Allergen Reactions  . Prednisolone Other (See Comments)    Patient can't remember        Objective:  Physical Exam  Constitutional: She appears well-developed and well-nourished.  HENT:  Head: Normocephalic and atraumatic.  Right Ear: External ear normal.  Left Ear: External ear normal.  Eyes: Conjunctivae and EOM are normal. Pupils are equal, round, and reactive to light.  Neck: Normal range of motion.  Cardiovascular: Normal rate, regular rhythm, normal heart sounds and intact distal pulses.   Pulmonary/Chest: Effort normal and breath sounds normal.  Abdominal: Soft.  Musculoskeletal: Normal range of motion.  Neurological: She is alert.  Skin: Skin is warm and dry.  Psychiatric: She has a normal mood and affect. Judgment and thought content normal.  Very anxious and upset when reporting events at work over the last few weeks. Emotional although able to give specific details and communicate appropriately regarding stressful work related events.    Vitals:   07/13/16 1623  BP: (!) 178/94  Pulse: 73  Resp: 18  Temp: 98.3 F (36.8 C)    Assessment & Plan:  1. Anxiety state, work-related stress exacerbated by lack of sleep and inability to experience relaxation due to her constant anxiety related to what is going to occur next at work.   Plan:  Increase  Escitalopram 20 mg daily area trazodone for sleep. Return for follow-up in 2 weeks. She given R advised and that she will be out of work for the next 2 weeks.  2. Elevated blood pressure, feels as though readings are related to emotional stress and lack of exercise and rest. She really doesn't want to go on any blood pressure medications.   Would like to see if resolving work stressors and taking anti-anxiety medication and obtaining more rest will improve readings.  Plan: Recheck blood pressure in two weeks. If readings remain above 150, starting an antihypertensive medication is indicated and appropriate.   [name]. Jaxie Racanelli, MSN, FNP-C [facility]   Total of 25 minutes spent with pt., greater than 50% which was spent in discussion of treatment options for anxiety and methods to control blood pressure.

## 2016-07-16 DIAGNOSIS — R030 Elevated blood-pressure reading, without diagnosis of hypertension: Secondary | ICD-10-CM

## 2016-07-16 DIAGNOSIS — IMO0001 Reserved for inherently not codable concepts without codable children: Secondary | ICD-10-CM | POA: Insufficient documentation

## 2016-07-19 ENCOUNTER — Telehealth: Payer: Self-pay

## 2016-07-19 NOTE — Telephone Encounter (Signed)
Patient needs FMLA forms to be completed by Molli Barrows, I have completed the forms based off her last OV notes about her depression and anxiety. I highlighted the areas that needs to be completed, I will place the forms in your box on 07/19/16 if you could please return them to the FMLA/Disability box at the 102 checkout desk within 5-7 business days. Thank you!

## 2016-07-22 NOTE — Telephone Encounter (Signed)
Paperwork scanned and faxed on 07/22/16

## 2016-07-27 ENCOUNTER — Ambulatory Visit (INDEPENDENT_AMBULATORY_CARE_PROVIDER_SITE_OTHER): Payer: BC Managed Care – PPO | Admitting: Family Medicine

## 2016-07-27 ENCOUNTER — Encounter: Payer: Self-pay | Admitting: Family Medicine

## 2016-07-27 VITALS — BP 168/82 | HR 60 | Temp 98.4°F | Resp 18 | Ht 62.54 in | Wt 207.4 lb

## 2016-07-27 DIAGNOSIS — I10 Essential (primary) hypertension: Secondary | ICD-10-CM | POA: Diagnosis not present

## 2016-07-27 DIAGNOSIS — B009 Herpesviral infection, unspecified: Secondary | ICD-10-CM | POA: Diagnosis not present

## 2016-07-27 DIAGNOSIS — F419 Anxiety disorder, unspecified: Secondary | ICD-10-CM

## 2016-07-27 MED ORDER — AMLODIPINE BESYLATE 2.5 MG PO TABS: 2.5000 mg | ORAL_TABLET | Freq: Every day | ORAL | 3 refills | Status: DC

## 2016-07-27 NOTE — Progress Notes (Addendum)
Patient ID: Rebecca [name], female    DOB: 10/27/1956, 60 y.o.   MRN: [id number]  PCP: Harrison Mons, PA-C  Chief Complaint  Patient presents with  . Follow-up    for mental distress    Subjective:   HPI 60 year-old presents for evaluation of stress related to work.  Patient reports that she has started consistently sleep, decreased concentration. She hasn't received her follow-up phone call regarding her referral to behavioral health for counseling.  She is consistently taking her escitalopram 20 mg as prescribed and trazodone for sleep.  She reports that the escitalopram is making her drowsy, so she has not needed to take trazodone last couple of nights.  She reports feeling the medication is helping with her anxiety. Reports some headaches and decreased appetite.  She reports recent stress of her positive HSV 1&2 infection as her former partner was tested and he was negative.  She is really distressed regarding how and from whom she acquired the infection from. Understands that it may have happened years ago, but she is concerned that this is a "label" on her for life.  Social History   Social History  . Marital status: Single    Spouse name: N/A  . Number of children: 0  . Years of education: 18.5   Occupational History  . Retired [employer]    [employer] and [employer] Services x 30 years  . 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 Smoking status: Never Smoker  . Smokeless tobacco: Never Used  . Alcohol use 2.5 - 3.5 oz/week    5 - 7 drink(s) per week     Comment: 4-5 * week/ 1-2 drinks  . Drug use: No  . Sexual activity: Yes    Partners: Male    Birth control/ protection: Surgical   Other Topics Concern  . Not on file   Social History Narrative   Close friend/significant other killed (GSW) 2012.   Retired after 30 years, and took another job (commutes to [place] 4 days/week).    Family History  Problem  Relation Age of Onset  . Cancer Mother     pancreatic cancer  . Diabetes Mother   . Heart disease Mother     rheumatic heart disease  . Hyperlipidemia Mother   . COPD Sister   . Sleep apnea Sister   . Cancer Sister     Lung Cancer  . Cancer Brother     lung cancer  . Cancer Father     renal, bone  . Diabetes Father   . Hyperlipidemia Father   . Heart attack Maternal Grandfather   . Kidney disease Brother   . Colon cancer Maternal Aunt     Review of Systems  See HPI  Depression screen [facility] 2/9 07/27/2016 07/13/2016 07/01/2016 06/08/2016 06/08/2016  Decreased Interest 1 1 0 0 0  Down, Depressed, Hopeless 0 1 0 0 0  PHQ - 2 Score 1 2 0 0 0   Patient Active Problem List   Diagnosis Date Noted  . Elevated blood pressure 07/16/2016  . Anxiety state 07/03/2016  . Neck pain 07/03/2016  . 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 Acid reflux disease 11/27/2014  . Special screening for malignant neoplasms, colon 09/17/2013  . Obesity, unspecified 01/16/2013  . Spinal stenosis in cervical region 01/11/2013  . Multiple thyroid nodules 01/11/2013  . Hand pain 06/16/2012  . Hyperlipidemia 06/16/2012  Prior to Admission medications   Medication Sig Start Date End Date Taking? Authorizing Provider  cyclobenzaprine (FLEXERIL) 10 MG tablet Take 1 tablet (10 mg total) by mouth 3 (three) times daily as needed for muscle spasms. 07/01/16  Yes Sedalia Muta, FNP  escitalopram (LEXAPRO) 10 MG tablet Take 2 tablets (20 mg total) by mouth at bedtime. 07/13/16  Yes Sedalia Muta, FNP  pyridOXINE (VITAMIN B-6) 100 MG tablet Take 100 mg by mouth daily.   Yes Historical Provider, MD  traZODone (DESYREL) 50 MG tablet Take 0.5-1 tablets (25-50 mg total) by mouth at bedtime as needed for sleep. 06/08/16  Yes Sedalia Muta, FNP     Allergies  Allergen Reactions  . Prednisolone Other (See Comments)    Patient can't remember      Objective:  Physical Exam    Constitutional: She is oriented to person, place, and time. She appears well-developed and well-nourished.  HENT:  Head: Normocephalic and atraumatic.  Right Ear: External ear normal.  Left Ear: External ear normal.  Nose: Nose normal.  Eyes: Conjunctivae and EOM are normal. Pupils are equal, round, and reactive to light.  Neck: Normal range of motion. Neck supple.  Cardiovascular: Normal rate, regular rhythm, normal heart sounds and intact distal pulses.   Pulmonary/Chest: Effort normal and breath sounds normal.  Musculoskeletal: Normal range of motion.  Neurological: She is alert and oriented to person, place, and time.  Skin: Skin is warm and dry.  Psychiatric: She has a normal mood and affect. Her behavior is normal. Judgment and thought content normal.        Vitals:   07/27/16 1344  BP: (!) 168/82  Pulse: 60  Resp: 18  Temp: 98.4 F (36.9 C)   Assessment & Plan:  1. Anxiety, uncontrolled but stable.  Patient is still experiencing significant stressors and reports increased sleep, decreased concentration, related to long-term work-related stress.  She is consistently taking SSRI and reports noticing some improvement in overall mood compared to when she was taking the medication inconsistently.  2. Essential hypertension, Uncontrolled. Likely related to patient's acute stress. Readings have been consistently elevated during prior three visits. Plan: Start amlodipine 2.5 mg daily. Will consider increase to 5 mg if blood pressure is elevated during follow-up visit.  3.HSV 1&2, anticipatory guidance provided referencing the current CDC guidelines. Encouraged barrier protection and to immediately return for care if she develops an oral or genital lesion.  Return for blood pressure and anxiety reevaluation 08/17/16.  [name]. [name], MSN, FNP-C [facility]

## 2016-07-27 NOTE — Patient Instructions (Addendum)
Start amlodipine 2.5 mg for blood pressure. Check blood pressure at minimally once per week and keep a record of readings and bring with you to your follow-up visit. Your referral to behavorial health has been sent and approve to [email or web address]  counseling.  Please contact them to schedule your appointment.    IF you received an x-ray today, you will receive an invoice from [facility]. Please contact [facility] at [phone] with questions or concerns regarding your invoice.   IF you received labwork today, you will receive an invoice from [employer]. Please contact [facility] at [phone] with questions or concerns regarding your invoice.   Our billing staff will not be able to assist you with questions regarding bills from these companies.  You will be contacted with the lab results as soon as they are available. The fastest way to get your results is to activate your My Chart account. Instructions are located on the last page of this paperwork. If you have not heard from [place] regarding the results in 2 weeks, please contact this office.

## 2016-08-16 ENCOUNTER — Ambulatory Visit (INDEPENDENT_AMBULATORY_CARE_PROVIDER_SITE_OTHER): Payer: BC Managed Care – PPO | Admitting: Family Medicine

## 2016-08-16 VITALS — BP 132/78 | HR 88 | Temp 98.6°F | Resp 16 | Ht 63.0 in | Wt 212.0 lb

## 2016-08-16 DIAGNOSIS — I10 Essential (primary) hypertension: Secondary | ICD-10-CM | POA: Diagnosis not present

## 2016-08-16 DIAGNOSIS — F419 Anxiety disorder, unspecified: Secondary | ICD-10-CM | POA: Diagnosis not present

## 2016-08-16 DIAGNOSIS — R079 Chest pain, unspecified: Secondary | ICD-10-CM

## 2016-08-16 NOTE — Patient Instructions (Addendum)
Return for hypertension follow-up in 6 months.  Blood pressure looks really good today! Continue to increase regular physical activity.  If chest pain occurs again, please return for evaluation or report to Emergency Department.   Will consider a referral to cardiology.   IF you received an x-ray today, you will receive an invoice from [facility]. Please contact [facility] at [phone] with questions or concerns regarding your invoice.   IF you received labwork today, you will receive an invoice from [employer]. Please contact [facility] at [phone] with questions or concerns regarding your invoice.   Our billing staff will not be able to assist you with questions regarding bills from these companies.  You will be contacted with the lab results as soon as they are available. The fastest way to get your results is to activate your My Chart account. Instructions are located on the last page of this paperwork. If you have not heard from [place] regarding the results in 2 weeks, please contact this office.    Angina Pectoris Angina pectoris is a very bad feeling in the chest, neck, or arm. Your doctor may call it angina. There are four types of angina. Angina is caused by a lack of blood in the middle and thickest layer of the heart wall (myocardium). Angina may feel like a crushing or squeezing pain in the chest. It may feel like tightness or heavy pressure in the chest. Some people say it feels like gas, heartburn, or indigestion. Some people have symptoms other than pain. These include:  Shortness of breath.  Cold sweats.  Feeling sick to your stomach (nausea).  Feeling light-headed. Many women have chest discomfort and some of the other symptoms. However, women often have different symptoms, such as:  Feeling tired (fatigue).  Feeling nervous for no reason.  Feeling weak for no reason.  Dizziness or fainting. Women may have angina  without any symptoms. HOME CARE  Take medicines only as told by your doctor.  Take care of other health issues as told by your doctor. These include:  High blood pressure (hypertension).  Diabetes.  Follow a heart-healthy diet. Your doctor can help you to choose healthy food options and make changes.  Talk to your doctor to learn more about healthy cooking methods and use them. These include:  Roasting.  Grilling.  Broiling.  Baking.  Poaching.  Steaming.  Stir-frying.  Follow an exercise program approved by your doctor.  Keep a healthy weight. Lose weight as told by your doctor.  Rest when you are tired.  Learn to manage stress.  Do not use any tobacco, such as cigarettes, chewing tobacco, or electronic cigarettes. If you need help quitting, ask your doctor.  If you drink alcohol, and your doctor says it is okay, limit yourself to no more than 1 drink per day. One drink equals 12 ounces of beer, 5 ounces of wine, or 1 ounces of hard liquor.  Stop illegal drug use.  Keep all follow-up visits as told by your doctor. This is important. Do not take these medicines unless your doctor says that you can:  Nonsteroidal anti-inflammatory drugs (NSAIDs). These include:  Ibuprofen.  Naproxen.  Celecoxib.  Vitamin supplements that have vitamin A, vitamin E, or both.  Hormone therapy that contains estrogen with or without progestin. GET HELP RIGHT AWAY IF:  You have pain in your chest, neck, arm, jaw, stomach, or back that:  Lasts more than a few minutes.  Comes back.  Does  not get better after you take medicine under your tongue (sublingual nitroglycerin).  You have any of these symptoms for no reason:  Gas, heartburn, or indigestion.  Sweating a lot.  Shortness of breath or trouble breathing.  Feeling sick to your stomach or throwing up.  Feeling more tired than usual.  Feeling nervous or worrying more than usual.  Feeling  weak.  Diarrhea.  You are suddenly dizzy or light-headed.  You faint or pass out. These symptoms may be an emergency. Do not wait to see if the symptoms will go away. Get medical help right away. Call your local emergency services (911 in the [place] Do not drive yourself to the hospital.   This information is not intended to replace advice given to you by your health care provider. Make sure you discuss any questions you have with your health care provider.   Document Released: 04/12/2008 Document Revised: 03/11/2015 Document Reviewed: 02/26/2014 Elsevier Interactive Patient Education [employer].

## 2016-08-16 NOTE — Progress Notes (Signed)
Patient ID: Rebecca [name], female    DOB: Dec 18, 1955, 60 y.o.   MRN: [id number]  PCP: Harrison Mons, PA-C  Chief Complaint  Patient presents with  . 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 She has been out on medical leave since 07/13/16 for anxiety. She was started on antihypertensive medication 07/27/18 due to elevated blood pressures. She presents today reporting significant improvement of stress and that she is ready to return to work .  Anxiety  She reports significant improvement of symptoms since her last visit her at [facility]. She reports about 1.5 week ago she started working out more and had enough energy to complete projects around the house that she was to complete due to lack of focus and fatigue. She continues to take escitalopram 20 mg daily and denies any adverse side effects of medication.  She reports neck pain has for most part subsided since her anxiety improved.  Chest pain  Patient reports while out shopping 4 days ago, she experienced sorenss of her left chest wall. The pain aching pain, lasted about one minutes and resolved. She continued to shop and the pain reoccurred again. She returned home and pain resolved and subsequently reoccurred the next day. Denies any pain within the last 2 days. Denies shortness of breath, dizziness, and she hasn't recently monitored her blood pressure. Since this episode she has exercised without any reoccurrence of chest pain.  Hypertension Reports not monitoring pressures at home. Takes medication but admits to missing a few doses. Denies any lower extremity swelling since starting medication. Denies headaches or dizziness.  Social History   Social History  . Marital status: Single    Spouse name: N/A  . Number of children: 0  . Years of education: 18.5   Occupational History  . 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 Smoking status: Never Smoker  . Smokeless tobacco: Never Used  . Alcohol use 2.5 - 3.5 oz/week    5 - 7 drink(s) per week     Comment: 4-5 * week/ 1-2 drinks  . Drug use: No  . Sexual activity: Yes    Partners: Male    Birth control/ protection: Surgical   Other Topics Concern  . Not on file   Social History Narrative   Close friend/significant other killed (GSW) 2012.   Retired after 30 years, and took another job (commutes to [place] 4 days/week).   Family History  Problem Relation Age of Onset  . Cancer Mother     pancreatic cancer  . Diabetes Mother   . Heart disease Mother     rheumatic heart disease  . Hyperlipidemia Mother   . COPD Sister   . Sleep apnea Sister   . Cancer Sister     Lung Cancer  . Cancer Brother     lung cancer  . Cancer Father     renal, bone  . Diabetes Father   . Hyperlipidemia Father   . Heart attack Maternal Grandfather   . Kidney disease Brother   . Colon cancer Maternal Aunt     Review of Systems See HPI   Patient Active Problem List   Diagnosis Date Noted  . Elevated blood pressure 07/16/2016  .  Anxiety state 07/03/2016  . Neck pain 07/03/2016  . 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 Acid reflux disease 11/27/2014  . Special screening for malignant neoplasms, colon 09/17/2013  . Obesity, unspecified 01/16/2013  . Spinal stenosis in cervical region 01/11/2013  . Multiple thyroid nodules 01/11/2013  . Hand pain 06/16/2012  . Hyperlipidemia 06/16/2012     Prior to Admission medications   Medication Sig Start Date End Date Taking? Authorizing Provider  amLODipine (NORVASC) 2.5 MG tablet Take 1 tablet (2.5 mg total) by mouth daily. 07/27/16  Yes Sedalia Muta, FNP  cyclobenzaprine (FLEXERIL) 10 MG tablet Take 1 tablet (10 mg total) by mouth 3 (three) times daily as needed for muscle  spasms. 07/01/16  Yes Sedalia Muta, FNP  escitalopram (LEXAPRO) 10 MG tablet Take 2 tablets (20 mg total) by mouth at bedtime. 07/13/16  Yes Sedalia Muta, FNP  pyridOXINE (VITAMIN B-6) 100 MG tablet Take 100 mg by mouth daily.   Yes Historical Provider, MD  traZODone (DESYREL) 50 MG tablet Take 0.5-1 tablets (25-50 mg total) by mouth at bedtime as needed for sleep. 06/08/16  Yes Sedalia Muta, FNP     Allergies  Allergen Reactions  . Prednisolone Other (See Comments)    Patient can't remember        Objective:  Physical Exam  Constitutional: She is oriented to person, place, and time. She appears well-developed and well-nourished.  HENT:  Head: Normocephalic and atraumatic.  Right Ear: External ear normal.  Left Ear: External ear normal.  Nose: Nose normal.  Eyes: Conjunctivae and EOM are normal. Pupils are equal, round, and reactive to light.  Neck: Normal range of motion. Neck supple.  Cardiovascular: Normal rate, regular rhythm, normal heart sounds and intact distal pulses.   Precepted with Dr. Carlota Raspberry EKG: negative for ST elevation or depression No ischemic changes NSR   Pulmonary/Chest: Effort normal and breath sounds normal.  Musculoskeletal: Normal range of motion.  Neurological: She is alert and oriented to person, place, and time.  Skin: Skin is warm and dry.  Psychiatric: She has a normal mood and affect. Her behavior is normal. Judgment and thought content normal.     Vitals:   08/16/16 1536  BP: 132/78  Pulse: 88  Resp: 16  Temp: 98.6 F (37 C)   Assessment & Plan:  1. Anxiety, stable. Patient may return to work without restriction.  Plan: Continue Escitalopram 20 mg daily at bedtime.   2. Chest pain, unspecified type, EKG was negative of any acute ischemic changes. Rhythm is Normal Sinus. Patient has remained chest pain free 48 hours after the original episode and has exercised vigorously without symptoms reoccurring.  The episodes of chest pain were localized and non-radiating, lasting less than 1 minute. If reoccurs will consider a cardiology referral for consideration of an stress test. - EKG 12-Lead  Plan: Return for care if another episode of chest pain occurs.  3. Essential hypertension, Controlled  Plan: Continue Amlodipine 2.5 mg daily as needed for blood pressure control.  Follow-up as needed.  [name]. [name], MSN, FNP-C [facility]

## 2016-11-19 DIAGNOSIS — E538 Deficiency of other specified B group vitamins: Secondary | ICD-10-CM | POA: Diagnosis not present

## 2016-11-19 DIAGNOSIS — E782 Mixed hyperlipidemia: Secondary | ICD-10-CM | POA: Diagnosis not present

## 2016-11-19 DIAGNOSIS — R7303 Prediabetes: Secondary | ICD-10-CM | POA: Diagnosis not present

## 2016-12-03 DIAGNOSIS — R7303 Prediabetes: Secondary | ICD-10-CM | POA: Diagnosis not present

## 2016-12-03 DIAGNOSIS — E538 Deficiency of other specified B group vitamins: Secondary | ICD-10-CM | POA: Diagnosis not present

## 2016-12-03 DIAGNOSIS — E782 Mixed hyperlipidemia: Secondary | ICD-10-CM | POA: Diagnosis not present

## 2016-12-17 DIAGNOSIS — E782 Mixed hyperlipidemia: Secondary | ICD-10-CM | POA: Diagnosis not present

## 2016-12-17 DIAGNOSIS — R7303 Prediabetes: Secondary | ICD-10-CM | POA: Diagnosis not present

## 2016-12-17 DIAGNOSIS — E538 Deficiency of other specified B group vitamins: Secondary | ICD-10-CM | POA: Diagnosis not present

## 2016-12-29 DIAGNOSIS — R7303 Prediabetes: Secondary | ICD-10-CM | POA: Diagnosis not present

## 2016-12-29 DIAGNOSIS — E782 Mixed hyperlipidemia: Secondary | ICD-10-CM | POA: Diagnosis not present

## 2016-12-29 DIAGNOSIS — E538 Deficiency of other specified B group vitamins: Secondary | ICD-10-CM | POA: Diagnosis not present

## 2017-01-26 ENCOUNTER — Ambulatory Visit (INDEPENDENT_AMBULATORY_CARE_PROVIDER_SITE_OTHER): Payer: BC Managed Care – PPO | Admitting: Physician Assistant

## 2017-01-26 VITALS — BP 140/88 | HR 68 | Temp 98.9°F | Resp 17 | Ht 63.0 in | Wt 212.0 lb

## 2017-01-26 DIAGNOSIS — J029 Acute pharyngitis, unspecified: Secondary | ICD-10-CM

## 2017-01-26 LAB — POCT CBC
Granulocyte percent: 70.2 % (ref 37–80)
HCT, POC: 37.1 % — AB (ref 37.7–47.9)
Hemoglobin: 13.1 g/dL (ref 12.2–16.2)
Lymph, poc: 2.9 (ref 0.6–3.4)
MCH, POC: 31.7 pg — AB (ref 27–31.2)
MCHC: 35.2 g/dL (ref 31.8–35.4)
MCV: 90 fL (ref 80–97)
MID (cbc): 0.5 (ref 0–0.9)
MPV: 9.1 fL (ref 0–99.8)
POC Granulocyte: 8 — AB (ref 2–6.9)
POC LYMPH PERCENT: 25.3 % (ref 10–50)
POC MID %: 4.5 % (ref 0–12)
Platelet Count, POC: 230 10*3/uL (ref 142–424)
RBC: 4.13 M/uL (ref 4.04–5.48)
RDW, POC: 13.2 %
WBC: 11.4 10*3/uL — AB (ref 4.6–10.2)

## 2017-01-26 LAB — POCT RAPID STREP A (OFFICE): Rapid Strep A Screen: NEGATIVE

## 2017-01-26 MED ORDER — CLARITHROMYCIN ER 500 MG PO TB24: 1000.0000 mg | ORAL_TABLET | Freq: Every day | ORAL | 0 refills | Status: DC

## 2017-01-26 NOTE — Patient Instructions (Addendum)
Ibuprofen 400-'600mg'$  every 6 hours for pain.See below for home care tips.  Take ENTIRE COURSE of antibiotic, even if you start to feel better. Stay well hydrated - drink at least 64 oz of water/clear fluids a day.  Please come back if you are not better after your treatment.   Thank you for coming in today. I hope you feel we met your needs.  Feel free to call [facility] if you have any questions or further requests.  Please consider signing up for MyChart if you do not already have it, as this is a great way to communicate with me.  Best,  Whitney McVey, PA-C   Pharyngitis Pharyngitis is redness, pain, and swelling (inflammation) of your pharynx. What are the causes? Pharyngitis is usually caused by infection. Most of the time, these infections are from viruses (viral) and are part of a cold. However, sometimes pharyngitis is caused by bacteria (bacterial). Pharyngitis can also be caused by allergies. Viral pharyngitis may be spread from person to person by coughing, sneezing, and personal items or utensils (cups, forks, spoons, toothbrushes). Bacterial pharyngitis may be spread from person to person by more intimate contact, such as kissing. What are the signs or symptoms? Symptoms of pharyngitis include:  Sore throat.  Tiredness (fatigue).  Low-grade fever.  Headache.  Joint pain and muscle aches.  Skin rashes.  Swollen lymph nodes.  Plaque-like film on throat or tonsils (often seen with bacterial pharyngitis). How is this diagnosed? Your health care provider will ask you questions about your illness and your symptoms. Your medical history, along with a physical exam, is often all that is needed to diagnose pharyngitis. Sometimes, a rapid strep test is done. Other lab tests may also be done, depending on the suspected cause. How is this treated? Viral pharyngitis will usually get better in 3-4 days without the use of medicine. Bacterial pharyngitis is treated with medicines that kill  germs (antibiotics). Follow these instructions at home:  Drink enough water and fluids to keep your urine clear or pale yellow.  Only take over-the-counter or prescription medicines as directed by your health care provider:  If you are prescribed antibiotics, make sure you finish them even if you start to feel better.  Do not take aspirin.  Get lots of rest.  Gargle with 8 oz of salt water ( tsp of salt per 1 qt of water) as often as every 1-2 hours to soothe your throat.  Throat lozenges (if you are not at risk for choking) or sprays may be used to soothe your throat. Contact a health care provider if:  You have large, tender lumps in your neck.  You have a rash.  You cough up green, yellow-brown, or bloody spit. Get help right away if:  Your neck becomes stiff.  You drool or are unable to swallow liquids.  You vomit or are unable to keep medicines or liquids down.  You have severe pain that does not go away with the use of recommended medicines.  You have trouble breathing (not caused by a stuffy nose). This information is not intended to replace advice given to you by your health care provider. Make sure you discuss any questions you have with your health care provider. Document Released: 10/25/2005 Document Revised: 04/01/2016 Document Reviewed: 07/02/2013 Elsevier Interactive Patient Education  2017 [employer].   IF you received an x-ray today, you will receive an invoice from [facility]. Please contact [facility] at [phone] with questions or concerns regarding your  invoice.   IF you received labwork today, you will receive an invoice from [place]. Please contact [facility] at [phone] with questions or concerns regarding your invoice.   Our billing staff will not be able to assist you with questions regarding bills from these companies.  You will be contacted with the lab results as soon as they are available. The fastest way to get  your results is to activate your My Chart account. Instructions are located on the last page of this paperwork. If you have not heard from [place] regarding the results in 2 weeks, please contact this office.

## 2017-01-26 NOTE — Progress Notes (Signed)
Rebecca [name]  MRN: [id number] DOB: 06-Sep-1956  PCP: Harrison Mons, PA-C  Subjective:  Pt is a 61 year old female PMH HLD, anxiety, obesity, acid reflux disease who presents to clinic for sore throat and fever.  Sore throat started 3 days ago. She is a little better today, but throat is still painful. Endorses feeling feverish, watery eyes, chills, body aches.  Hard to swallow. She ate rice and chicken last night. She has been drinking hot tea and cough drops.  Possible sick contacts at a social event this weekend.  She has been working a lot last week - she feels run down.   Review of Systems  Constitutional: Positive for chills and fever. Negative for diaphoresis and fatigue.  HENT: Positive for sore throat. Negative for congestion, postnasal drip, rhinorrhea, sinus pressure and sneezing.   Respiratory: Negative for cough, chest tightness, shortness of breath and wheezing.   Cardiovascular: Negative for chest pain and palpitations.  Gastrointestinal: Negative for abdominal pain, diarrhea, nausea and vomiting.  Neurological: Negative for weakness, light-headedness and headaches.  Psychiatric/Behavioral: Positive for sleep disturbance.    Patient Active Problem List   Diagnosis Date Noted  . Elevated blood pressure 07/16/2016  . Anxiety state 07/03/2016  . Neck pain 07/03/2016  . 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 Acid reflux disease 11/27/2014  . Special screening for malignant neoplasms, colon 09/17/2013  . Obesity, unspecified 01/16/2013  . Spinal stenosis in cervical region 01/11/2013  . Multiple thyroid nodules 01/11/2013  . Hand pain 06/16/2012  . Hyperlipidemia 06/16/2012    Current Outpatient Prescriptions on File Prior to Visit  Medication Sig Dispense Refill  . cyclobenzaprine (FLEXERIL) 10 MG tablet Take 1 tablet (10 mg total) by mouth 3 (three) times daily as needed for muscle spasms. 30 tablet 0  . escitalopram (LEXAPRO) 10 MG tablet Take 2 tablets (20 mg total)  by mouth at bedtime. 60 tablet 1  . pyridOXINE (VITAMIN B-6) 100 MG tablet Take 100 mg by mouth daily.    . traZODone (DESYREL) 50 MG tablet Take 0.5-1 tablets (25-50 mg total) by mouth at bedtime as needed for sleep. 30 tablet 3  . amLODipine (NORVASC) 2.5 MG tablet Take 1 tablet (2.5 mg total) by mouth daily. (Patient not taking: Reported on 01/26/2017) 90 tablet 3   No current facility-administered medications on file prior to visit.     Allergies  Allergen Reactions  . Prednisolone Other (See Comments)    Patient can't remember      Objective:  BP 140/88 (BP Location: Right Arm, Patient Position: Sitting, Cuff Size: Large)   Pulse 68   Temp 98.9 F (37.2 C) (Oral)   Resp 17   Ht 5\' 3"  (1.6 m)   Wt 212 lb (96.2 kg)   SpO2 100%   BMI 37.55 kg/m   Physical Exam  Constitutional: She is oriented to person, place, and time and well-developed, well-nourished, and in no distress. No distress.  HENT:  Right Ear: Tympanic membrane normal.  Left Ear: Tympanic membrane normal.  Mouth/Throat: Uvula is midline and mucous membranes are normal. Posterior oropharyngeal edema and posterior oropharyngeal erythema present. No oropharyngeal exudate.  Cardiovascular: Normal rate, regular rhythm and normal heart sounds.   Neurological: She is alert and oriented to person, place, and time. GCS score is 15.  Skin: Skin is warm and dry.  Psychiatric: Mood, memory, affect and judgment normal.  Vitals reviewed.  Results for orders placed or performed in visit on 01/26/17  POCT rapid strep  A  Result Value Ref Range   Rapid Strep A Screen Negative Negative  POCT CBC  Result Value Ref Range   WBC 11.4 (A) 4.6 - 10.2 K/uL   Lymph, poc 2.9 0.6 - 3.4   POC LYMPH PERCENT 25.3 10 - 50 %L   MID (cbc) 0.5 0 - 0.9   POC MID % 4.5 0 - 12 %M   POC Granulocyte 8.0 (A) 2 - 6.9   Granulocyte percent 70.2 37 - 80 %G   RBC 4.13 4.04 - 5.48 M/uL   Hemoglobin 13.1 12.2 - 16.2 g/dL   HCT, POC 37.1 (A) 37.7 -  47.9 %   MCV 90.0 80 - 97 fL   MCH, POC 31.7 (A) 27 - 31.2 pg   MCHC 35.2 31.8 - 35.4 g/dL   RDW, POC 13.2 %   Platelet Count, POC 230 142 - 424 K/uL   MPV 9.1 0 - 99.8 fL    Assessment and Plan :  1. Acute pharyngitis, unspecified etiology 2. Sore throat - clarithromycin (BIAXIN XL) 500 MG 24 hr tablet; Take 2 tablets (1,000 mg total) by mouth daily.  Dispense: 20 tablet; Refill: 0 - POCT rapid strep A - Culture, Group A Strep - POCT CBC - Leukocytosis with left shift suggests bacterial pharyngitis, will treat as such. Supportive care encouraged: push fluids, rest, ibuprofen for pain. RTC in 5-7 days if no improvement.   Mercer Pod, PA-C  [facility] 01/26/2017 10:53 AM

## 2017-01-28 LAB — CULTURE, GROUP A STREP

## 2017-02-02 ENCOUNTER — Ambulatory Visit (INDEPENDENT_AMBULATORY_CARE_PROVIDER_SITE_OTHER): Payer: BC Managed Care – PPO | Admitting: Physician Assistant

## 2017-02-02 VITALS — BP 142/78 | HR 77 | Temp 99.1°F | Resp 18 | Ht 63.0 in | Wt 210.8 lb

## 2017-02-02 DIAGNOSIS — J020 Streptococcal pharyngitis: Secondary | ICD-10-CM | POA: Diagnosis not present

## 2017-02-02 DIAGNOSIS — J029 Acute pharyngitis, unspecified: Secondary | ICD-10-CM

## 2017-02-02 DIAGNOSIS — R030 Elevated blood-pressure reading, without diagnosis of hypertension: Secondary | ICD-10-CM | POA: Diagnosis not present

## 2017-02-02 LAB — POCT CBC
GRANULOCYTE PERCENT: 71.2 % (ref 37–80)
HCT, POC: 37.6 % — AB (ref 37.7–47.9)
HEMOGLOBIN: 12.9 g/dL (ref 12.2–16.2)
LYMPH, POC: 3.2 (ref 0.6–3.4)
MCH, POC: 31 pg (ref 27–31.2)
MCHC: 34.4 g/dL (ref 31.8–35.4)
MCV: 90.2 fL (ref 80–97)
MID (cbc): 0.4 (ref 0–0.9)
MPV: 8.5 fL (ref 0–99.8)
PLATELET COUNT, POC: 277 10*3/uL (ref 142–424)
POC Granulocyte: 9 — AB (ref 2–6.9)
POC LYMPH %: 25.3 % (ref 10–50)
POC MID %: 3.5 %M (ref 0–12)
RBC: 4.17 M/uL (ref 4.04–5.48)
RDW, POC: 13.3 %
WBC: 12.7 10*3/uL — AB (ref 4.6–10.2)

## 2017-02-02 MED ORDER — MAGIC MOUTHWASH W/LIDOCAINE: 10.0000 mL | ORAL | 0 refills | Status: DC | PRN

## 2017-02-02 MED ORDER — AMOXICILLIN-POT CLAVULANATE 875-125 MG PO TABS: 1.0000 | ORAL_TABLET | Freq: Two times a day (BID) | ORAL | 0 refills | Status: AC

## 2017-02-02 NOTE — Patient Instructions (Addendum)
It appears that your infection is worsening, so we need to change your antibiotic. I would like you to start taking augmentin today. You can stop the clarithormycin. I would like you to follow up tomorrow with me later in the day for reevaluation. If overnight you start to have any worsening pain, difficulty swallowing, fever, or change in your voice, I want you to go immediately to the ED for evaluation.   Sore Throat When you have a sore throat, your throat may:  Hurt.  Burn.  Feel irritated.  Feel scratchy. Many things can cause a sore throat, including:  An infection.  Allergies.  Dryness in the air.  Smoke or pollution.  Gastroesophageal reflux disease (GERD).  A tumor. A sore throat can be the first sign of another sickness. It can happen with other problems, like coughing or a fever. Most sore throats go away without treatment. Follow these instructions at home:  Take over-the-counter medicines only as told by your doctor.  Drink enough fluids to keep your pee (urine) clear or pale yellow.  Rest when you feel you need to.  To help with pain, try:  Sipping warm liquids, such as broth, herbal tea, or warm water.  Eating or drinking cold or frozen liquids, such as frozen ice pops.  Gargling with a salt-water mixture 3-4 times a day or as needed. To make a salt-water mixture, add -1 tsp of salt in 1 cup of warm water. Mix it until you cannot see the salt anymore.  Sucking on hard candy or throat lozenges.  Putting a cool-mist humidifier in your bedroom at night.  Sitting in the bathroom with the door closed for 5-10 minutes while you run hot water in the shower.  Do not use any tobacco products, such as cigarettes, chewing tobacco, and e-cigarettes. If you need help quitting, ask your doctor. Contact a doctor if:  You have a fever for more than 2-3 days.  You keep having symptoms for more than 2-3 days.  Your throat does not get better in 7 days.  You have  a fever and your symptoms suddenly get worse. Get help right away if:  You have trouble breathing.  You cannot swallow fluids, soft foods, or your saliva.  You have swelling in your throat or neck that gets worse.  You keep feeling like you are going to throw up (vomit).  You keep throwing up. This information is not intended to replace advice given to you by your health care provider. Make sure you discuss any questions you have with your health care provider. Document Released: 08/03/2008 Document Revised: 06/20/2016 Document Reviewed: 08/15/2015 Elsevier Interactive Patient Education  2017 [employer].     IF you received an x-ray today, you will receive an invoice from [facility]. Please contact [facility] at [phone] with questions or concerns regarding your invoice.   IF you received labwork today, you will receive an invoice from [place]. Please contact [facility] at [phone] with questions or concerns regarding your invoice.   Our billing staff will not be able to assist you with questions regarding bills from these companies.  You will be contacted with the lab results as soon as they are available. The fastest way to get your results is to activate your My Chart account. Instructions are located on the last page of this paperwork. If you have not heard from [place] regarding the results in 2 weeks, please contact this office.

## 2017-02-02 NOTE — Progress Notes (Signed)
MRN: [id number] DOB: 1955/12/12  Subjective:   Rebecca [name] is a 61 y.o. female presenting for chief complaint of Sore Throat (x a week ) .  Reports 10 day history of sore throat, pain with swallowing, and subjective fever. Initially seen on 01/26/17 for the same issue. WBC was 11.4 and strep was negative but culture positive for beta hemolytic strep, not group A. Given rx for clarithromycin. Has tried pain med with no relief. Was feeling better after taking the antibiotics for two days but notes 3 days ago it came back and now states the pain is worse than when she was here one week ago. Denies difficulty swallowing fluids/food, voice change, sinus pain, rhinorrhea, ear pain, myalgia, cough, chills, nausea, vomiting, abdominal pain and diarrhea. Has had sick contact with colleagues. Has no history of seasonal allergies,  nohistory of asthma. Patient has not had flu shot this season. Denies smoking. Denies any other aggravating or relieving factors, no other questions or concerns.   Pavneet has a current medication list which includes the following prescription(s): clarithromycin, cyclobenzaprine, escitalopram, pyridoxine, trazodone, amlodipine, and amoxicillin-clavulanate. Also is allergic to prednisolone.  Tela  has a past medical history of Hyperlipidemia; Obesity; and Thyroid disease. Also  has a past surgical history that includes Abdominal hysterectomy.   Objective:   Vitals: BP (!) 142/78 (BP Location: Right Arm, Patient Position: Sitting, Cuff Size: Large)   Pulse 77   Temp 99.1 F (37.3 C) (Oral)   Resp 18   Ht 5\' 3"  (1.6 m)   Wt 210 lb 12.8 oz (95.6 kg)   SpO2 100%   BMI 37.34 kg/m   Physical Exam  Constitutional: She is oriented to person, place, and time. She appears well-developed and well-nourished. No distress.  HENT:  Head: Normocephalic and atraumatic.  Mouth/Throat: Uvula swelling (with minimal deviation to the left) present. Posterior oropharyngeal edema  (more promient on the right side) and posterior oropharyngeal erythema (more prominent on the right) present. Tonsils are 2+ on the right. Tonsils are 2+ on the left. Tonsillar exudate (most notable on right side).  No evidence of apparent tonsillar abscess. No hoarse voice. Speaking in full sentences.    Eyes: Conjunctivae are normal.  Neck: Normal range of motion.  Tenderness with deep palpation of submandibular and anterior chain portion of neck on right side.   Cardiovascular: Normal rate, regular rhythm and normal heart sounds.   Pulmonary/Chest: Effort normal and breath sounds normal.  Lymphadenopathy:       Head (right side): No submental, no submandibular, no tonsillar, no preauricular, no posterior auricular and no occipital adenopathy present.       Head (left side): No submental, no submandibular, no tonsillar, no preauricular, no posterior auricular and no occipital adenopathy present.    She has no cervical adenopathy.       Right: No supraclavicular adenopathy present.       Left: No supraclavicular adenopathy present.  Neurological: She is alert and oriented to person, place, and time.  Skin: Skin is warm and dry.  Psychiatric: She has a normal mood and affect.  Vitals reviewed.   BP Readings from Last 3 Encounters:  02/02/17 (!) 142/78  01/26/17 140/88  08/16/16 132/78   Results for orders placed or performed in visit on 02/02/17 (from the past 24 hour(s))  POCT CBC     Status: Abnormal   Collection Time: 02/02/17 10:57 AM  Result Value Ref Range   WBC 12.7 (A) 4.6 - 10.2 K/uL  Lymph, poc 3.2 0.6 - 3.4   POC LYMPH PERCENT 25.3 10 - 50 %L   MID (cbc) 0.4 0 - 0.9   POC MID % 3.5 0 - 12 %M   POC Granulocyte 9.0 (A) 2 - 6.9   Granulocyte percent 71.2 37 - 80 %G   RBC 4.17 4.04 - 5.48 M/uL   Hemoglobin 12.9 12.2 - 16.2 g/dL   HCT, POC 37.6 (A) 37.7 - 47.9 %   MCV 90.2 80 - 97 fL   MCH, POC 31.0 27 - 31.2 pg   MCHC 34.4 31.8 - 35.4 g/dL   RDW, POC 13.3 %    Platelet Count, POC 277 142 - 424 K/uL   MPV 8.5 0 - 99.8 fL    Assessment and Plan :  This case was precepted with Dr. Mingo Amber, who also examined the patient.  1. Sore throat - POCT CBC 2. Strep throat Infection has clearly not responded to clarithromycin and according to pt, PE findings, and WBC count appears to have worsened since initial visit on 01/26/17. There is concern that patient will develop peritonsillar abscess if prompt treatment is not initiated. There is no apparent abscess on exam today although the right side does appear more inflamed/irritated than the left side. She is not having any difficulty swallowing or voice change at this time, which is reassuring. Pt informed to discontinue clarithromycin and start augmentin immediately. Will plan for close follow up tomorrow evening. Will plan to repeat CBC tomorrow to ensure she is responding to antibiotic. Pt given strict ED precautions that if she has any worsening pain, difficulty swallowing, fever, or change in voice to seek care immediately at the ED. If she is not having any improvement tomorrow during follow up, consider sending to ED for further evaluation.  - amoxicillin-clavulanate (AUGMENTIN) 875-125 MG tablet; Take 1 tablet by mouth 2 (two) times daily.  Dispense: 28 tablet; Refill: 0  3. Elevated blood pressure reading -Asymptomatic in office, likely due to current illness. She does have a Rx for amlodipine 2.5mg  but notes she does not think she has high blood pressure so she does not take it.  Instructed to check bp outside of office over the next few weeks. If consistently >140/90, instructed to return for further evaluation of bp.   Tenna Delaine, PA-C  [facility] 02/02/2017 11:00 AM

## 2017-02-03 ENCOUNTER — Ambulatory Visit (INDEPENDENT_AMBULATORY_CARE_PROVIDER_SITE_OTHER): Payer: BC Managed Care – PPO | Admitting: Physician Assistant

## 2017-02-03 VITALS — BP 132/85 | HR 81 | Temp 99.2°F | Resp 18 | Ht 63.0 in | Wt 209.6 lb

## 2017-02-03 DIAGNOSIS — J36 Peritonsillar abscess: Secondary | ICD-10-CM

## 2017-02-03 DIAGNOSIS — J020 Streptococcal pharyngitis: Secondary | ICD-10-CM | POA: Diagnosis not present

## 2017-02-03 HISTORY — DX: Peritonsillar abscess: J36

## 2017-02-03 LAB — POCT CBC
Granulocyte percent: 73.1 %G (ref 37–80)
HEMATOCRIT: 39.2 % (ref 37.7–47.9)
Hemoglobin: 13.5 g/dL (ref 12.2–16.2)
LYMPH, POC: 3.3 (ref 0.6–3.4)
MCH, POC: 31 pg (ref 27–31.2)
MCHC: 34.6 g/dL (ref 31.8–35.4)
MCV: 89.5 fL (ref 80–97)
MID (cbc): 0.8 (ref 0–0.9)
MPV: 8.8 fL (ref 0–99.8)
PLATELET COUNT, POC: 282 10*3/uL (ref 142–424)
POC GRANULOCYTE: 11 — AB (ref 2–6.9)
POC LYMPH PERCENT: 21.6 %L (ref 10–50)
POC MID %: 5.3 %M (ref 0–12)
RBC: 4.38 M/uL (ref 4.04–5.48)
RDW, POC: 13.4 %
WBC: 15.1 10*3/uL — AB (ref 4.6–10.2)

## 2017-02-03 NOTE — Progress Notes (Signed)
MRN: [id number] DOB: 11-24-1955  Subjective:   Rebecca [name] is a 61 y.o. female presenting for follow up on strep throat with concern for beginning PTA. Initially seen for sore throat on 01/26/17, rapid strep negative, started on clarithromycin, strep culture returned positive for beta hemolytic strep not group A. Returned on 02/02/17 with worsening sore throat despite taking antibiotics and pain medication. Upon examination, her tonsils were swollen, exudative, with erythematous/edemaotous posterior oropharyngeal, worsened on the right. No apparent abscess was noted. Pt was speaking and swallowing without any difficulty. CBC was drawn and her WBC was trending up from initial visit (11.4->12.7). She was instructed to discontinue clarithromycin and start augmentin and return to clinic for close follow up in one day, strict ED precautions given.   Today, she states she is still in a lot of pain. She did not get any sleep last night. She is having pain when she swallows but is still able to tolerate both food and fluids. Denies voice change, fever, and chills. Has taken 3 doses of Augmentin.    Rebecca [name] has a current medication list which includes the following prescription(s): amoxicillin-clavulanate, clarithromycin, cyclobenzaprine, escitalopram, pyridoxine, trazodone, and amlodipine. Also is allergic to prednisolone.  Rebecca [name]  has a past medical history of Hyperlipidemia; Obesity; and Thyroid disease. Also  has a past surgical history that includes Abdominal hysterectomy.   Objective:   Vitals: BP 132/85   Pulse 81   Temp 99.2 F (37.3 C) (Oral)   Resp 18   Ht 5\' 3"  (1.6 m)   Wt 209 lb 9.6 oz (95.1 kg)   SpO2 100%   BMI 37.13 kg/m   Physical Exam  Constitutional: She is oriented to person, place, and time. She appears well-developed and well-nourished. She appears distressed (she is tearful in apparent discomfort sitting on exam table).  HENT:  Head: Normocephalic and atraumatic.    Mouth/Throat: No trismus in the jaw. Uvula swelling (with deviation to the left) present. Posterior oropharyngeal edema (more prominent on the right side) and posterior oropharyngeal erythema (more prominent on the right side) present. Tonsils are 2+ on the right. Tonsils are 2+ on the left. Tonsillar exudate.  Fluctuance palpated in right peritonsillar region.   Pain with deep palpation of anterior chain portion and submandibular portion of the neck. No apparent lymphadenopathy palpated.   Speaking in full sentences, no hoarse voice.   Eyes: Conjunctivae are normal.  Neck: Normal range of motion.  Pulmonary/Chest: Effort normal.  Neurological: She is alert and oriented to person, place, and time.  Skin: Skin is warm and dry.  Psychiatric: She has a normal mood and affect.  Vitals reviewed.   Results for orders placed or performed in visit on 02/03/17 (from the past 24 hour(s))  POCT CBC     Status: Abnormal   Collection Time: 02/03/17  3:33 PM  Result Value Ref Range   WBC 15.1 (A) 4.6 - 10.2 K/uL   Lymph, poc 3.3 0.6 - 3.4   POC LYMPH PERCENT 21.6 10 - 50 %L   MID (cbc) 0.8 0 - 0.9   POC MID % 5.3 0 - 12 %M   POC Granulocyte 11.0 (A) 2 - 6.9   Granulocyte percent 73.1 37 - 80 %G   RBC 4.38 4.04 - 5.48 M/uL   Hemoglobin 13.5 12.2 - 16.2 g/dL   HCT, POC 39.2 37.7 - 47.9 %   MCV 89.5 80 - 97 fL   MCH, POC 31.0 27 - 31.2 pg  MCHC 34.6 31.8 - 35.4 g/dL   RDW, POC 13.4 %   Platelet Count, POC 282 142 - 424 K/uL   MPV 8.8 0 - 99.8 fL    Assessment and Plan :  .1. Peritonsillar abscess -PE findings reveal palpable fluctuance in the right peritonsillar region, which was not apparent during yesterday's PE and her WBC is trending up despite initiation of augmentin yesterday. I have contacted [facility] and Dr. Constance Holster has agreed to see patient immediately in his office for possible I&D. I have given patient the address to [facility] and instructed her to  go there immediately. She agrees to do so.  2. Streptococcal sore throat - POCT CBC  Tenna Delaine, PA-C  [facility] 02/03/2017 3:34 PM \

## 2017-02-03 NOTE — Patient Instructions (Addendum)
Go immediately to:   [facility], [address] [place],  [address] Phone [phone]  Fax [phone] Today's Hours 9 a.m. to 5 p.m.    IF you received an x-ray today, you will receive an invoice from [facility]. Please contact [facility] at [phone] with questions or concerns regarding your invoice.   IF you received labwork today, you will receive an invoice from [facility]. Please contact [facility] at [phone] with questions or concerns regarding your invoice.   Our billing staff will not be able to assist you with questions regarding bills from these companies.  You will be contacted with the lab results as soon as they are available. The fastest way to get your results is to activate your My Chart account. Instructions are located on the last page of this paperwork. If you have not heard from [place] regarding the results in 2 weeks, please contact this office.

## 2017-02-04 DIAGNOSIS — E559 Vitamin D deficiency, unspecified: Secondary | ICD-10-CM | POA: Diagnosis not present

## 2017-02-04 DIAGNOSIS — E782 Mixed hyperlipidemia: Secondary | ICD-10-CM | POA: Diagnosis not present

## 2017-02-04 DIAGNOSIS — E538 Deficiency of other specified B group vitamins: Secondary | ICD-10-CM | POA: Diagnosis not present

## 2017-02-25 DIAGNOSIS — R7303 Prediabetes: Secondary | ICD-10-CM | POA: Diagnosis not present

## 2017-02-25 DIAGNOSIS — E782 Mixed hyperlipidemia: Secondary | ICD-10-CM | POA: Diagnosis not present

## 2017-02-25 DIAGNOSIS — E538 Deficiency of other specified B group vitamins: Secondary | ICD-10-CM | POA: Diagnosis not present

## 2017-03-18 DIAGNOSIS — E782 Mixed hyperlipidemia: Secondary | ICD-10-CM | POA: Diagnosis not present

## 2017-03-18 DIAGNOSIS — R7303 Prediabetes: Secondary | ICD-10-CM | POA: Diagnosis not present

## 2017-03-25 DIAGNOSIS — I10 Essential (primary) hypertension: Secondary | ICD-10-CM | POA: Diagnosis not present

## 2017-03-25 DIAGNOSIS — E782 Mixed hyperlipidemia: Secondary | ICD-10-CM | POA: Diagnosis not present

## 2017-04-15 DIAGNOSIS — E782 Mixed hyperlipidemia: Secondary | ICD-10-CM | POA: Diagnosis not present

## 2017-04-15 DIAGNOSIS — I10 Essential (primary) hypertension: Secondary | ICD-10-CM | POA: Diagnosis not present

## 2017-05-09 ENCOUNTER — Telehealth: Payer: Self-pay | Admitting: Family Medicine

## 2017-05-09 ENCOUNTER — Encounter: Payer: Self-pay | Admitting: Family Medicine

## 2017-05-09 ENCOUNTER — Ambulatory Visit (INDEPENDENT_AMBULATORY_CARE_PROVIDER_SITE_OTHER): Payer: BC Managed Care – PPO | Admitting: Family Medicine

## 2017-05-09 VITALS — BP 142/80 | HR 75 | Temp 98.0°F | Resp 16 | Ht 63.0 in | Wt 219.0 lb

## 2017-05-09 DIAGNOSIS — Z131 Encounter for screening for diabetes mellitus: Secondary | ICD-10-CM | POA: Diagnosis not present

## 2017-05-09 DIAGNOSIS — J029 Acute pharyngitis, unspecified: Secondary | ICD-10-CM | POA: Diagnosis not present

## 2017-05-09 DIAGNOSIS — I10 Essential (primary) hypertension: Secondary | ICD-10-CM

## 2017-05-09 DIAGNOSIS — R490 Dysphonia: Secondary | ICD-10-CM | POA: Diagnosis not present

## 2017-05-09 DIAGNOSIS — R768 Other specified abnormal immunological findings in serum: Secondary | ICD-10-CM

## 2017-05-09 LAB — POCT URINALYSIS DIP (DEVICE)
BILIRUBIN URINE: NEGATIVE
Glucose, UA: NEGATIVE mg/dL
Ketones, ur: NEGATIVE mg/dL
LEUKOCYTES UA: NEGATIVE
Nitrite: NEGATIVE
Protein, ur: 100 mg/dL — AB
Urobilinogen, UA: 0.2 mg/dL (ref 0.0–1.0)
pH: 5.5 (ref 5.0–8.0)

## 2017-05-09 LAB — CBC WITH DIFFERENTIAL/PLATELET
BASOS ABS: 0 {cells}/uL (ref 0–200)
Basophils Relative: 0 %
Eosinophils Absolute: 237 cells/uL (ref 15–500)
Eosinophils Relative: 3 %
HEMATOCRIT: 39.1 % (ref 35.0–45.0)
HEMOGLOBIN: 12.9 g/dL (ref 11.7–15.5)
LYMPHS ABS: 3397 {cells}/uL (ref 850–3900)
Lymphocytes Relative: 43 %
MCH: 29.9 pg (ref 27.0–33.0)
MCHC: 33 g/dL (ref 32.0–36.0)
MCV: 90.7 fL (ref 80.0–100.0)
MONO ABS: 632 {cells}/uL (ref 200–950)
MPV: 11.7 fL (ref 7.5–12.5)
Monocytes Relative: 8 %
NEUTROS PCT: 46 %
Neutro Abs: 3634 cells/uL (ref 1500–7800)
Platelets: 245 10*3/uL (ref 140–400)
RBC: 4.31 MIL/uL (ref 3.80–5.10)
RDW: 14.8 % (ref 11.0–15.0)
WBC: 7.9 10*3/uL (ref 3.8–10.8)

## 2017-05-09 LAB — COMPLETE METABOLIC PANEL WITH GFR
ALBUMIN: 4.2 g/dL (ref 3.6–5.1)
ALK PHOS: 82 U/L (ref 33–130)
ALT: 17 U/L (ref 6–29)
AST: 28 U/L (ref 10–35)
BUN: 13 mg/dL (ref 7–25)
CALCIUM: 9.3 mg/dL (ref 8.6–10.4)
CO2: 24 mmol/L (ref 20–31)
CREATININE: 0.62 mg/dL (ref 0.50–1.05)
Chloride: 104 mmol/L (ref 98–110)
GFR, Est African American: >89 mL/min (ref >=60)
GFR, Est Non African American: >89 mL/min (ref >=60)
Glucose, Bld: 94 mg/dL (ref 65–99)
POTASSIUM: 4.2 mmol/L (ref 3.5–5.3)
Sodium: 140 mmol/L (ref 135–146)
TOTAL PROTEIN: 7.5 g/dL (ref 6.1–8.1)
Total Bilirubin: 0.3 mg/dL (ref 0.2–1.2)

## 2017-05-09 LAB — POCT RAPID STREP A (OFFICE): Rapid Strep A Screen: NEGATIVE

## 2017-05-09 LAB — LIPID PANEL
Cholesterol: 277 mg/dL — ABNORMAL HIGH (ref <200)
HDL: 72 mg/dL (ref >50)
LDL Cholesterol: 177 mg/dL — ABNORMAL HIGH (ref <100)
TRIGLYCERIDES: 139 mg/dL (ref <150)
Total CHOL/HDL Ratio: 3.8 Ratio (ref <5.0)
VLDL: 28 mg/dL (ref <30)

## 2017-05-09 MED ORDER — FLUCONAZOLE 150 MG PO TABS: 150.0000 mg | ORAL_TABLET | Freq: Once | ORAL | 0 refills | Status: AC

## 2017-05-09 MED ORDER — AMOXICILLIN-POT CLAVULANATE 875-125 MG PO TABS: 1.0000 | ORAL_TABLET | Freq: Two times a day (BID) | ORAL | 0 refills | Status: DC

## 2017-05-09 NOTE — Patient Instructions (Addendum)
Sore throat and Voice Hoarseness I am treating you for pharyngitis and starting you on Augmentin 875-125 mg twice daily with food x 10 days. For throat hoarseness, gargle with warm salt water 2-3 times daily and avoid straining voice. Schedule a follow-up in 6 week for PAP and wellness visit.  Pharyngitis Pharyngitis is a sore throat (pharynx). There is redness, pain, and swelling of your throat. Follow these instructions at home:  Drink enough fluids to keep your pee (urine) clear or pale yellow.  Only take medicine as told by your doctor. ? You may get sick again if you do not take medicine as told. Finish your medicines, even if you start to feel better. ? Do not take aspirin.  Rest.  Rinse your mouth (gargle) with salt water ( tsp of salt per 1 qt of water) every 1-2 hours. This will help the pain.  If you are not at risk for choking, you can suck on hard candy or sore throat lozenges. Contact a doctor if:  You have large, tender lumps on your neck.  You have a rash.  You cough up green, yellow-brown, or bloody spit. Get help right away if:  You have a stiff neck.  You drool or cannot swallow liquids.  You throw up (vomit) or are not able to keep medicine or liquids down.  You have very bad pain that does not go away with medicine.  You have problems breathing (not from a stuffy nose). This information is not intended to replace advice given to you by your health care provider. Make sure you discuss any questions you have with your health care provider. Document Released: 04/12/2008 Document Revised: 04/01/2016 Document Reviewed: 07/02/2013 Elsevier Interactive Patient Education  2017 [employer].

## 2017-05-09 NOTE — Progress Notes (Signed)
Patient ID: Rebecca [name], female    DOB: Oct 01, 1956, 61 y.o.   MRN: [id number]  PCP: Scot Jun, FNP  Chief Complaint  Patient presents with  . Establish Care  . Cough  . chest congestion   Subjective:  HPI Rebecca [name] is a 61 y.o. female presents for evaluation to establish care.  Medical history includes: Anxiety, Depression HSV 1 and 2, Hypertension, Insomnia, Peritonsillar Abscess.  Sore Throat/Hoarseness  [name] reports experiencing a worsening sore throat and hoarseness of voice x 4 weeks. She is concerning as she suffered from streptococcal  Infection of the  throat back in March 2018 which subsequently developed into a  peritonsillar abscess.  Initially her throat was only scratchy but now is persistently sore throughout the day on a daily basis. She also reports an associated cough which is nonproductive. Denies any fever, nasal drainage, nasal congestion, nausea, vomiting, abdominal pain, or facial tenderness.  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 She reports being very uncomfortable as she is just recently met a new partner and may at some point become sexually active. She requests to be retested again as can't understand how the test was positive. She reports to her knowledge only experiencing one genital lesion which resolved with antiviral treatment. She feels uncomfortable discussing her diagnosis with her new partner. They have not yet been sexually active but she endorses the use of barrier protection if and when that time comes.  Social History   Social History  . Marital status: Single    Spouse name: N/A  . Number of children: 0  . Years of education: 18.5   Occupational History  . Retired [employer]    [employer] and [employer] x 30 years  . 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 Smoking status: Never Smoker  . Smokeless tobacco: Never Used  . Alcohol  use 2.5 - 3.5 oz/week    5 - 7 drink(s) per week     Comment: 4-5 * week/ 1-2 drinks  . Drug use: No  . Sexual activity: Yes    Partners: Male    Birth control/ protection: Surgical   Other Topics Concern  . Not on file   Social History Narrative   Close friend/significant other killed (GSW) 2012.   Retired after 30 years, and took another job (commutes to [place] 4 days/week).    Family History  Problem Relation Age of Onset  . Cancer Mother        pancreatic cancer  . Diabetes Mother   . Heart disease Mother        rheumatic heart disease  . Hyperlipidemia Mother   . COPD Sister   . Sleep apnea Sister   . Cancer Sister        Lung Cancer  . Cancer Brother        lung cancer  . Cancer Father        renal, bone  . Diabetes Father   . Hyperlipidemia Father   . Heart attack Maternal Grandfather   . Kidney disease Brother   . Colon cancer Maternal Aunt    Review of Systems See history of present illness  Patient Active Problem List   Diagnosis Date Noted  . Elevated blood pressure 07/16/2016  . Anxiety state 07/03/2016  . Neck pain 07/03/2016  . 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 Acid reflux disease 11/27/2014  .  Special screening for malignant neoplasms, colon 09/17/2013  . Obesity, unspecified 01/16/2013  . Spinal stenosis in cervical region 01/11/2013  . Multiple thyroid nodules 01/11/2013  . Hand pain 06/16/2012  . Hyperlipidemia 06/16/2012    Allergies  Allergen Reactions  . Prednisolone Other (See Comments)    Patient can't remember     Prior to Admission medications   Medication Sig Start Date End Date Taking? Authorizing Provider  amLODipine (NORVASC) 2.5 MG tablet Take 1 tablet (2.5 mg total) by mouth daily. Patient not taking: Reported on 01/26/2017 07/27/16   Scot Jun, FNP  clarithromycin (BIAXIN XL) 500 MG 24 hr tablet Take 2 tablets (1,000 mg total) by mouth daily. 01/26/17   McVey, Gelene Mink, PA-C  cyclobenzaprine (FLEXERIL) 10 MG  tablet Take 1 tablet (10 mg total) by mouth 3 (three) times daily as needed for muscle spasms. 07/01/16   Scot Jun, FNP  escitalopram (LEXAPRO) 10 MG tablet Take 2 tablets (20 mg total) by mouth at bedtime. 07/13/16   Scot Jun, FNP  pyridOXINE (VITAMIN B-6) 100 MG tablet Take 100 mg by mouth daily.    [provider]  traZODone (DESYREL) 50 MG tablet Take 0.5-1 tablets (25-50 mg total) by mouth at bedtime as needed for sleep. 06/08/16   Scot Jun, FNP    Past Medical, Surgical Family and Social History reviewed and updated.    Objective:  There were no vitals filed for this visit.  Wt Readings from Last 3 Encounters:  02/03/17 209 lb 9.6 oz (95.1 kg)  02/02/17 210 lb 12.8 oz (95.6 kg)  01/26/17 212 lb (96.2 kg)   Physical Exam  Constitutional: She is oriented to person, place, and time. She appears well-developed and well-nourished.  HENT:  Head: Atraumatic.  Right Ear: External ear normal.  Left Ear: External ear normal.  Nose: Nose normal.  Mouth/Throat: Posterior oropharyngeal erythema present. No oropharyngeal exudate, posterior oropharyngeal edema or tonsillar abscesses.  Eyes: Conjunctivae and EOM are normal. Pupils are equal, round, and reactive to light.  Neck: Normal range of motion. Neck supple.  Cardiovascular: Normal rate, regular rhythm, normal heart sounds and intact distal pulses.   Pulmonary/Chest: Effort normal and breath sounds normal.  Musculoskeletal: Normal range of motion.  Neurological: She is alert and oriented to person, place, and time.  Skin: Skin is warm and dry.  Psychiatric: She has a normal mood and affect. Her behavior is normal. Judgment and thought content normal.    Assessment & Plan:  1. Pharyngitis, unspecified etiology 2. Hoarseness of voice -Rapid Strep-negative -Throat culture pending   3. Essential hypertension, slightly elevated. Patient is not currently taking any antihypertensive medication. - Lipid  panel - CBC with Differential - COMPLETE METABOLIC PANEL WITH GFR  4. 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 Screening for diabetes mellitus - Hemoglobin A1c  RTC: PAP and Chronic Disease Management.   [name]. [name], MSN, FNP-C [facility]  [address] [place], [place] [address] [phone]

## 2017-05-09 NOTE — Telephone Encounter (Signed)
Call patient to advise that culture swab was not obtained only the rapid strep was obtained. Advise her to complete antibiotics and if sore throat hasn't resolved return in order for [place] to obtain a sample for culture.  [name]. [name], MSN, FNP-C [facility]  [address] [place], [place] [address] [phone]

## 2017-05-10 LAB — HSV(HERPES SIMPLEX VRS) I + II AB-IGG
HSV 1 GLYCOPROTEIN G AB, IGG: 47.1 {index} — AB (ref <0.90)
HSV 2 Glycoprotein G Ab, IgG: 3.18 Index — ABNORMAL HIGH (ref <0.90)

## 2017-05-10 LAB — HEMOGLOBIN A1C
Hgb A1c MFr Bld: 5.8 % — ABNORMAL HIGH (ref <5.7)
MEAN PLASMA GLUCOSE: 120 mg/dL

## 2017-05-10 NOTE — Telephone Encounter (Signed)
Left a vm for patient to give me a callback

## 2017-05-10 NOTE — Telephone Encounter (Signed)
Patient notified

## 2017-05-20 ENCOUNTER — Ambulatory Visit: Payer: BC Managed Care – PPO | Admitting: Family Medicine

## 2017-05-27 DIAGNOSIS — R7303 Prediabetes: Secondary | ICD-10-CM | POA: Diagnosis not present

## 2017-05-27 DIAGNOSIS — I10 Essential (primary) hypertension: Secondary | ICD-10-CM | POA: Diagnosis not present

## 2017-05-30 ENCOUNTER — Telehealth: Payer: Self-pay

## 2017-05-30 NOTE — Telephone Encounter (Signed)
Please call patient to advise if she is not feeling any better after antibiotic she will need to come in to be seen.

## 2017-06-02 ENCOUNTER — Ambulatory Visit (INDEPENDENT_AMBULATORY_CARE_PROVIDER_SITE_OTHER): Payer: BC Managed Care – PPO | Admitting: Family Medicine

## 2017-06-02 ENCOUNTER — Encounter: Payer: Self-pay | Admitting: Family Medicine

## 2017-06-02 VITALS — BP 130/90 | HR 73 | Temp 97.7°F | Resp 16 | Ht 63.0 in | Wt 206.0 lb

## 2017-06-02 DIAGNOSIS — J069 Acute upper respiratory infection, unspecified: Secondary | ICD-10-CM | POA: Diagnosis not present

## 2017-06-02 DIAGNOSIS — R0981 Nasal congestion: Secondary | ICD-10-CM

## 2017-06-02 MED ORDER — CETIRIZINE HCL 10 MG PO TABS: 10.0000 mg | ORAL_TABLET | Freq: Every day | ORAL | 2 refills | Status: DC

## 2017-06-02 MED ORDER — IPRATROPIUM BROMIDE 0.03 % NA SOLN: 2.0000 | Freq: Two times a day (BID) | NASAL | 0 refills | Status: DC

## 2017-06-02 MED ORDER — BENZONATATE 100 MG PO CAPS: 100.0000 mg | ORAL_CAPSULE | Freq: Three times a day (TID) | ORAL | 2 refills | Status: DC | PRN

## 2017-06-02 MED ORDER — ESCITALOPRAM OXALATE 10 MG PO TABS: 20.0000 mg | ORAL_TABLET | Freq: Every day | ORAL | 3 refills | Status: DC

## 2017-06-02 NOTE — Patient Instructions (Addendum)
For cough I have prescribed Tessalon Peals, nasal congestion-Atrovent nasal spray and Cetirizine. Take medication as directed. If symptoms do not improve with in 5-7 days, please contact me here at the office to let me know.     Allergies, Adult An allergy is when your body's defense system (immune system) overreacts to an otherwise harmless substance (allergen) that you breathe in or eat or something that touches your skin. When you come into contact with something that you are allergic to, your immune system produces certain proteins (antibodies). These proteins cause cells to release chemicals (histamines) that trigger the symptoms of an allergic reaction. Allergies often affect the nasal passages (allergic rhinitis), eyes (allergic conjunctivitis), skin (atopic dermatitis), and stomach. Allergies can be mild or severe. Allergies cannot spread from person to person (are not contagious). They can develop at any age and may be outgrown. What increases the risk? You may be at greater risk of allergies if other people in your family have allergies. What are the signs or symptoms? Symptoms depend on what type of allergy you have. They may include:  Runny, stuffy nose.  Sneezing.  Itchy mouth, ears, or throat.  Postnasal drip.  Sore throat.  Itchy, red, watery, or puffy eyes.  Skin rash or hives.  Stomach pain.  Vomiting.  Diarrhea.  Bloating.  Wheezing or coughing.  People with a severe allergy to food, medicine, or an insect bite may have a life-threatening allergic reaction (anaphylaxis). Symptoms of anaphylaxis include:  Hives.  Itching.  Flushed face.  Swollen lips, tongue, or mouth.  Tight or swollen throat.  Chest pain or tightness in the chest.  Trouble breathing or shortness of breath.  Rapid heartbeat.  Dizziness or fainting.  Vomiting.  Diarrhea.  Pain in the abdomen.  How is this diagnosed? This condition is diagnosed based on:  Your  symptoms.  Your family and medical history.  A physical exam.  You may need to see a health care provider who specializes in treating allergies (allergist). You may also have tests, including:  Skin tests to see which allergens are causing your symptoms, such as: ? Skin prick test. In this test, your skin is pricked with a tiny needle and exposed to small amounts of possible allergens to see if your skin reacts. ? Intradermal skin test. In this test, a small amount of allergen is injected under your skin to see if your skin reacts. ? Patch test. In this test, a small amount of allergen is placed on your skin and then your skin is covered with a bandage. Your health care provider will check your skin after a couple of days to see if a rash has developed.  Blood tests.  Challenges tests. In this test, you inhale a small amount of allergen by mouth to see if you have an allergic reaction.  You may also be asked to:  Keep a food diary. A food diary is a record of all the foods and drinks you have in a day and any symptoms you experience.  Practice an elimination diet. An elimination diet involves eliminating specific foods from your diet and then adding them back in one by one to find out if a certain food causes an allergic reaction.  How is this treated? Treatment for allergies depends on your symptoms. Treatment may include:  Cold compresses to soothe itching and swelling.  Eye drops.  Nasal sprays.  Using a saline spray or container (neti pot) to flush out the nose (nasal irrigation).  These methods can help clear away mucus and keep the nasal passages moist.  Using a humidifier.  Oral antihistamines or other medicines to block allergic reaction and inflammation.  Skin creams to treat rashes or itching.  Diet changes to eliminate food allergy triggers.  Repeated exposure to tiny amounts of allergens to build up a tolerance and prevent future allergic reactions  (immunotherapy). These include: ? Allergy shots. ? Oral treatment. This involves taking small doses of an allergen under the tongue (sublingual immunotherapy).  Emergency epinephrine injection (auto-injector) in case of an allergic emergency. This is a self-injectable, pre-measured medicine that must be given within the first few minutes of a serious allergic reaction.  Follow these instructions at home:  Avoid known allergens whenever possible.  If you suffer from airborne allergens, wash out your nose daily. You can do this with a saline spray or a neti pot to flush out your nose (nasal irrigation).  Take over-the-counter and prescription medicines only as told by your health care provider.  Keep all follow-up visits as told by your health care provider. This is important.  If you are at risk of a severe allergic reaction (anaphylaxis), keep your auto-injector with you at all times.  If you have ever had anaphylaxis, wear a medical alert bracelet or necklace that states you have a severe allergy. Contact a health care provider if:  Your symptoms do not improve with treatment. Get help right away if:  You have symptoms of anaphylaxis, such as: ? Swollen mouth, tongue, or throat. ? Pain or tightness in your chest. ? Trouble breathing or shortness of breath. ? Dizziness or fainting. ? Severe abdominal pain, vomiting, or diarrhea. This information is not intended to replace advice given to you by your health care provider. Make sure you discuss any questions you have with your health care provider. Document Released: 01/18/2003 Document Revised: 06/24/2016 Document Reviewed: 05/12/2016 Elsevier Interactive Patient Education  [employer].

## 2017-06-02 NOTE — Progress Notes (Signed)
Patient ID: Rebecca [name], female    DOB: Apr 07, 1956, 61 y.o.   MRN: [id number]  PCP: Scot Jun, FNP Chief Complaint  Patient presents with  . Cough  . Nasal Congestion     Subjective:  HPI Rebecca [name] is a 61 y.o. female presents for evaluation of cough and nasal congestion.  Rebecca [name] was diagnosed with pharyngitis on 05/09/2017 and Rebecca [name] was treated with an antibiotic. Today Rebecca [name] reports initial improvement of cough and soreness of throat however symptoms of hoarseness, congestion, and mild cough have returned over the last week. Her greatest concern is hoarseness and nasal congestion. Rebecca [name] coughs only occasionally. Rebecca [name] is outdoors daily engaging in exercise and do not take an for antihistamine. Denies shortness of breath, wheezing, or cough induced with exercise activity. Rebecca [name] has not experienced fever.  Social History   Social History  . Marital status: Single    Spouse name: N/A  . Number of children: 0  . Years of education: 18.5   Occupational History  . Retired [employer]    [employer] and [employer] Services x 30 years  . 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 Smoking status: Never Smoker  . Smokeless tobacco: Never Used  . Alcohol use 2.5 - 3.5 oz/week    5 - 7 drink(s) per week     Comment: 4-5 * week/ 1-2 drinks  . Drug use: No  . Sexual activity: Yes    Partners: Male    Birth control/ protection: Surgical   Other Topics Concern  . Not on file   Social History Narrative   Close friend/significant other killed (GSW) 2012.   Retired after 30 years, and took another job (commutes to [place] 4 days/week).    Family History  Problem Relation Age of Onset  . Cancer Mother        pancreatic cancer  . Diabetes Mother   . Heart disease Mother        rheumatic heart disease  . Hyperlipidemia Mother   . COPD Sister   . Sleep apnea Sister   . Cancer Sister        Lung Cancer  . Cancer Brother    lung cancer  . Cancer Father        renal, bone  . Diabetes Father   . Hyperlipidemia Father   . Heart attack Maternal Grandfather   . Kidney disease Brother   . Colon cancer Maternal Aunt    Review of Systems See HPI Patient Active Problem List   Diagnosis Date Noted  . Elevated blood pressure 07/16/2016  . Anxiety state 07/03/2016  . Neck pain 07/03/2016  . 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 Acid reflux disease 11/27/2014  . Special screening for malignant neoplasms, colon 09/17/2013  . Obesity, unspecified 01/16/2013  . Spinal stenosis in cervical region 01/11/2013  . Multiple thyroid nodules 01/11/2013  . Hand pain 06/16/2012  . Hyperlipidemia 06/16/2012    Allergies  Allergen Reactions  . Prednisolone Other (See Comments)    Patient can't remember     Prior to Admission medications   Medication Sig Start Date End Date Taking? Authorizing Provider  amLODipine (NORVASC) 2.5 MG tablet Take 1 tablet (2.5 mg total) by mouth daily. Patient not taking: Reported on 01/26/2017 07/27/16   Scot Jun, FNP  amoxicillin-clavulanate (AUGMENTIN) 875-125 MG tablet Take 1 tablet by mouth 2 (two) times daily. 05/09/17   Scot Jun, FNP  cyclobenzaprine (FLEXERIL) 10 MG tablet Take 1 tablet (10 mg total) by mouth 3 (three) times daily as needed for muscle spasms. Patient not taking: Reported on 05/09/2017 07/01/16   Scot Jun, FNP  escitalopram (LEXAPRO) 10 MG tablet Take 2 tablets (20 mg total) by mouth at bedtime. Patient not taking: Reported on 05/09/2017 07/13/16   Scot Jun, FNP  pyridOXINE (VITAMIN B-6) 100 MG tablet Take 100 mg by mouth daily.    [provider]  traZODone (DESYREL) 50 MG tablet Take 0.5-1 tablets (25-50 mg total) by mouth at bedtime as needed for sleep. Patient not taking: Reported on 05/09/2017 06/08/16   Scot Jun, FNP    Past Medical, Surgical Family and Social History reviewed and updated.    Objective:  There were no  vitals filed for this visit.  Wt Readings from Last 3 Encounters:  05/09/17 219 lb (99.3 kg)  02/03/17 209 lb 9.6 oz (95.1 kg)  02/02/17 210 lb 12.8 oz (95.6 kg)   Physical Exam  Constitutional: Rebecca [name] is oriented to person, place, and time. Rebecca [name] appears well-developed and well-nourished.  HENT:  Head: Normocephalic and atraumatic.  Right Ear: Hearing, tympanic membrane, external ear and ear canal normal.  Left Ear: Hearing, tympanic membrane, external ear and ear canal normal.  Nose: Rhinorrhea present.  Mouth/Throat: Uvula is midline, oropharynx is clear and moist and mucous membranes are normal.  Eyes: Pupils are equal, round, and reactive to light. Conjunctivae and EOM are normal.  Neck: Normal range of motion. Neck supple.  Cardiovascular: Normal rate, regular rhythm, normal heart sounds and intact distal pulses.   Pulmonary/Chest: Effort normal and breath sounds normal.  Musculoskeletal: Normal range of motion.  Neurological: Rebecca [name] is alert and oriented to person, place, and time.  Skin: Skin is warm and dry.  Psychiatric: Rebecca [name] has a normal mood and affect. Her behavior is normal. Judgment and thought content normal.   Assessment & Plan:  1. Viral upper respiratory tract infection 2. Nasal congestion Treating symptomatic only. Symptoms are likely viral resulting from environmental allergens.  Meds ordered this encounter  Medications  . benzonatate (TESSALON) 100 MG capsule    Sig: Take 1-2 capsules (100-200 mg total) by mouth 3 (three) times daily as needed for cough.    Dispense:  60 capsule    Refill:  2    Order Specific Question:   Supervising Provider    Answer:   Tresa Garter [id number]  . cetirizine (ZYRTEC) 10 MG tablet    Sig: Take 1 tablet (10 mg total) by mouth at bedtime.    Dispense:  90 tablet    Refill:  2    Order Specific Question:   Supervising Provider    Answer:   Tresa Garter [id number]  . ipratropium (ATROVENT) 0.03 % nasal spray    Sig:  Place 2 sprays into both nostrils 2 (two) times daily.    Dispense:  30 mL    Refill:  0    Order Specific Question:   Supervising Provider    Answer:   Tresa Garter [[id number]]    Return for care if symptoms worsen or do not improve.    [name]. [name], MSN, FNP-C [facility]  [address] [place], [place] [address] [phone]

## 2017-06-03 DIAGNOSIS — E559 Vitamin D deficiency, unspecified: Secondary | ICD-10-CM | POA: Diagnosis not present

## 2017-06-03 DIAGNOSIS — I10 Essential (primary) hypertension: Secondary | ICD-10-CM | POA: Diagnosis not present

## 2017-06-20 ENCOUNTER — Ambulatory Visit (INDEPENDENT_AMBULATORY_CARE_PROVIDER_SITE_OTHER): Payer: BC Managed Care – PPO | Admitting: Family Medicine

## 2017-06-20 ENCOUNTER — Encounter: Payer: Self-pay | Admitting: Family Medicine

## 2017-06-20 ENCOUNTER — Other Ambulatory Visit (HOSPITAL_COMMUNITY)
Admission: RE | Admit: 2017-06-20 | Discharge: 2017-06-20 | Disposition: A | Discharge status: Home / Self Care | Payer: BC Managed Care – PPO | Source: Ambulatory Visit | Attending: Family Medicine | Admitting: Family Medicine

## 2017-06-20 VITALS — BP 140/68 | HR 71 | Temp 98.7°F | Resp 16 | Ht 63.0 in | Wt 207.6 lb

## 2017-06-20 DIAGNOSIS — Z01419 Encounter for gynecological examination (general) (routine) without abnormal findings: Secondary | ICD-10-CM | POA: Insufficient documentation

## 2017-06-20 NOTE — Progress Notes (Signed)
Patient ID: Rebecca [name], female    DOB: 31-Dec-1955, 61 y.o.   MRN: [id number]  PCP: Scot Jun, FNP  Chief Complaint  Patient presents with  . Follow-up    6 weeks and pap    Subjective:  HPI Rebecca [name] is a 61 y.o. female presents for routine gynecological evaluation. 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 She is negative for vaginal discharge, odor, vaginal bleeding or dysuria. She is not sexually active at present. Last mammogram 12/2016, reports that the results were normal. She denies any other problems or complaints today. Social History   Social History  . Marital status: Single    Spouse name: N/A  . Number of children: 0  . Years of education: 18.5   Occupational History  . Retired [employer]    [employer] x 30 years  . 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 Smoking status: Never Smoker  . Smokeless tobacco: Never Used  . Alcohol use 2.5 - 3.5 oz/week    5 - 7 drink(s) per week     Comment: 4-5 * week/ 1-2 drinks  . Drug use: No  . Sexual activity: Yes    Partners: Male    Birth control/ protection: Surgical   Other Topics Concern  . Not on file   Social History Narrative   Close friend/significant other killed (GSW) 2012.   Retired after 30 years, and took another job (commutes to [place] 4 days/week).    Family History  Problem Relation Age of Onset  . Cancer Mother        pancreatic cancer  . Diabetes Mother   . Heart disease Mother        rheumatic heart disease  . Hyperlipidemia Mother   . COPD Sister   . Sleep apnea Sister   . Cancer Sister        Lung Cancer  . Cancer Brother        lung cancer  . Cancer Father        renal, bone  . Diabetes Father   . Hyperlipidemia Father   . Heart attack Maternal Grandfather   . Kidney disease Brother   . Colon cancer Maternal Aunt    Review of Systems See HPI Patient  Active Problem List   Diagnosis Date Noted  . Elevated blood pressure 07/16/2016  . Anxiety state 07/03/2016  . Neck pain 07/03/2016  . 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 Acid reflux disease 11/27/2014  . Special screening for malignant neoplasms, colon 09/17/2013  . Obesity, unspecified 01/16/2013  . Spinal stenosis in cervical region 01/11/2013  . Multiple thyroid nodules 01/11/2013  . Hand pain 06/16/2012  . Hyperlipidemia 06/16/2012    Allergies  Allergen Reactions  . Prednisolone Other (See Comments)    Patient can't remember     Prior to Admission medications   Medication Sig Start Date End Date Taking? Authorizing Provider  escitalopram (LEXAPRO) 10 MG tablet Take 2 tablets (20 mg total) by mouth at bedtime. 06/02/17  Yes Scot Jun, FNP  ipratropium (ATROVENT) 0.03 % nasal spray Place 2 sprays into both nostrils 2 (two) times daily. 06/02/17  Yes Scot Jun, FNP  pyridOXINE (VITAMIN B-6) 100 MG tablet Take 100 mg by mouth daily.   Yes [provider]  amLODipine (NORVASC) 2.5 MG tablet Take 1 tablet (2.5 mg total) by  mouth daily. Patient not taking: Reported on 01/26/2017 07/27/16   Scot Jun, FNP  benzonatate (TESSALON) 100 MG capsule Take 1-2 capsules (100-200 mg total) by mouth 3 (three) times daily as needed for cough. Patient not taking: Reported on 06/20/2017 06/02/17   Scot Jun, FNP  cetirizine (ZYRTEC) 10 MG tablet Take 1 tablet (10 mg total) by mouth at bedtime. Patient not taking: Reported on 06/20/2017 06/02/17   Scot Jun, FNP  cyclobenzaprine (FLEXERIL) 10 MG tablet Take 1 tablet (10 mg total) by mouth 3 (three) times daily as needed for muscle spasms. Patient not taking: Reported on 05/09/2017 07/01/16   Scot Jun, FNP  traZODone (DESYREL) 50 MG tablet Take 0.5-1 tablets (25-50 mg total) by mouth at bedtime as needed for sleep. Patient not taking: Reported on 05/09/2017 06/08/16   Scot Jun, FNP  Past Medical,  Surgical Family and Social History reviewed and updated.  Objective:   Today's Vitals   06/20/17 1537  BP: 140/68  Pulse: 71  Resp: 16  Temp: 98.7 F (37.1 C)  TempSrc: Oral  SpO2: 99%  Weight: 207 lb 9.6 oz (94.2 kg)  Height: 5\' 3"  (1.6 m)   Wt Readings from Last 3 Encounters:  06/20/17 207 lb 9.6 oz (94.2 kg)  06/02/17 206 lb (93.4 kg)  05/09/17 219 lb (99.3 kg)    Physical Exam  Constitutional: She appears well-developed and well-nourished.  Cardiovascular: Normal rate, regular rhythm, normal heart sounds and intact distal pulses.   Pulmonary/Chest: Effort normal and breath sounds normal.  Genitourinary:  Genitourinary Comments: Breasts are symmetric without cutaneous changes, nipple inversion or discharge. No masses or tenderness, and no axillary lymphadenopathy. Normal female external genitalia without lesion. No inguinal lymphadenopathy. Vaginal mucosa is pink and moist without lesions. Cervix is closed without discharge, not friable. Pap smear obtained. No cervical motion tenderness, adnexal fullness or tenderness.  Skin: Skin is warm and dry.  Psychiatric: She has a normal mood and affect. Her behavior is normal. Judgment and thought content normal.   Assessment & Plan:  1. Encounter for gynecological examination - Cytology - PAP  RTC: 6 months. Recommend repeat mammogram in February.   [name]. [name], MSN, FNP-C [facility]  [address] [place], [place] [address] [phone]

## 2017-06-20 NOTE — Patient Instructions (Signed)
Genital Herpes Genital herpes is a common sexually transmitted infection (STI) that is caused by a virus. The virus spreads from person to person through sexual contact. Infection can cause itching, blisters, and sores around the genitals or rectum. Symptoms may last several days and then go away This is called an outbreak. However, the virus remains in your body, so you may have more outbreaks in the future. The time between outbreaks varies and can be months or years. Genital herpes affects men and women. It is particularly concerning for pregnant women because the virus can be passed to the baby during delivery and can cause serious problems. Genital herpes is also a concern for people who have a weak disease-fighting (immune) system. What are the causes? This condition is caused by the herpes simplex virus (HSV) type 1 or type 2. The virus may spread through:  Sexual contact with an infected person, including vaginal, anal, and oral sex.  Contact with fluid from a herpes sore.  The skin. This means that you can get herpes from an infected partner even if he or she does not have a visible sore or does not know that he or she is infected.  What increases the risk? You are more likely to develop this condition if:  You have sex with many partners.  You do not use latex condoms during sex.  What are the signs or symptoms? Most people do not have symptoms (asymptomatic) or have mild symptoms that may be mistaken for other skin problems. Symptoms may include:  Small red bumps near the genitals, rectum, or mouth. These bumps turn into blisters and then turn into sores.  Flu-like symptoms, including: ? Fever. ? Body aches. ? Swollen lymph nodes. ? Headache.  Painful urination.  Pain and itching in the genital area or rectal area.  Vaginal discharge.  Tingling or shooting pain in the legs and buttocks.  Generally, symptoms are more severe and last longer during the first (primary)  outbreak. Flu-like symptoms are also more common during the primary outbreak. How is this diagnosed? Genital herpes may be diagnosed based on:  A physical exam.  Your medical history.  Blood tests.  A test of a fluid sample (culture) from an open sore.  How is this treated? There is no cure for this condition, but treatment with antiviral medicines that are taken by mouth (orally) can do the following:  Speed up healing and relieve symptoms.  Help to reduce the spread of the virus to sexual partners.  Limit the chance of future outbreaks, or make future outbreaks shorter.  Lessen symptoms of future outbreaks.  Your health care provider may also recommend pain relief medicines, such as aspirin or ibuprofen. Follow these instructions at home: Sexual activity  Do not have sexual contact during active outbreaks.  Practice safe sex. Latex condoms and female condoms may help prevent the spread of the herpes virus. General instructions  Keep the affected areas dry and clean.  Take over-the-counter and prescription medicines only as told by your health care provider.  Avoid rubbing or touching blisters and sores. If you do touch blisters or sores: ? Wash your hands thoroughly with soap and water. ? Do not touch your eyes afterward.  To help relieve pain or itching, you may take the following actions as directed by your health care provider: ? Apply a cold, wet cloth (cold compress) to affected areas 4-6 times a day. ? Apply a substance that protects your skin and reduces bleeding (astringent). ?  Apply a gel that helps relieve pain around sores (lidocaine gel). ? Take a warm, shallow bath that cleans the genital area (sitz bath).  Keep all follow-up visits as told by your health care provider. This is important. How is this prevented?  Use condoms. Although anyone can get genital herpes during sexual contact, even with the use of a condom, a condom can provide some  protection.  Avoid having multiple sexual partners.  Talk with your sexual partner about any symptoms either of you may have. Also, talk with your partner about any history of STIs.  Get tested for STIs before you have sex. Ask your partner to do the same.  Do not have sexual contact if you have symptoms of genital herpes. Contact a health care provider if:  Your symptoms are not improving with medicine.  Your symptoms return.  You have new symptoms.  You have a fever.  You have abdominal pain.  You have redness, swelling, or pain in your eye.  You notice new sores on other parts of your body.  You are a woman and experience bleeding between menstrual periods.  You have had herpes and you become pregnant or plan to become pregnant. Summary  Genital herpes is a common sexually transmitted infection (STI) that is caused by the herpes simplex virus (HSV) type 1 or type 2.  These viruses are most often spread through sexual contact with an infected person.  You are more likely to develop this condition if you have sex with many partners or you have unprotected sex.  Most people do not have symptoms (asymptomatic) or have mild symptoms that may be mistaken for other skin problems. Symptoms occur as outbreaks that may happen months or years apart.  There is no cure for this condition, but treatment with oral antiviral medicines can reduce symptoms, reduce the chance of spreading the virus to a partner, prevent future outbreaks, or shorten future outbreaks. This information is not intended to replace advice given to you by your health care provider. Make sure you discuss any questions you have with your health care provider. Document Released: 10/22/2000 Document Revised: 09/24/2016 Document Reviewed: 09/24/2016 Elsevier Interactive Patient Education  2017 [place] Maintenance, Female Adopting a healthy lifestyle and getting preventive care can go a long  way to promote health and wellness. Talk with your health care provider about what schedule of regular examinations is right for you. This is a good chance for you to check in with your provider about disease prevention and staying healthy. In between checkups, there are plenty of things you can do on your own. Experts have done a lot of research about which lifestyle changes and preventive measures are most likely to keep you healthy. Ask your health care provider for more information. Weight and diet Eat a healthy diet  Be sure to include plenty of vegetables, fruits, low-fat dairy products, and lean protein.  Do not eat a lot of foods high in solid fats, added sugars, or salt.  Get regular exercise. This is one of the most important things you can do for your health. ? Most adults should exercise for at least 150 minutes each week. The exercise should increase your heart rate and make you sweat (moderate-intensity exercise). ? Most adults should also do strengthening exercises at least twice a week. This is in addition to the moderate-intensity exercise.  Maintain a healthy weight  Body mass index (BMI) is a measurement that  can be used to identify possible weight problems. It estimates body fat based on height and weight. Your health care provider can help determine your BMI and help you achieve or maintain a healthy weight.  For females 14 years of age and older: ? A BMI below 18.5 is considered underweight. ? A BMI of 18.5 to 24.9 is normal. ? A BMI of 25 to 29.9 is considered overweight. ? A BMI of 30 and above is considered obese.  Watch levels of cholesterol and blood lipids  You should start having your blood tested for lipids and cholesterol at 59 years of age, then have this test every 5 years.  You may need to have your cholesterol levels checked more often if: ? Your lipid or cholesterol levels are high. ? You are older than 59 years of age. ? You are at high risk for  heart disease.  Cancer screening Lung Cancer  Lung cancer screening is recommended for adults 23-50 years old who are at high risk for lung cancer because of a history of smoking.  A yearly low-dose CT scan of the lungs is recommended for people who: ? Currently smoke. ? Have quit within the past 15 years. ? Have at least a 30-pack-year history of smoking. A pack year is smoking an average of one pack of cigarettes a day for 1 year.  Yearly screening should continue until it has been 15 years since you quit.  Yearly screening should stop if you develop a health problem that would prevent you from having lung cancer treatment.  Breast Cancer  Practice breast self-awareness. This means understanding how your breasts normally appear and feel.  It also means doing regular breast self-exams. Let your health care provider know about any changes, no matter how small.  If you are in your 20s or 30s, you should have a clinical breast exam (CBE) by a health care provider every 1-3 years as part of a regular health exam.  If you are 46 or older, have a CBE every year. Also consider having a breast X-ray (mammogram) every year.  If you have a family history of breast cancer, talk to your health care provider about genetic screening.  If you are at high risk for breast cancer, talk to your health care provider about having an MRI and a mammogram every year.  Breast cancer gene (BRCA) assessment is recommended for women who have family members with BRCA-related cancers. BRCA-related cancers include: ? Breast. ? Ovarian. ? Tubal. ? Peritoneal cancers.  Results of the assessment will determine the need for genetic counseling and BRCA1 and BRCA2 testing.  Cervical Cancer Your health care provider may recommend that you be screened regularly for cancer of the pelvic organs (ovaries, uterus, and vagina). This screening involves a pelvic examination, including checking for microscopic changes to  the surface of your cervix (Pap test). You may be encouraged to have this screening done every 3 years, beginning at age 34.  For women ages 68-65, health care providers may recommend pelvic exams and Pap testing every 3 years, or they may recommend the Pap and pelvic exam, combined with testing for human papilloma virus (HPV), every 5 years. Some types of HPV increase your risk of cervical cancer. Testing for HPV may also be done on women of any age with unclear Pap test results.  Other health care providers may not recommend any screening for nonpregnant women who are considered low risk for pelvic cancer and who do not have  symptoms. Ask your health care provider if a screening pelvic exam is right for you.  If you have had past treatment for cervical cancer or a condition that could lead to cancer, you need Pap tests and screening for cancer for at least 20 years after your treatment. If Pap tests have been discontinued, your risk factors (such as having a new sexual partner) need to be reassessed to determine if screening should resume. Some women have medical problems that increase the chance of getting cervical cancer. In these cases, your health care provider may recommend more frequent screening and Pap tests.  Colorectal Cancer  This type of cancer can be detected and often prevented.  Routine colorectal cancer screening usually begins at 59 years of age and continues through 59 years of age.  Your health care provider may recommend screening at an earlier age if you have risk factors for colon cancer.  Your health care provider may also recommend using home test kits to check for hidden blood in the stool.  A small camera at the end of a tube can be used to examine your colon directly (sigmoidoscopy or colonoscopy). This is done to check for the earliest forms of colorectal cancer.  Routine screening usually begins at age 35.  Direct examination of the colon should be repeated every  5-10 years through 59 years of age. However, you may need to be screened more often if early forms of precancerous polyps or small growths are found.  Skin Cancer  Check your skin from head to toe regularly.  Tell your health care provider about any new moles or changes in moles, especially if there is a change in a mole's shape or color.  Also tell your health care provider if you have a mole that is larger than the size of a pencil eraser.  Always use sunscreen. Apply sunscreen liberally and repeatedly throughout the day.  Protect yourself by wearing long sleeves, pants, a wide-brimmed hat, and sunglasses whenever you are outside.  Heart disease, diabetes, and high blood pressure  High blood pressure causes heart disease and increases the risk of stroke. High blood pressure is more likely to develop in: ? People who have blood pressure in the high end of the normal range (130-139/85-89 mm Hg). ? People who are overweight or obese. ? People who are African American.  If you are 47-20 years of age, have your blood pressure checked every 3-5 years. If you are 63 years of age or older, have your blood pressure checked every year. You should have your blood pressure measured twice-once when you are at a hospital or clinic, and once when you are not at a hospital or clinic. Record the average of the two measurements. To check your blood pressure when you are not at a hospital or clinic, you can use: ? An automated blood pressure machine at a pharmacy. ? A home blood pressure monitor.  If you are between 66 years and 62 years old, ask your health care provider if you should take aspirin to prevent strokes.  Have regular diabetes screenings. This involves taking a blood sample to check your fasting blood sugar level. ? If you are at a normal weight and have a low risk for diabetes, have this test once every three years after 60 years of age. ? If you are overweight and have a high risk for  diabetes, consider being tested at a younger age or more often. Preventing infection Hepatitis B  If  you have a higher risk for hepatitis B, you should be screened for this virus. You are considered at high risk for hepatitis B if: ? You were born in a country where hepatitis B is common. Ask your health care provider which countries are considered high risk. ? Your parents were born in a high-risk country, and you have not been immunized against hepatitis B (hepatitis B vaccine). ? You have HIV or AIDS. ? You use needles to inject street drugs. ? You live with someone who has hepatitis B. ? You have had sex with someone who has hepatitis B. ? You get hemodialysis treatment. ? You take certain medicines for conditions, including cancer, organ transplantation, and autoimmune conditions.  Hepatitis C  Blood testing is recommended for: ? Everyone born from [birth year] through 1963. ? Anyone with known risk factors for hepatitis C.  Sexually transmitted infections (STIs)  You should be screened for sexually transmitted infections (STIs) including gonorrhea and chlamydia if: ? You are sexually active and are younger than 59 years of age. ? You are older than 59 years of age and your health care provider tells you that you are at risk for this type of infection. ? Your sexual activity has changed since you were last screened and you are at an increased risk for chlamydia or gonorrhea. Ask your health care provider if you are at risk.  If you do not have HIV, but are at risk, it may be recommended that you take a prescription medicine daily to prevent HIV infection. This is called pre-exposure prophylaxis (PrEP). You are considered at risk if: ? You are sexually active and do not regularly use condoms or know the HIV status of your partner(s). ? You take drugs by injection. ? You are sexually active with a partner who has HIV.  Talk with your health care provider about whether you are at high risk  of being infected with HIV. If you choose to begin PrEP, you should first be tested for HIV. You should then be tested every 3 months for as long as you are taking PrEP. Pregnancy  If you are premenopausal and you may become pregnant, ask your health care provider about preconception counseling.  If you may become pregnant, take 400 to 800 micrograms (mcg) of folic acid every day.  If you want to prevent pregnancy, talk to your health care provider about birth control (contraception). Osteoporosis and menopause  Osteoporosis is a disease in which the bones lose minerals and strength with aging. This can result in serious bone fractures. Your risk for osteoporosis can be identified using a bone density scan.  If you are 59 years of age or older, or if you are at risk for osteoporosis and fractures, ask your health care provider if you should be screened.  Ask your health care provider whether you should take a calcium or vitamin D supplement to lower your risk for osteoporosis.  Menopause may have certain physical symptoms and risks.  Hormone replacement therapy may reduce some of these symptoms and risks. Talk to your health care provider about whether hormone replacement therapy is right for you. Follow these instructions at home:  Schedule regular health, dental, and eye exams.  Stay current with your immunizations.  Do not use any tobacco products including cigarettes, chewing tobacco, or electronic cigarettes.  If you are pregnant, do not drink alcohol.  If you are breastfeeding, limit how much and how often you drink alcohol.  Limit alcohol intake to  no more than 1 drink per day for nonpregnant women. One drink equals 12 ounces of beer, 5 ounces of wine, or 1 ounces of hard liquor.  Do not use street drugs.  Do not share needles.  Ask your health care provider for help if you need support or information about quitting drugs.  Tell your health care provider if you often  feel depressed.  Tell your health care provider if you have ever been abused or do not feel safe at home. This information is not intended to replace advice given to you by your health care provider. Make sure you discuss any questions you have with your health care provider. Document Released: 05/10/2011 Document Revised: 04/01/2016 Document Reviewed: 07/29/2015 Elsevier Interactive Patient Education  [employer].

## 2017-06-22 LAB — CYTOLOGY - PAP
BACTERIAL VAGINITIS: POSITIVE — AB
CANDIDA VAGINITIS: POSITIVE — AB
Chlamydia: NEGATIVE
Diagnosis: NEGATIVE
NEISSERIA GONORRHEA: NEGATIVE
Trichomonas: NEGATIVE

## 2017-06-23 MED ORDER — FLUCONAZOLE 150 MG PO TABS: 150.0000 mg | ORAL_TABLET | Freq: Once | ORAL | 0 refills | Status: AC

## 2017-06-23 MED ORDER — METRONIDAZOLE 500 MG PO TABS: 500.0000 mg | ORAL_TABLET | Freq: Two times a day (BID) | ORAL | 0 refills | Status: DC

## 2017-09-19 DIAGNOSIS — R7303 Prediabetes: Secondary | ICD-10-CM | POA: Diagnosis not present

## 2017-09-19 DIAGNOSIS — E782 Mixed hyperlipidemia: Secondary | ICD-10-CM | POA: Diagnosis not present

## 2017-11-02 DIAGNOSIS — R7303 Prediabetes: Secondary | ICD-10-CM | POA: Diagnosis not present

## 2017-11-02 DIAGNOSIS — E782 Mixed hyperlipidemia: Secondary | ICD-10-CM | POA: Diagnosis not present

## 2017-11-02 DIAGNOSIS — I10 Essential (primary) hypertension: Secondary | ICD-10-CM | POA: Diagnosis not present

## 2017-11-25 DIAGNOSIS — E669 Obesity, unspecified: Secondary | ICD-10-CM | POA: Diagnosis not present

## 2017-11-25 DIAGNOSIS — R7303 Prediabetes: Secondary | ICD-10-CM | POA: Diagnosis not present

## 2017-11-25 DIAGNOSIS — I10 Essential (primary) hypertension: Secondary | ICD-10-CM | POA: Diagnosis not present

## 2017-12-09 DIAGNOSIS — I10 Essential (primary) hypertension: Secondary | ICD-10-CM | POA: Diagnosis not present

## 2017-12-09 DIAGNOSIS — R7303 Prediabetes: Secondary | ICD-10-CM | POA: Diagnosis not present

## 2017-12-09 DIAGNOSIS — E669 Obesity, unspecified: Secondary | ICD-10-CM | POA: Diagnosis not present

## 2017-12-23 ENCOUNTER — Ambulatory Visit: Payer: BC Managed Care – PPO | Admitting: Family Medicine

## 2017-12-23 ENCOUNTER — Ambulatory Visit (INDEPENDENT_AMBULATORY_CARE_PROVIDER_SITE_OTHER): Payer: BC Managed Care – PPO | Admitting: Family Medicine

## 2017-12-23 VITALS — BP 127/94 | HR 66 | Temp 98.2°F | Resp 16 | Ht 63.0 in | Wt 211.0 lb

## 2017-12-23 DIAGNOSIS — I10 Essential (primary) hypertension: Secondary | ICD-10-CM | POA: Diagnosis not present

## 2017-12-23 DIAGNOSIS — E785 Hyperlipidemia, unspecified: Secondary | ICD-10-CM

## 2017-12-23 DIAGNOSIS — E782 Mixed hyperlipidemia: Secondary | ICD-10-CM | POA: Diagnosis not present

## 2017-12-23 DIAGNOSIS — R7303 Prediabetes: Secondary | ICD-10-CM | POA: Diagnosis not present

## 2017-12-26 NOTE — Progress Notes (Signed)
Chart opened in error.   Rebecca [name] Moore Wylodean Shimmel  MSN, FNP-C [facility] [address]  [place], [place] [address] [phone]  

## 2018-01-04 DIAGNOSIS — I10 Essential (primary) hypertension: Secondary | ICD-10-CM | POA: Diagnosis not present

## 2018-01-04 DIAGNOSIS — E782 Mixed hyperlipidemia: Secondary | ICD-10-CM | POA: Diagnosis not present

## 2018-01-04 DIAGNOSIS — E559 Vitamin D deficiency, unspecified: Secondary | ICD-10-CM | POA: Diagnosis not present

## 2018-02-03 DIAGNOSIS — R7303 Prediabetes: Secondary | ICD-10-CM | POA: Diagnosis not present

## 2018-02-09 DIAGNOSIS — R7303 Prediabetes: Secondary | ICD-10-CM | POA: Diagnosis not present

## 2018-02-17 DIAGNOSIS — E782 Mixed hyperlipidemia: Secondary | ICD-10-CM | POA: Diagnosis not present

## 2018-02-17 DIAGNOSIS — R7303 Prediabetes: Secondary | ICD-10-CM | POA: Diagnosis not present

## 2018-02-24 DIAGNOSIS — R7303 Prediabetes: Secondary | ICD-10-CM | POA: Diagnosis not present

## 2018-02-24 DIAGNOSIS — E782 Mixed hyperlipidemia: Secondary | ICD-10-CM | POA: Diagnosis not present

## 2018-02-28 ENCOUNTER — Ambulatory Visit: Payer: Self-pay | Admitting: *Deleted

## 2018-02-28 ENCOUNTER — Other Ambulatory Visit: Payer: Self-pay

## 2018-02-28 ENCOUNTER — Encounter: Payer: Self-pay | Admitting: Physician Assistant

## 2018-02-28 ENCOUNTER — Ambulatory Visit (INDEPENDENT_AMBULATORY_CARE_PROVIDER_SITE_OTHER): Payer: BC Managed Care – PPO | Admitting: Physician Assistant

## 2018-02-28 VITALS — BP 122/82 | HR 84 | Temp 98.0°F | Resp 16 | Ht 63.0 in | Wt 201.2 lb

## 2018-02-28 DIAGNOSIS — R197 Diarrhea, unspecified: Secondary | ICD-10-CM | POA: Diagnosis not present

## 2018-02-28 DIAGNOSIS — R112 Nausea with vomiting, unspecified: Secondary | ICD-10-CM | POA: Diagnosis not present

## 2018-02-28 DIAGNOSIS — R319 Hematuria, unspecified: Secondary | ICD-10-CM | POA: Diagnosis not present

## 2018-02-28 LAB — POCT CBC
GRANULOCYTE PERCENT: 71.2 % (ref 37–80)
HEMATOCRIT: 44.7 % (ref 37.7–47.9)
HEMOGLOBIN: 14.1 g/dL (ref 12.2–16.2)
Lymph, poc: 1.7 (ref 0.6–3.4)
MCH, POC: 29.4 pg (ref 27–31.2)
MCHC: 31.7 g/dL — AB (ref 31.8–35.4)
MCV: 92.8 fL (ref 80–97)
MID (cbc): 0.8 (ref 0–0.9)
MPV: 9.5 fL (ref 0–99.8)
PLATELET COUNT, POC: 235 10*3/uL (ref 142–424)
POC GRANULOCYTE: 6.3 (ref 2–6.9)
POC LYMPH %: 19.7 % (ref 10–50)
POC MID %: 9.1 %M (ref 0–12)
RBC: 4.81 M/uL (ref 4.04–5.48)
RDW, POC: 14 %
WBC: 8.8 10*3/uL (ref 4.6–10.2)

## 2018-02-28 LAB — POCT URINALYSIS DIP (MANUAL ENTRY)
BILIRUBIN UA: NEGATIVE mg/dL
Bilirubin, UA: NEGATIVE
GLUCOSE UA: NEGATIVE mg/dL
Leukocytes, UA: NEGATIVE
Nitrite, UA: NEGATIVE
Protein Ur, POC: 100 mg/dL — AB
SPEC GRAV UA: 1.025 (ref 1.010–1.025)
Urobilinogen, UA: 0.2 E.U./dL
pH, UA: 6 (ref 5.0–8.0)

## 2018-02-28 LAB — POC MICROSCOPIC URINALYSIS (UMFC): Mucus: ABSENT

## 2018-02-28 MED ORDER — PROMETHAZINE HCL 25 MG PO TABS: 25.0000 mg | ORAL_TABLET | Freq: Three times a day (TID) | ORAL | 0 refills | Status: DC | PRN

## 2018-02-28 NOTE — Progress Notes (Signed)
Patient ID: Rebecca [name], female    DOB: 08-Apr-1956, 62 y.o.   MRN: [id number]  PCP: Scot Jun, FNP  Chief Complaint  Patient presents with  . Abdominal Pain    lower part since Sunday, N&V around 3 am this morning     Subjective:   Presents for evaluation of abdominal pain, nausea, vomiting and diarrhea for the past several days.  Symptoms began 3 days ago following a meal out with her family. The day prior, she had felt like she wasn't eliminating as well as usual, and so thought she may be constipated. 2 days ago, she took a dose of magnesium citrate which relieved the constipation, and since then she has had diarrhea. This morning about 3 am, she developed 10/10 abdominal pain, nausea and vomiting. She has been drinking water today, though nausea and diarrhea persist.  At the time of her visit this evening, her pain is 7/10.  None of the others she ate with have any symptoms. No fever, chills.    Review of Systems As above    Patient Active Problem List   Diagnosis Date Noted  . Elevated blood pressure 07/16/2016  . Anxiety state 07/03/2016  . Neck pain 07/03/2016  . 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 Acid reflux disease 11/27/2014  . Special screening for malignant neoplasms, colon 09/17/2013  . Obesity, unspecified 01/16/2013  . Spinal stenosis in cervical region 01/11/2013  . Multiple thyroid nodules 01/11/2013  . Hand pain 06/16/2012  . Hyperlipidemia 06/16/2012     Prior to Admission medications   Medication Sig Start Date End Date Taking? Authorizing Provider  cholecalciferol (VITAMIN D) 1000 units tablet Take 1,000 Units by mouth daily.   Yes [provider]  phentermine 37.5 MG capsule Take 37.5 mg by mouth every morning.   Yes [provider]  pyridOXINE (VITAMIN B-6) 100 MG tablet Take 100 mg by mouth daily.   Yes [provider]  amLODipine (NORVASC) 2.5 MG tablet Take 1 tablet (2.5 mg total) by mouth  daily. Patient not taking: Reported on 02/28/2018 07/27/16   Scot Jun, FNP  escitalopram (LEXAPRO) 10 MG tablet Take 2 tablets (20 mg total) by mouth at bedtime. Patient not taking: Reported on 02/28/2018 06/02/17   Scot Jun, FNP  traZODone (DESYREL) 50 MG tablet Take 0.5-1 tablets (25-50 mg total) by mouth at bedtime as needed for sleep. Patient not taking: Reported on 02/28/2018 06/08/16   Scot Jun, FNP     Allergies  Allergen Reactions  . Prednisolone Other (See Comments)    Patient can't remember  Other reaction(s): Delusions (intolerance) Patient can't remember        Objective:  Physical Exam  Constitutional: She is oriented to person, place, and time. She appears well-developed and well-nourished. She is active and cooperative. No distress.  BP 122/82   Pulse 84   Temp 98 F (36.7 C)   Resp 16   Ht 5\' 3"  (1.6 m)   Wt 201 lb 3.2 oz (91.3 kg)   SpO2 98%   BMI 35.64 kg/m   HENT:  Head: Normocephalic and atraumatic.  Right Ear: Hearing normal.  Left Ear: Hearing normal.  Eyes: Conjunctivae are normal. No scleral icterus.  Neck: Normal range of motion. Neck supple. No thyromegaly present.  Cardiovascular: Normal rate, regular rhythm and normal heart sounds.  Pulses:      Radial pulses are 2+ on the right side, and 2+ on the left side.  Pulmonary/Chest: Effort normal and breath sounds normal.  Abdominal: Soft. Normal appearance and bowel sounds are normal. She exhibits no shifting dullness, no distension, no pulsatile liver, no fluid wave, no abdominal bruit, no ascites, no pulsatile midline mass and no mass. There is no hepatosplenomegaly. There is generalized tenderness (worst in the periumbilical region). There is no rigidity, no rebound, no guarding and negative Murphy's sign.  Lymphadenopathy:       Head (right side): No tonsillar, no preauricular, no posterior auricular and no occipital adenopathy present.       Head (left side): No tonsillar,  no preauricular, no posterior auricular and no occipital adenopathy present.    She has no cervical adenopathy.       Right: No supraclavicular adenopathy present.       Left: No supraclavicular adenopathy present.  Neurological: She is alert and oriented to person, place, and time. No sensory deficit.  Skin: Skin is warm, dry and intact. No rash noted. No cyanosis or erythema. Nails show no clubbing.  Psychiatric: She has a normal mood and affect. Her speech is normal and behavior is normal.    Results for orders placed or performed in visit on 02/28/18  POCT urinalysis dipstick  Result Value Ref Range   Color, UA yellow yellow   Clarity, UA clear clear   Glucose, UA negative negative mg/dL   Bilirubin, UA negative negative   Ketones, POC UA negative negative mg/dL   Spec Grav, UA 1.025 1.010 - 1.025   Blood, UA moderate (A) negative   pH, UA 6.0 5.0 - 8.0   Protein Ur, POC =100 (A) negative mg/dL   Urobilinogen, UA 0.2 0.2 or 1.0 E.U./dL   Nitrite, UA Negative Negative   Leukocytes, UA Negative Negative  POCT Microscopic Urinalysis ([facility])  Result Value Ref Range   WBC,UR,HPF,POC None None WBC/hpf   RBC,UR,HPF,POC Few (A) None RBC/hpf   Bacteria Few (A) None, Too numerous to count   Mucus Absent Absent   Epithelial Cells, UR Per Microscopy Moderate (A) None, Too numerous to count cells/hpf  POCT CBC  Result Value Ref Range   WBC 8.8 4.6 - 10.2 K/uL   Lymph, poc 1.7 0.6 - 3.4   POC LYMPH PERCENT 19.7 10 - 50 %L   MID (cbc) 0.8 0 - 0.9   POC MID % 9.1 0 - 12 %M   POC Granulocyte 6.3 2 - 6.9   Granulocyte percent 71.2 37 - 80 %G   RBC 4.81 4.04 - 5.48 M/uL   Hemoglobin 14.1 12.2 - 16.2 g/dL   HCT, POC 44.7 37.7 - 47.9 %   MCV 92.8 80 - 97 fL   MCH, POC 29.4 27 - 31.2 pg   MCHC 31.7 (A) 31.8 - 35.4 g/dL   RDW, POC 14.0 %   Platelet Count, POC 235 142 - 424 K/uL   MPV 9.5 0 - 99.8 fL          Assessment & Plan:   1. Nausea and vomiting, intractability of vomiting  not specified, unspecified vomiting type 2. Diarrhea, unspecified type Likely viral vs food-borne gastroenteritis. Symptomatic care. Rest and hydration. If symptoms worsen/persist, would re-evaluate and consider abdominal imaging, stools studies. - POCT urinalysis dipstick - POCT Microscopic Urinalysis ([facility]) - POCT CBC - Comprehensive metabolic panel - promethazine (PHENERGAN) 25 MG tablet; Take 1 tablet (25 mg total) by mouth every 8 (eight) hours as needed for nausea or vomiting.  Dispense: 20 tablet; Refill: 0   3.  Hematuria, unspecified type Trace red cells on microscopy. Await UCx. Unlikely to be contributory to current symptoms. - Urine Culture    Return if symptoms worsen or fail to improve.   Fara Chute, PA-C [facility]

## 2018-02-28 NOTE — Telephone Encounter (Signed)
Pt reports abdominal cramping, mild initially,onset Friday. States "felt constipated" by Sunday, (LBM was Friday) took MOM with good results, "felt better yesterday." At 0230 this am had episode of vomiting and severe lower abdomen cramping. Cramping presently comes and goes, moderate to severe. Reports severe nausea, no further emesis after episode this AM. Loose stools through Sunday after MOM. Denies fever, dizziness. States "feels weak". Reports recent change in diet; attempting to lose weight. States is staying hydrated. Agent had made appt with C. Jeffrey for today prior to NT. Care advise given.  Reason for Disposition . [1] MODERATE pain (e.g., interferes with normal activities) AND [2] pain comes and goes (cramps) AND [3] present > 24 hours  (Exception: pain with Vomiting or Diarrhea - see that Guideline)  Answer Assessment - Initial Assessment Questions 1. LOCATION: "Where does it hurt?"      Lower abdomen 2. RADIATION: "Does the pain shoot anywhere else?" (e.g., chest, back)     No 3. ONSET: "When did the pain begin?" (e.g., minutes, hours or days ago)      This am 0230 4. SUDDEN: "Gradual or sudden onset?"     gradual 5. PATTERN "Does the pain come and go, or is it constant?"    - If constant: "Is it getting better, staying the same, or worsening?"      (Note: Constant means the pain never goes away completely; most serious pain is constant and it progresses)     - If intermittent: "How long does it last?" "Do you have pain now?"     (Note: Intermittent means the pain goes away completely between bouts)     Constant 6. SEVERITY: "How bad is the pain?"  (e.g., Scale 1-10; mild, moderate, or severe)   - MILD (1-3): doesn\'t interfere with normal activities, abdomen soft and not tender to touch    - MODERATE (4-7): interferes with normal activities or awakens from sleep, tender to touch    - SEVERE (8-10): excruciating pain, doubled over, unable to do any normal activities       10 /10 7. RECURRENT SYMPTOM: "Have you ever had this type of abdominal pain before?" If so, ask: "When was the last time?" and "What happened that time?"      No 8. CAUSE: "What do you think is causing the abdominal pain?"     Changed diet, ate a lot of different things over last few days 9. RELIEVING/AGGRAVATING FACTORS: "What makes it better or worse?" (e.g., movement, antacids, bowel movement)     Felt better yesterday, ate 10. OTHER SYMPTOMS: "Has there been any vomiting, diarrhea, constipation, or urine problems?"       Nausea  Protocols used: ABDOMINAL PAIN - [facility]

## 2018-02-28 NOTE — Progress Notes (Signed)
Subjective:    Patient ID: Rebecca [name], female    DOB: July 19, 1956, 62 y.o.   MRN: [id number] Chief Complaint  Patient presents with  . Abdominal Pain    lower part since Sunday, N&V around 3 am this morning     HPI  62 yo female presents for evaluation of lower abdominal pain for 3 days. She has been feeling constipated 2 days ago and tried some magnesium citrate which relieved constipation and led to watery stools which continued throughout the weekend. Woke up 3am with severe abdominal cramps (10/10 pain) and chills and began vomiting.  Abdominal pains continued despite vomiting. Saturday night she reports going out to dinner and had some shrimp, fried rice and vegetables. No one else who was with her developed any similar symptoms.  She feels fatigued and tired, abdominal pain continues (7/10). Has not had a bowel movement since 12 today.   Denies any blood in her stool. Stool appears to be mostly watery, with some "murkiness". Denies any foul-smelling diarrhea.  Vomit consisted mostly of food. Has continued to eat and drink throughout the weekend and into today.   Denies vaginal discharge, bleeding, pain, fevers   Review of Systems  Constitutional: Positive for activity change, appetite change and chills. Negative for diaphoresis, fatigue and fever.  HENT: Negative.   Eyes: Negative.   Respiratory: Negative.   Cardiovascular: Negative for chest pain, palpitations and leg swelling.  Gastrointestinal: Positive for abdominal pain, constipation, diarrhea, nausea and vomiting. Negative for anal bleeding and blood in stool.  Endocrine: Negative.   Genitourinary: Negative for difficulty urinating, dysuria, flank pain, urgency, vaginal bleeding, vaginal discharge and vaginal pain.  Musculoskeletal: Positive for back pain (lower).  Skin: Negative.   Allergic/Immunologic: Negative.   Neurological: Positive for light-headedness. Negative for dizziness and syncope.  Hematological:  Negative.   Psychiatric/Behavioral: Negative.      Patient Active Problem List   Diagnosis Date Noted  . Elevated blood pressure 07/16/2016  . Anxiety state 07/03/2016  . Neck pain 07/03/2016  . 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 Acid reflux disease 11/27/2014  . Special screening for malignant neoplasms, colon 09/17/2013  . Obesity, unspecified 01/16/2013  . Spinal stenosis in cervical region 01/11/2013  . Multiple thyroid nodules 01/11/2013  . Hand pain 06/16/2012  . Hyperlipidemia 06/16/2012    Past Medical History:  Diagnosis Date  . Hyperlipidemia   . Obesity   . Thyroid disease    monitoring a nodule 1/9    Prior to Admission medications   Medication Sig Start Date End Date Taking? Authorizing Provider  cholecalciferol (VITAMIN D) 1000 units tablet Take 1,000 Units by mouth daily.   Yes [provider]  phentermine 37.5 MG capsule Take 37.5 mg by mouth every morning.   Yes [provider]  pyridOXINE (VITAMIN B-6) 100 MG tablet Take 100 mg by mouth daily.   Yes [provider]  amLODipine (NORVASC) 2.5 MG tablet Take 1 tablet (2.5 mg total) by mouth daily. Patient not taking: Reported on 02/28/2018 07/27/16   Scot Jun, FNP  benzonatate (TESSALON) 100 MG capsule Take 1-2 capsules (100-200 mg total) by mouth 3 (three) times daily as needed for cough. Patient not taking: Reported on 02/28/2018 06/02/17   Scot Jun, FNP  cetirizine (ZYRTEC) 10 MG tablet Take 1 tablet (10 mg total) by mouth at bedtime. Patient not taking: Reported on 02/28/2018 06/02/17   Scot Jun, FNP  cyclobenzaprine (FLEXERIL) 10 MG tablet Take 1 tablet (10  mg total) by mouth 3 (three) times daily as needed for muscle spasms. Patient not taking: Reported on 02/28/2018 07/01/16   Scot Jun, FNP  escitalopram (LEXAPRO) 10 MG tablet Take 2 tablets (20 mg total) by mouth at bedtime. Patient not taking: Reported on 02/28/2018 06/02/17   Scot Jun, FNP    ipratropium (ATROVENT) 0.03 % nasal spray Place 2 sprays into both nostrils 2 (two) times daily. Patient not taking: Reported on 02/28/2018 06/02/17   Scot Jun, FNP  metroNIDAZOLE (FLAGYL) 500 MG tablet Take 1 tablet (500 mg total) by mouth 2 (two) times daily with a meal. DO NOT CONSUME ALCOHOL WHILE TAKING THIS MEDICATION. Patient not taking: Reported on 02/28/2018 06/23/17   Scot Jun, FNP  traZODone (DESYREL) 50 MG tablet Take 0.5-1 tablets (25-50 mg total) by mouth at bedtime as needed for sleep. Patient not taking: Reported on 02/28/2018 06/08/16   Scot Jun, FNP    Allergies  Allergen Reactions  . Prednisolone Other (See Comments)    Patient can't remember         Objective:   Physical Exam  Constitutional: She is oriented to person, place, and time. She appears well-developed and well-nourished. No distress.  BP 122/82   Pulse 84   Temp 98 F (36.7 C)   Resp 16   Ht 5\' 3"  (1.6 m)   Wt 201 lb 3.2 oz (91.3 kg)   SpO2 98%   BMI 35.64 kg/m    HENT:  Head: Normocephalic and atraumatic.  Cardiovascular: Normal rate, regular rhythm, normal heart sounds and intact distal pulses. Exam reveals no gallop and no friction rub.  No murmur heard. Pulmonary/Chest: Effort normal and breath sounds normal. No respiratory distress. She has no wheezes. She has no rales.  Abdominal: She exhibits no distension, no fluid wave, no ascites, no pulsatile midline mass and no mass. Bowel sounds are increased. There is tenderness in the epigastric area, periumbilical area and suprapubic area. There is no rigidity, no rebound, no guarding, no tenderness at McBurney's point and negative Murphy's sign.  Musculoskeletal: Normal range of motion.  Lymphadenopathy:    She has cervical adenopathy.  Neurological: She is alert and oriented to person, place, and time.  Skin: Skin is warm and dry. No rash noted. She is not diaphoretic. No erythema. No pallor.  Psychiatric: She has a  normal mood and affect. Her behavior is normal.   Results for orders placed or performed in visit on 02/28/18  POCT urinalysis dipstick  Result Value Ref Range   Color, UA yellow yellow   Clarity, UA clear clear   Glucose, UA negative negative mg/dL   Bilirubin, UA negative negative   Ketones, POC UA negative negative mg/dL   Spec Grav, UA 1.025 1.010 - 1.025   Blood, UA moderate (A) negative   pH, UA 6.0 5.0 - 8.0   Protein Ur, POC =100 (A) negative mg/dL   Urobilinogen, UA 0.2 0.2 or 1.0 E.U./dL   Nitrite, UA Negative Negative   Leukocytes, UA Negative Negative  POCT Microscopic Urinalysis ([facility])  Result Value Ref Range   WBC,UR,HPF,POC None None WBC/hpf   RBC,UR,HPF,POC Few (A) None RBC/hpf   Bacteria Few (A) None, Too numerous to count   Mucus Absent Absent   Epithelial Cells, UR Per Microscopy Moderate (A) None, Too numerous to count cells/hpf  POCT CBC  Result Value Ref Range   WBC 8.8 4.6 - 10.2 K/uL   Lymph, poc 1.7 0.6 -  3.4   POC LYMPH PERCENT 19.7 10 - 50 %L   MID (cbc) 0.8 0 - 0.9   POC MID % 9.1 0 - 12 %M   POC Granulocyte 6.3 2 - 6.9   Granulocyte percent 71.2 37 - 80 %G   RBC 4.81 4.04 - 5.48 M/uL   Hemoglobin 14.1 12.2 - 16.2 g/dL   HCT, POC 44.7 37.7 - 47.9 %   MCV 92.8 80 - 97 fL   MCH, POC 29.4 27 - 31.2 pg   MCHC 31.7 (A) 31.8 - 35.4 g/dL   RDW, POC 14.0 %   Platelet Count, POC 235 142 - 424 K/uL   MPV 9.5 0 - 99.8 fL       Assessment & Plan:  1. Nausea and vomiting, intractability of vomiting not specified, unspecified vomiting type 2. Diarrhea, unspecified type Use phenergan to help with nausea and vomiting. Await urine cx, doubt hematuria is contributory. Treat supportively.  - POCT urinalysis dipstick - POCT Microscopic Urinalysis ([facility]) - POCT CBC - Comprehensive metabolic panel - promethazine (PHENERGAN) 25 MG tablet; Take 1 tablet (25 mg total) by mouth every 8 (eight) hours as needed for nausea or vomiting.  Dispense: 20 tablet;  Refill: 0  3. Hematuria, unspecified type Await urine culture, unlikely to be contributing factor.  Epithelial cells indicate non-clean catch specimen. Does not need follow up. - Urine Culture   Return if symptoms worsen or fail to improve.

## 2018-02-28 NOTE — Patient Instructions (Addendum)
   IF you received an x-ray today, you will receive an invoice from [facility]. Please contact [facility] at [phone] with questions or concerns regarding your invoice.   IF you received labwork today, you will receive an invoice from [facility]. Please contact [facility] at [phone] with questions or concerns regarding your invoice.   Our billing staff will not be able to assist you with questions regarding bills from these companies.  You will be contacted with the lab results as soon as they are available. The fastest way to get your results is to activate your My Chart account. Instructions are located on the last page of this paperwork. If you have not heard from us regarding the results in 2 weeks, please contact this office.      Viral Gastroenteritis, Adult Viral gastroenteritis is also known as the stomach flu. This condition is caused by certain germs (viruses). These germs can be passed from person to person very easily (are very contagious). This condition can cause sudden watery poop (diarrhea), fever, and throwing up (vomiting). Having watery poop and throwing up can make you feel weak and cause you to get dehydrated. Dehydration can make you tired and thirsty, make you have a dry mouth, and make it so you pee (urinate) less often. Older adults and people with other diseases or a weak defense system (immune system) are at higher risk for dehydration. It is important to replace the fluids that you lose from having watery poop and throwing up. Follow these instructions at home: Follow instructions from your doctor about how to care for yourself at home. Eating and drinking  Follow these instructions as told by your doctor:  Take an oral rehydration solution (ORS). This is a drink that is sold at pharmacies and stores.  Drink clear fluids in small amounts as you are able, such as: ? Water. ? Ice chips. ? Diluted fruit juice. ? Low-calorie sports  drinks.  Eat bland, easy-to-digest foods in small amounts as you are able, such as: ? Bananas. ? Applesauce. ? Rice. ? Low-fat (lean) meats. ? Toast. ? Crackers.  Avoid fluids that have a lot of sugar or caffeine in them.  Avoid alcohol.  Avoid spicy or fatty foods.  General instructions  Drink enough fluid to keep your pee (urine) clear or pale yellow.  Wash your hands often. If you cannot use soap and water, use hand sanitizer.  Make sure that all people in your home wash their hands well and often.  Rest at home while you get better.  Take over-the-counter and prescription medicines only as told by your doctor.  Watch your condition for any changes.  Take a warm bath to help with any burning or pain from having watery poop.  Keep all follow-up visits as told by your doctor. This is important. Contact a doctor if:  You cannot keep fluids down.  Your symptoms get worse.  You have new symptoms.  You feel light-headed or dizzy.  You have muscle cramps. Get help right away if:  You have chest pain.  You feel very weak or you pass out (faint).  You see blood in your throw-up.  Your throw-up looks like coffee grounds.  You have bloody or black poop (stools) or poop that look like tar.  You have a very bad headache, a stiff neck, or both.  You have a rash.  You have very bad pain, cramping, or bloating in your belly (abdomen).  You have trouble breathing.    You are breathing very quickly.  Your heart is beating very quickly.  Your skin feels cold and clammy.  You feel confused.  You have pain when you pee.  You have signs of dehydration, such as: ? Dark pee, hardly any pee, or no pee. ? Cracked lips. ? Dry mouth. ? Sunken eyes. ? Sleepiness. ? Weakness. This information is not intended to replace advice given to you by your health care provider. Make sure you discuss any questions you have with your health care provider. Document Released:  04/12/2008 Document Revised: 05/14/2016 Document Reviewed: 07/01/2015 Elsevier Interactive Patient Education  2017 [place] Choices to Help Relieve Diarrhea, Adult When you have diarrhea, the foods you eat and your eating habits are very important. Choosing the right foods and drinks can help:  Relieve diarrhea.  Replace lost fluids and nutrients.  Prevent dehydration.  What general guidelines should I follow? Relieving diarrhea  Choose foods with less than 2 g or .07 oz. of fiber per serving.  Limit fats to less than 8 tsp (38 g or 1.34 oz.) a day.  Avoid the following: ? Foods and beverages sweetened with high-fructose corn syrup, honey, or sugar alcohols such as xylitol, sorbitol, and mannitol. ? Foods that contain a lot of fat or sugar. ? Fried, greasy, or spicy foods. ? High-fiber grains, breads, and cereals. ? Raw fruits and vegetables.  Eat foods that are rich in probiotics. These foods include dairy products such as yogurt and fermented milk products. They help increase healthy bacteria in the stomach and intestines (gastrointestinal tract, or GI tract).  If you have lactose intolerance, avoid dairy products. These may make your diarrhea worse.  Take medicine to help stop diarrhea (antidiarrheal medicine) only as told by your health care provider. Replacing nutrients  Eat small meals or snacks every 3-4 hours.  Eat bland foods, such as white rice, toast, or baked potato, until your diarrhea starts to get better. Gradually reintroduce nutrient-rich foods as tolerated or as told by your health care provider. This includes: ? Well-cooked protein foods. ? Peeled, seeded, and soft-cooked fruits and vegetables. ? Low-fat dairy products.  Take vitamin and mineral supplements as told by your health care provider. Preventing dehydration   Start by sipping water or a special solution to prevent dehydration (oral rehydration solution, ORS). Urine that is clear or  pale yellow means that you are getting enough fluid.  Try to drink at least 8-10 cups of fluid each day to help replace lost fluids.  You may add other liquids in addition to water, such as clear juice or decaffeinated sports drinks, as tolerated or as told by your health care provider.  Avoid drinks with caffeine, such as coffee, tea, or soft drinks.  Avoid alcohol. What foods are recommended? The items listed may not be a complete list. Talk with your health care provider about what dietary choices are best for you. Grains White rice. White, [place], or pita breads (fresh or toasted), including plain rolls, buns, or bagels. White pasta. Saltine, soda, or graham crackers. Pretzels. Low-fiber cereal. Cooked cereals made with water (such as cornmeal, farina, or cream cereals). Plain muffins. Matzo. Melba toast. Zwieback. Vegetables Potatoes (without the skin). Most well-cooked and canned vegetables without skins or seeds. Tender lettuce. Fruits Apple sauce. Fruits canned in juice. Cooked apricots, cherries, grapefruit, peaches, pears, or plums. Fresh bananas and cantaloupe. Meats and other protein foods Baked or boiled chicken. Eggs. Tofu. Fish. Seafood. Smooth nut butters. Ground or  well-cooked tender beef, ham, veal, lamb, pork, or poultry. Dairy Plain yogurt, kefir, and unsweetened liquid yogurt. Lactose-free milk, buttermilk, skim milk, or soy milk. Low-fat or nonfat hard cheese. Beverages Water. Low-calorie sports drinks. Fruit juices without pulp. Strained tomato and vegetable juices. Decaffeinated teas. Sugar-free beverages not sweetened with sugar alcohols. Oral rehydration solutions, if approved by your health care provider. Seasoning and other foods Bouillon, broth, or soups made from recommended foods. What foods are not recommended? The items listed may not be a complete list. Talk with your health care provider about what dietary choices are best for you. Grains Whole grain,  whole wheat, bran, or rye breads, rolls, pastas, and crackers. Wild or brown rice. Whole grain or bran cereals. Barley. Oats and oatmeal. Corn tortillas or taco shells. Granola. Popcorn. Vegetables Raw vegetables. Fried vegetables. Cabbage, broccoli, Brussels sprouts, artichokes, baked beans, beet greens, corn, kale, legumes, peas, sweet potatoes, and yams. Potato skins. Cooked spinach and cabbage. Fruits Dried fruit, including raisins and dates. Raw fruits. Stewed or dried prunes. Canned fruits with syrup. Meat and other protein foods Fried or fatty meats. Deli meats. Chunky nut butters. Nuts and seeds. Beans and lentils. Berniece Salines. Hot dogs. Sausage. Dairy High-fat cheeses. Whole milk, chocolate milk, and beverages made with milk, such as milk shakes. Half-and-half. Cream. sour cream. Ice cream. Beverages Caffeinated beverages (such as coffee, tea, soda, or energy drinks). Alcoholic beverages. Fruit juices with pulp. Prune juice. Soft drinks sweetened with high-fructose corn syrup or sugar alcohols. High-calorie sports drinks. Fats and oils Butter. Cream sauces. Margarine. Salad oils. Plain salad dressings. Olives. Avocados. Mayonnaise. Sweets and desserts Sweet rolls, doughnuts, and sweet breads. Sugar-free desserts sweetened with sugar alcohols such as xylitol and sorbitol. Seasoning and other foods Honey. Hot sauce. Chili powder. Gravy. Cream-based or milk-based soups. Pancakes and waffles. Summary  When you have diarrhea, the foods you eat and your eating habits are very important.  Make sure you get at least 8-10 cups of fluid each day, or enough to keep your urine clear or pale yellow.  Eat bland foods and gradually reintroduce healthy, nutrient-rich foods as tolerated, or as told by your health care provider.  Avoid high-fiber, fried, greasy, or spicy foods. This information is not intended to replace advice given to you by your health care provider. Make sure you discuss any questions  you have with your health care provider. Document Released: 01/15/2004 Document Revised: 10/22/2016 Document Reviewed: 10/22/2016 Elsevier Interactive Patient Education  [employer].

## 2018-03-01 LAB — COMPREHENSIVE METABOLIC PANEL
ALBUMIN: 4.4 g/dL (ref 3.5–5.5)
ALK PHOS: 94 IU/L (ref 39–117)
ALT: 13 IU/L (ref 0–32)
AST: 20 IU/L (ref 0–40)
Albumin/Globulin Ratio: 1.5 (ref 1.2–2.2)
BUN/Creatinine Ratio: 11 (ref 9–23)
BUN: 8 mg/dL (ref 6–24)
Bilirubin Total: 0.6 mg/dL (ref 0.0–1.2)
CO2: 26 mmol/L (ref 20–29)
Calcium: 9.5 mg/dL (ref 8.7–10.2)
Chloride: 100 mmol/L (ref 96–106)
Creatinine, Ser: 0.71 mg/dL (ref 0.57–1.00)
GFR calc Af Amer: 108 mL/min/{1.73_m2} (ref >59)
GFR calc non Af Amer: 94 mL/min/{1.73_m2} (ref >59)
GLUCOSE: 87 mg/dL (ref 65–99)
Globulin, Total: 3 g/dL (ref 1.5–4.5)
Potassium: 4.3 mmol/L (ref 3.5–5.2)
Sodium: 141 mmol/L (ref 134–144)
Total Protein: 7.4 g/dL (ref 6.0–8.5)

## 2018-03-01 LAB — URINE CULTURE

## 2018-03-10 DIAGNOSIS — R7303 Prediabetes: Secondary | ICD-10-CM | POA: Diagnosis not present

## 2018-03-15 ENCOUNTER — Emergency Department (HOSPITAL_COMMUNITY): Payer: BC Managed Care – PPO

## 2018-03-15 ENCOUNTER — Encounter: Payer: Self-pay | Admitting: Physician Assistant

## 2018-03-15 ENCOUNTER — Emergency Department (HOSPITAL_COMMUNITY)
Admission: EM | Admit: 2018-03-15 | Discharge: 2018-03-15 | Disposition: A | Discharge status: Home / Self Care | Payer: BC Managed Care – PPO | Attending: Emergency Medicine | Admitting: Emergency Medicine

## 2018-03-15 ENCOUNTER — Encounter (HOSPITAL_COMMUNITY): Payer: Self-pay | Admitting: Emergency Medicine

## 2018-03-15 ENCOUNTER — Other Ambulatory Visit: Payer: Self-pay

## 2018-03-15 ENCOUNTER — Ambulatory Visit (INDEPENDENT_AMBULATORY_CARE_PROVIDER_SITE_OTHER): Payer: BC Managed Care – PPO | Admitting: Physician Assistant

## 2018-03-15 VITALS — BP 172/88 | HR 70 | Temp 97.9°F | Resp 18 | Ht 62.4 in | Wt 202.6 lb

## 2018-03-15 DIAGNOSIS — F0781 Postconcussional syndrome: Secondary | ICD-10-CM | POA: Diagnosis not present

## 2018-03-15 DIAGNOSIS — M25552 Pain in left hip: Secondary | ICD-10-CM | POA: Diagnosis not present

## 2018-03-15 DIAGNOSIS — M25512 Pain in left shoulder: Secondary | ICD-10-CM | POA: Diagnosis not present

## 2018-03-15 DIAGNOSIS — Y999 Unspecified external cause status: Secondary | ICD-10-CM | POA: Insufficient documentation

## 2018-03-15 DIAGNOSIS — Z041 Encounter for examination and observation following transport accident: Secondary | ICD-10-CM | POA: Diagnosis present

## 2018-03-15 DIAGNOSIS — R42 Dizziness and giddiness: Secondary | ICD-10-CM | POA: Diagnosis not present

## 2018-03-15 DIAGNOSIS — Y929 Unspecified place or not applicable: Secondary | ICD-10-CM | POA: Diagnosis not present

## 2018-03-15 DIAGNOSIS — M542 Cervicalgia: Secondary | ICD-10-CM | POA: Insufficient documentation

## 2018-03-15 DIAGNOSIS — R51 Headache: Secondary | ICD-10-CM | POA: Diagnosis not present

## 2018-03-15 DIAGNOSIS — Y939 Activity, unspecified: Secondary | ICD-10-CM | POA: Diagnosis not present

## 2018-03-15 DIAGNOSIS — I10 Essential (primary) hypertension: Secondary | ICD-10-CM | POA: Insufficient documentation

## 2018-03-15 DIAGNOSIS — S0990XA Unspecified injury of head, initial encounter: Secondary | ICD-10-CM | POA: Diagnosis not present

## 2018-03-15 DIAGNOSIS — M5442 Lumbago with sciatica, left side: Secondary | ICD-10-CM | POA: Insufficient documentation

## 2018-03-15 DIAGNOSIS — R030 Elevated blood-pressure reading, without diagnosis of hypertension: Secondary | ICD-10-CM

## 2018-03-15 MED ORDER — CYCLOBENZAPRINE HCL 10 MG PO TABS: 10.0000 mg | ORAL_TABLET | Freq: Two times a day (BID) | ORAL | 0 refills | Status: DC | PRN

## 2018-03-15 MED ORDER — ACETAMINOPHEN 500 MG PO TABS: 500.0000 mg | ORAL_TABLET | Freq: Four times a day (QID) | ORAL | 0 refills | Status: DC | PRN

## 2018-03-15 MED ORDER — IBUPROFEN 600 MG PO TABS: 600.0000 mg | ORAL_TABLET | Freq: Four times a day (QID) | ORAL | 0 refills | Status: DC | PRN

## 2018-03-15 NOTE — Discharge Instructions (Addendum)
Follow-up with your primary care physician in 1 week for blood pressure check and reevaluation of your current symptoms.  Alternate 600 mg of ibuprofen and [phone] mg of Tylenol every 3 hours as needed for pain. Do not exceed 4000 mg of Tylenol daily. You may take Flexeril up to twice daily as needed for muscle spasms. This medication may make you drowsy, so I typically only recommended at night. If this medication makes you drowsy throughout the day, no driving, drinking alcohol, or operating heavy machinery. You may also cut these tablets in half. Ice to areas of soreness for the next few days and then may move to heat. Do some gentle stretching throughout the day, especially during hot showers or baths. Take short frequent walks and avoid prolonged periods of sitting or laying. Expect to be sore for the next few day and follow up with primary care physician for recheck of ongoing symptoms but return to ER for emergent changing or worsening of symptoms such as severe headache that gets worse, altered mental status/behaving unusually, persistent vomiting, excessive drowsiness, numbness to the arms or legs, unsteady gait, or slurred speech.
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
## 2018-03-15 NOTE — Patient Instructions (Addendum)
   Please go to [facility] ED immediately for further evaluation and management. Thank you for letting me participate in your health and well being.    IF you received an x-ray today, you will receive an invoice from [facility]. Please contact [facility] at [phone] with questions or concerns regarding your invoice.   IF you received labwork today, you will receive an invoice from [facility]. Please contact [facility] at [phone] with questions or concerns regarding your invoice.   Our billing staff will not be able to assist you with questions regarding bills from these companies.  You will be contacted with the lab results as soon as they are available. The fastest way to get your results is to activate your My Chart account. Instructions are located on the last page of this paperwork. If you have not heard from [place] regarding the results in 2 weeks, please contact this office.
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
## 2018-03-15 NOTE — ED Notes (Signed)
Patient transported to CT 

## 2018-03-15 NOTE — ED Notes (Signed)
Pt is c/oneck , bilateral lower back pain that I worse on left side. Pt also reports that she has a HA. Pt denies visual  changes but does report having dizziness.

## 2018-03-17 ENCOUNTER — Encounter: Payer: Self-pay | Admitting: Family Medicine

## 2018-03-17 ENCOUNTER — Ambulatory Visit (INDEPENDENT_AMBULATORY_CARE_PROVIDER_SITE_OTHER): Payer: BC Managed Care – PPO | Admitting: Family Medicine

## 2018-03-17 VITALS — BP 142/76 | HR 55 | Ht 62.4 in | Wt 205.0 lb

## 2018-03-17 DIAGNOSIS — S060X0A Concussion without loss of consciousness, initial encounter: Secondary | ICD-10-CM
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
## 2018-03-17 NOTE — Patient Instructions (Signed)
Please try to avoid aggravating factors. You can try to use melatonin to help with sleep.  He can also use for shoulder help with inflammation. He can use Tylenol or ibuprofen help with the headache. Please go slow when rising from a seated or position.  You may want to try to use compression stockings. Please follow-up with me in 1 week.
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
## 2018-03-23 ENCOUNTER — Ambulatory Visit (INDEPENDENT_AMBULATORY_CARE_PROVIDER_SITE_OTHER): Payer: BC Managed Care – PPO | Admitting: Family Medicine

## 2018-03-23 VITALS — BP 134/72 | HR 63 | Temp 98.3°F | Ht 62.4 in | Wt 202.0 lb

## 2018-03-23 DIAGNOSIS — S060X0D Concussion without loss of consciousness, subsequent encounter: Secondary | ICD-10-CM
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
## 2018-03-23 NOTE — Patient Instructions (Signed)
Please follow up with me in one week  I have made a referral to physical therapy.

## 2018-03-24 DIAGNOSIS — R7303 Prediabetes: Secondary | ICD-10-CM | POA: Diagnosis not present

## 2018-03-24 NOTE — Assessment & Plan Note (Addendum)
Symptoms still ongoing  - referral to PT for VO rehab  - counseled on rehab and symptom management  - counseled on work and driving  - counseled on sleep and medications  - f/u in one week  - provided work to work from home.

## 2018-03-30 ENCOUNTER — Encounter: Payer: Self-pay | Admitting: Physical Therapy

## 2018-03-30 ENCOUNTER — Ambulatory Visit: Payer: BC Managed Care – PPO | Attending: Family Medicine | Admitting: Physical Therapy

## 2018-03-30 ENCOUNTER — Other Ambulatory Visit: Payer: Self-pay

## 2018-03-30 DIAGNOSIS — R42 Dizziness and giddiness: Secondary | ICD-10-CM

## 2018-03-30 DIAGNOSIS — R262 Difficulty in walking, not elsewhere classified: Secondary | ICD-10-CM | POA: Insufficient documentation

## 2018-03-30 DIAGNOSIS — M542 Cervicalgia: Secondary | ICD-10-CM | POA: Diagnosis present

## 2018-03-31 ENCOUNTER — Encounter: Payer: Self-pay | Admitting: Family Medicine

## 2018-03-31 ENCOUNTER — Ambulatory Visit (INDEPENDENT_AMBULATORY_CARE_PROVIDER_SITE_OTHER): Payer: BC Managed Care – PPO | Admitting: Family Medicine

## 2018-03-31 DIAGNOSIS — S060X0D Concussion without loss of consciousness, subsequent encounter: Secondary | ICD-10-CM

## 2018-03-31 MED ORDER — AMITRIPTYLINE HCL 50 MG PO TABS: 50.0000 mg | ORAL_TABLET | Freq: Every day | ORAL | 1 refills | Status: DC
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
## 2018-03-31 NOTE — Assessment & Plan Note (Signed)
Symptoms are ongoing. Feels little improvement and anxiety seems to be worse.  - elavil initiated  - has started PT this week  - work note provided to allow her to stay home and work  - f/u in one week.

## 2018-03-31 NOTE — Patient Instructions (Signed)
Please try the medication  Please see how your symptoms do on this medication   Sleep is an integral part of our bodies ability to recover from our daily activities and is key to making you body perform at its maximal potential.  Establishing and maintaining a healthy sleep pattern can not only make you feel better it can help many chronic illnesses including high blood pressure, high cholesterol, obesity, chronic pain syndromes, and many others.  Some key things to remember regarding sleep are:  Establish a consistent nightly routine that you do each night before bed.  Avoid caffeine, tobacco and alcohol as all of these drugs will cause sleep disturbances.    Establish an exercise routine.  This can be as simple as walking, dancing or what every you find gets your heart rate elevated to the point you can speak in only 3-4 word sentences.  Exercise will help make falling and staying asleep easier.    If you have difficulty with sleep, reserve the bedroom for sleeping; do not watch TV, read, eat or exercise in your bed room.      - Watching TV in bed can trick your brain into thinking it is day time and will reset your internal clock.  If you are going to watch TV before bed do so outside of the bedroom.  Although it is best to avoid screens for the hour prior to bed that is difficult to do in our current technology driven society - at the very least it is imperative that you remove TV from the bed room.      - If you have a hard time falling asleep avoid showering and exercising prior to bed.
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
## 2018-04-05 ENCOUNTER — Ambulatory Visit (INDEPENDENT_AMBULATORY_CARE_PROVIDER_SITE_OTHER): Payer: BC Managed Care – PPO | Admitting: Family Medicine

## 2018-04-05 ENCOUNTER — Encounter: Payer: Self-pay | Admitting: Family Medicine

## 2018-04-05 VITALS — BP 126/80 | HR 84 | Temp 98.0°F | Ht 62.4 in | Wt 198.0 lb

## 2018-04-05 DIAGNOSIS — F419 Anxiety disorder, unspecified: Secondary | ICD-10-CM

## 2018-04-05 DIAGNOSIS — Z09 Encounter for follow-up examination after completed treatment for conditions other than malignant neoplasm: Secondary | ICD-10-CM | POA: Diagnosis not present

## 2018-04-05 DIAGNOSIS — Z131 Encounter for screening for diabetes mellitus: Secondary | ICD-10-CM | POA: Diagnosis not present

## 2018-04-05 DIAGNOSIS — J329 Chronic sinusitis, unspecified: Secondary | ICD-10-CM | POA: Diagnosis not present

## 2018-04-05 LAB — POCT URINALYSIS DIP (MANUAL ENTRY)
Bilirubin, UA: NEGATIVE
Glucose, UA: NEGATIVE mg/dL
Ketones, POC UA: NEGATIVE mg/dL
Leukocytes, UA: NEGATIVE
Nitrite, UA: NEGATIVE
Protein Ur, POC: 100 mg/dL — AB
Spec Grav, UA: 1.025 (ref 1.010–1.025)
Urobilinogen, UA: 0.2 E.U./dL
pH, UA: 6 (ref 5.0–8.0)

## 2018-04-05 LAB — POCT GLYCOSYLATED HEMOGLOBIN (HGB A1C): Hemoglobin A1C: 5.4 % (ref 4.0–5.6)

## 2018-04-05 MED ORDER — AMOXICILLIN-POT CLAVULANATE 875-125 MG PO TABS: 1.0000 | ORAL_TABLET | Freq: Two times a day (BID) | ORAL | 0 refills | Status: DC
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
## 2018-04-06 ENCOUNTER — Ambulatory Visit (INDEPENDENT_AMBULATORY_CARE_PROVIDER_SITE_OTHER): Payer: BC Managed Care – PPO | Admitting: Family Medicine

## 2018-04-06 ENCOUNTER — Encounter: Payer: Self-pay | Admitting: Family Medicine

## 2018-04-06 VITALS — BP 142/78 | HR 78 | Temp 98.6°F | Ht 64.2 in | Wt 202.0 lb

## 2018-04-06 DIAGNOSIS — S060X0D Concussion without loss of consciousness, subsequent encounter: Secondary | ICD-10-CM | POA: Diagnosis not present

## 2018-04-06 LAB — URINALYSIS, MICROSCOPIC ONLY
Casts: NONE SEEN /lpf
Epithelial Cells (non renal): >10 /hpf — AB (ref 0–10)

## 2018-04-06 MED ORDER — NORTRIPTYLINE HCL 25 MG PO CAPS: 25.0000 mg | ORAL_CAPSULE | Freq: Two times a day (BID) | ORAL | 0 refills | Status: DC
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
## 2018-04-06 NOTE — Assessment & Plan Note (Signed)
Symptoms still ongoing. Tried elavil but did quit after 3 days due to intolerance (bad dreams).  - try nortriptyline - can follow up in 2 weeks  - work note provided.

## 2018-04-06 NOTE — Patient Instructions (Signed)
Please follow up with me in 2 weeks  Please try the medication and see how you tolerate it  Please try to do exercise as you tolerate

## 2018-04-07 LAB — URINE CULTURE

## 2018-04-13 ENCOUNTER — Encounter: Payer: Self-pay | Admitting: Physical Therapy

## 2018-04-13 ENCOUNTER — Ambulatory Visit: Payer: BC Managed Care – PPO | Attending: Family Medicine | Admitting: Physical Therapy

## 2018-04-13 DIAGNOSIS — M542 Cervicalgia: Secondary | ICD-10-CM | POA: Insufficient documentation

## 2018-04-13 DIAGNOSIS — R42 Dizziness and giddiness: Secondary | ICD-10-CM

## 2018-04-13 DIAGNOSIS — R262 Difficulty in walking, not elsewhere classified: Secondary | ICD-10-CM | POA: Insufficient documentation

## 2018-04-13 NOTE — Therapy (Addendum)
[facility] [address] [place], [place], [address] Phone: [phone]   Fax:  [phone]  Physical Therapy Treatment  Patient Details  Name: Rebecca [name] MRN: [id number] Date of Birth: 12/20/1955 Referring Provider: Rosemarie Ax, MD   Encounter Date: 04/13/2018  PT End of Session - 04/13/18 1300    Visit Number  2    Number of Visits  17    Date for PT Re-Evaluation  05/30/18    Authorization Type  [employer], [facility]    PT Start Time  0801    PT Stop Time  0847    PT Time Calculation (min)  46 min    Activity Tolerance  Other (comment) limitd due to guarding neck movement    Behavior During Therapy  [facility] for tasks assessed/performed       Past Medical History:  Diagnosis Date  . Hyperlipidemia   . Obesity   . Peritonsillar abscess 02/03/2017  . Thyroid disease    monitoring a nodule 1/9    Past Surgical History:  Procedure Laterality Date  . ABDOMINAL HYSTERECTOMY      There were no vitals filed for this visit.  Subjective Assessment - 04/13/18 0803    Subjective  Had f/u appointment with physician who started her on a new medication for headaches.  Headaches are less intense but still feels a little foggy.  Went for a walk, x 1 mile, on Saturday - had some LLE pain afterwards but did well.      Pertinent History  anxiety, obesity, spinal stenosis in cervical region, thyroid nodules, and hyperlipidemia    Limitations  Walking;Other (comment) working    Diagnostic tests  CT scan of head and c-spine    Patient Stated Goals  Return to walking (avid walker), relieve L neck pain, return to work    Currently in Pain?  Yes    Pain Score  7     Pain Location  Neck    Pain Orientation  Left    Pain Descriptors / Indicators  Sore    Pain Onset  1 to 4 weeks ago         [facility] PT Assessment - 04/13/18 0805      Functional Gait  Assessment   Gait assessed   Yes    Gait Level Surface  Walks 20 ft in  less than 7 sec but greater than 5.5 sec, uses assistive device, slower speed, mild gait deviations, or deviates 6-10 in outside of the 12 in walkway width.    Change in Gait Speed  Makes only minor adjustments to walking speed, or accomplishes a change in speed with significant gait deviations, deviates 10-15 in outside the 12 in walkway width, or changes speed but loses balance but is able to recover and continue walking.    Gait with Horizontal Head Turns  Performs head turns smoothly with slight change in gait velocity (eg, minor disruption to smooth gait path), deviates 6-10 in outside 12 in walkway width, or uses an assistive device.    Gait with Vertical Head Turns  Performs task with slight change in gait velocity (eg, minor disruption to smooth gait path), deviates 6 - 10 in outside 12 in walkway width or uses assistive device    Gait and Pivot Turn  Pivot turns safely within 3 sec and stops quickly with no loss of balance.    Step Over Obstacle  Is able to step over one shoe box (4.5  in total height) but must slow down and adjust steps to clear box safely. May require verbal cueing.    Gait with Narrow Base of Support  Is able to ambulate for 10 steps heel to toe with no staggering.    Gait with Eyes Closed  Walks 20 ft, slow speed, abnormal gait pattern, evidence for imbalance, deviates 10-15 in outside 12 in walkway width. Requires more than 9 sec to ambulate 20 ft.    Ambulating Backwards  Walks 20 ft, uses assistive device, slower speed, mild gait deviations, deviates 6-10 in outside 12 in walkway width.    Steps  Alternating feet, must use rail.    Total Score  19    FGA comment:  19/30 medium falls risk; pain in LLE increased to 9/10         Vestibular Assessment - 04/13/18 0817      Vestibular Assessment   General Observation  no dizziness at rest this morning      Symptom Behavior   Type of Dizziness  Imbalance    Frequency of Dizziness  daily    Duration of Dizziness   seconds    Aggravating Factors  Mornings;Supine to sit    Relieving Factors  Slow movements pause before moving      Occulomotor Exam   Occulomotor Alignment  Normal    Spontaneous  Absent    Gaze-induced  Absent    Smooth Pursuits  Intact lightheaded looking up, uncoordinated    Saccades  Comment;Slow vertical - lightheadedness    Comment  convergence impaired - L eye ABD.  + for Skew deviation - L eye rests in outward position      Vestibulo-Occular Reflex   VOR to Slow Head Movement  Comment neck very guarded    VOR Cancellation  Unable to maintain gaze guarded neck    Comment  HIT: Unable to test due to guarded neck      Visual Acuity   Static  unable to test due to guarded neck      Positional Testing   Sidelying Test  Sidelying Right;Sidelying Left    Horizontal Canal Testing  Horizontal Canal Right;Horizontal Canal Left      Sidelying Right   Sidelying Right Duration  0    Sidelying Right Symptoms  Other (comment) neck pain, lightheaded      Sidelying Left   Sidelying Left Duration  0    Sidelying Left Symptoms  No nystagmus feels lightheaded      Horizontal Canal Right   Horizontal Canal Right Duration  0    Horizontal Canal Right Symptoms  Normal      Horizontal Canal Left   Horizontal Canal Left Duration  0    Horizontal Canal Left Symptoms  Normal      Positional Sensitivities   Sit to Supine  Mild dizziness    Supine to Left Side  No dizziness    Supine to Right Side  Mild dizziness    Supine to Sitting  Mild dizziness    Nose to Right Knee  No dizziness    Right Knee to Sitting  No dizziness    Nose to Left Knee  No dizziness    Left Knee to Sitting  Lightheadedness    Head Turning x 5  Moderate dizziness    Head Nodding x 5  Moderate dizziness    Pivot Right in Standing  No dizziness    Pivot Left in Standing  No dizziness  Rolling Right  Mild dizziness    Rolling Left  No dizziness                       PT Education - 04/13/18  1259    Education provided  Yes    Education Details  benefit of counseling for post-traumatic symptoms and anxiety/lack of sleep; falls risk and clinical findings with FGA and vestibular assessment    Person(s) Educated  Patient    Methods  Explanation    Comprehension  Verbalized understanding       PT Short Term Goals - 03/31/18 1230      PT SHORT TERM GOAL #1   Title  (ALL STG DUE BY 04/30/18)  Pt will participate in further assessment of balance during gait with FGA and will initiate balance HEP    Time  4    Period  Weeks    Status  New    Target Date  04/30/18      PT SHORT TERM GOAL #2   Title  Pt will participate in full vestibular evaluation and initiate vestibular HEP    Time  4    Period  Weeks    Status  New    Target Date  04/30/18      PT SHORT TERM GOAL #3   Title  Pt will initiate cervical stretching/strengthening HEP    Time  4    Period  Weeks    Status  New    Target Date  04/30/18      PT SHORT TERM GOAL #4   Title  Pt will participate in endurance assessment with 6 min walk test and initiate walking program    Time  4    Period  Weeks    Status  New    Target Date  04/30/18      PT SHORT TERM GOAL #5   Title  Pt will improve gait velocity to >/= 3.0 ft/sec     Baseline  2.60ft/sec    Time  4    Period  Weeks    Status  New    Target Date  04/30/18        PT Long Term Goals - 03/31/18 1235      PT LONG TERM GOAL #1   Title  (ALL LTG DUE BY 05/30/18)  Pt will demonstrate independence with HEP: balance, vestibular, cervical spine and walking program    Time  8    Period  Weeks    Status  New    Target Date  05/30/18      PT LONG TERM GOAL #2   Title  Pt will improve FGA by 4 points to decrease falls risk    Baseline  TBD    Time  8    Period  Weeks    Status  New    Target Date  05/30/18      PT LONG TERM GOAL #3   Title  Pt will improve gait velocity to >/= 3.6 ft/sec    Time  8    Period  Weeks    Status  New    Target Date   05/30/18      PT LONG TERM GOAL #4   Title  Pt will improve endurance by increasing distance on 6 minute walk test by 150'    Baseline  TBD    Time  8    Period  Weeks    Status  New    Target Date  05/30/18      PT LONG TERM GOAL #5   Title  Pt will improve cervical AROM by 10 degrees and report 50% reduction in headache, neck and L shoulder pain    Time  8    Period  Weeks    Status  New    Target Date  05/30/18      Additional Long Term Goals   Additional Long Term Goals  Yes      PT LONG TERM GOAL #6   Title  Vestibular goal TBD            Plan - 04/13/18 0817    Clinical Impression Statement  Completed further assessment of balance, falls risk and vestibular system.  Based on FGA pt is at medium falls risk with higher level challenges including head turns, stepping over obstacles, and decreased use of vision.  Based on vestibular assessment pt does not appear to have positional vertigo but does present with visual and oculomotor impairments and significant motion sensitivity.  Unable to determine if pt has vestibular hypofunction; pt demonstrated significant guarding of neck movement and unable to test HIT or DVA.  Pt also continues to report significant L neck and LLE pain.  Will continue to assess and will initiate HEP next session.    Rehab Potential  Good    PT Frequency  2x / week    PT Duration  8 weeks    PT Treatment/Interventions  ADLs/Self Care Home Management;Canalith Repostioning;Cryotherapy;Electrical Stimulation;Moist Heat;Traction;Gait training;Functional mobility training;Therapeutic activities;Therapeutic exercise;Balance training;Neuromuscular re-education;Patient/family education;Manual techniques;Passive range of motion;Dry needling;Vestibular    PT Next Visit Plan  LLE assessment; Initiate cervical spine manual therapy and HEP, vestibular HEP, discuss dry needling.    Consulted and Agree with Plan of Care  Patient       Patient will benefit from  skilled therapeutic intervention in order to improve the following deficits and impairments:  Decreased activity tolerance, Decreased balance, Decreased range of motion, Decreased strength, Difficulty walking, Dizziness, Impaired perceived functional ability, Impaired sensation, Pain  Visit Diagnosis: Cervicalgia  Dizziness and giddiness  Difficulty in walking, not elsewhere classified     Problem List Patient Active Problem List   Diagnosis Date Noted  . Concussion with no loss of consciousness 03/17/2018  . Elevated blood pressure 07/16/2016  . Anxiety state 07/03/2016  . Neck pain 07/03/2016  . 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 Acid reflux disease 11/27/2014  . Special screening for malignant neoplasms, colon 09/17/2013  . Obesity, unspecified 01/16/2013  . Spinal stenosis in cervical region 01/11/2013  . Multiple thyroid nodules 01/11/2013  . Hand pain 06/16/2012  . Hyperlipidemia 06/16/2012    Rico Junker, PT, DPT 04/13/18    1:08 PM    [facility] [address] [place], [place], [address] Phone: [phone]   Fax:  [phone]  Name: Rebecca [name] MRN: [id number] Date of Birth: 1956-03-02

## 2018-04-14 DIAGNOSIS — R7303 Prediabetes: Secondary | ICD-10-CM | POA: Diagnosis not present

## 2018-04-14 DIAGNOSIS — E782 Mixed hyperlipidemia: Secondary | ICD-10-CM | POA: Diagnosis not present

## 2018-04-19 ENCOUNTER — Ambulatory Visit (INDEPENDENT_AMBULATORY_CARE_PROVIDER_SITE_OTHER): Payer: BC Managed Care – PPO | Admitting: Family Medicine

## 2018-04-19 ENCOUNTER — Encounter: Payer: Self-pay | Admitting: Family Medicine

## 2018-04-19 VITALS — BP 138/86 | HR 74 | Temp 98.0°F | Ht 64.2 in | Wt 194.0 lb

## 2018-04-19 DIAGNOSIS — F4310 Post-traumatic stress disorder, unspecified: Secondary | ICD-10-CM | POA: Diagnosis not present

## 2018-04-19 DIAGNOSIS — G4700 Insomnia, unspecified: Secondary | ICD-10-CM | POA: Diagnosis not present

## 2018-04-19 DIAGNOSIS — Z09 Encounter for follow-up examination after completed treatment for conditions other than malignant neoplasm: Secondary | ICD-10-CM | POA: Diagnosis not present

## 2018-04-19 DIAGNOSIS — F329 Major depressive disorder, single episode, unspecified: Secondary | ICD-10-CM

## 2018-04-19 DIAGNOSIS — F32A Depression, unspecified: Secondary | ICD-10-CM

## 2018-04-19 DIAGNOSIS — F419 Anxiety disorder, unspecified: Secondary | ICD-10-CM

## 2018-04-19 MED ORDER — ESCITALOPRAM OXALATE 10 MG PO TABS: 10.0000 mg | ORAL_TABLET | Freq: Every day | ORAL | 0 refills | Status: DC

## 2018-04-19 MED ORDER — TRAZODONE HCL 50 MG PO TABS: 25.0000 mg | ORAL_TABLET | Freq: Every evening | ORAL | 3 refills | Status: DC | PRN
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
## 2018-04-20 ENCOUNTER — Ambulatory Visit (INDEPENDENT_AMBULATORY_CARE_PROVIDER_SITE_OTHER): Payer: BC Managed Care – PPO | Admitting: Family Medicine

## 2018-04-20 ENCOUNTER — Encounter: Payer: Self-pay | Admitting: Family Medicine

## 2018-04-20 DIAGNOSIS — S060X0D Concussion without loss of consciousness, subsequent encounter: Secondary | ICD-10-CM
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
## 2018-04-20 NOTE — Patient Instructions (Signed)
Good to see you  Please see me in two weeks  Please hold off on the lexapro for now.
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
## 2018-04-21 ENCOUNTER — Telehealth: Payer: Self-pay | Admitting: Family Medicine

## 2018-04-21 ENCOUNTER — Ambulatory Visit: Payer: BC Managed Care – PPO | Admitting: Physical Therapy

## 2018-04-21 ENCOUNTER — Encounter: Payer: Self-pay | Admitting: Physical Therapy

## 2018-04-21 DIAGNOSIS — Z0279 Encounter for issue of other medical certificate: Secondary | ICD-10-CM

## 2018-04-21 DIAGNOSIS — R42 Dizziness and giddiness: Secondary | ICD-10-CM

## 2018-04-21 DIAGNOSIS — M542 Cervicalgia: Secondary | ICD-10-CM | POA: Diagnosis not present

## 2018-04-21 NOTE — Telephone Encounter (Signed)
FLMA forms have been completed&signed, Faxed to [facility] @ [phone], Copy sent to scan &charged for.   Original is ready to patient to pick up at her appointment on 05/04/18.
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
## 2018-04-26 ENCOUNTER — Ambulatory Visit: Payer: BC Managed Care – PPO | Admitting: Physical Therapy

## 2018-04-26 ENCOUNTER — Encounter: Payer: Self-pay | Admitting: Physical Therapy

## 2018-04-26 DIAGNOSIS — M542 Cervicalgia: Secondary | ICD-10-CM | POA: Diagnosis not present

## 2018-04-26 DIAGNOSIS — R42 Dizziness and giddiness: Secondary | ICD-10-CM

## 2018-04-26 NOTE — Therapy (Signed)
[facility] [address] [place], [place], [address] Phone: [phone]   Fax:  [phone]  Physical Therapy Treatment  Patient Details  Name: Rebecca [name] MRN: [id number] Date of Birth: [birth year] Referring Provider: Rosemarie Ax, MD   Encounter Date: 04/26/2018   04/26/18 0854  PT Visits / Re-Eval  Visit Number 4  Number of Visits 17  Date for PT Re-Evaluation 05/30/18  Authorization  Authorization Type Sherre Poot, [facility]  PT Time Calculation  PT Start Time [phone]  PT Stop Time 0940  PT Time Calculation (min) 50 min  PT - End of Session  Activity Tolerance Patient tolerated treatment well  Behavior During Therapy [facility] for tasks assessed/performed      Past Medical History:  Diagnosis Date  . Hyperlipidemia   . Obesity   . Peritonsillar abscess 02/03/2017  . Thyroid disease    monitoring a nodule 1/9    Past Surgical History:  Procedure Laterality Date  . ABDOMINAL HYSTERECTOMY      There were no vitals filed for this visit.  Subjective Assessment - 04/26/18 0850    Subjective  No falls, some stumbling. Does report neck is better, however does still have headaches. Feels this could be due to not sleeping well at night. Did go to the gym since last visit and used bike for about 15 minutes, then sat in the sauna.     Pertinent History  anxiety, obesity, spinal stenosis in cervical region, thyroid nodules, and hyperlipidemia    Limitations  Walking;Other (comment)    Diagnostic tests  CT scan of head and c-spine    Patient Stated Goals  Return to walking (avid walker), relieve L neck pain, return to work    Currently in Pain?  Yes    Pain Score  7     Pain Location  Neck and headache    Pain Descriptors / Indicators  Aching;Spasm    Pain Type  Acute pain    Pain Onset  1 to 4 weeks ago    Pain Frequency  Constant    Aggravating Factors   lack of sleep, stress    Pain Relieving Factors  relaxation          04/26/18 0854  Exercises  Other Exercises  pt performed ex's/stretches issued to HEP today. cues needed on ex form and correct hold times.   Moist Heat Therapy  Number Minutes Moist Heat 15 Minutes  Moist Heat Location Cervical  Electrical Stimulation  Electrical Stimulation Location left cervical/scapular area  Electrical Stimulation Action for decreased pain and tighness  Electrical Stimulation Parameters IFC with intensity to tolerance.   Electrical Stimulation Goals Pain  Manual Therapy  Manual Therapy Soft tissue mobilization;Myofascial release;Neural Stretch;Muscle Energy Technique;Manual Traction  Manual therapy comments all manual therapy performed for decreased pain and for increased tissue extensibility. pt continues to bevery tight and  was less guarded with all manual therapy. light to medium pressure this session with gentle stretching only as well.                 Soft tissue mobilization to bil cervical paraspinals, upper traps and scalenes  Myofascial Release to bil cervical paraspinals, upper traps and rhomboids  Manual Traction suboccipital release for 30-45 sec holds x 5-6 reps, progressing to gentle cervivcal distraction for up to 1 minute holds for 4-5 reps  Muscle Energy Technique gentle cervical distraction concurrent with scapular retraction x 10 reps, then concurrent with scapular depression x  10 reps; bil passive manual scapular depression concurrent with cervical retraction for 5 sec holds x 10 reps.   Neural Stretch concurrent with gentle cervical distraction with gentle overpressure to shoulders for scapular depression to enhance stretching    issued the following to HEP today.        Access Code: [email or web address]  URL: [email or web address]  Date: 04/26/2018  Prepared by: Willow Ora   Exercises  Seated Gaze Stabilization with Head Rotation - 3 reps - 2x daily - 7x weekly  Seated Horizontal Saccades - 10 reps - 2 sets - 2x daily - 7x  weekly  Wide Stance with Eyes Closed on Foam Pad - 3 reps - 30 hold - 1x daily - 5x weekly  Seated Levator Scapulae Stretch - 3 reps - 30 hold - 2x daily - 7x weekly  Seated Cervical Sidebending Stretch - 3 reps - 30 hold - 2x daily - 7x weekly   PT Education - 04/26/18 2229    Education provided  Yes    Education Details  HEP     Person(s) Educated  Patient    Methods  Explanation;Demonstration;Verbal cues;Handout    Comprehension  Verbalized understanding;Returned demonstration;Verbal cues required;Need further instruction       PT Short Term Goals - 03/31/18 1230      PT SHORT TERM GOAL #1   Title  (ALL STG DUE BY 04/30/18)  Pt will participate in further assessment of balance during gait with FGA and will initiate balance HEP    Time  4    Period  Weeks    Status  New    Target Date  04/30/18      PT SHORT TERM GOAL #2   Title  Pt will participate in full vestibular evaluation and initiate vestibular HEP    Time  4    Period  Weeks    Status  New    Target Date  04/30/18      PT SHORT TERM GOAL #3   Title  Pt will initiate cervical stretching/strengthening HEP    Time  4    Period  Weeks    Status  New    Target Date  04/30/18      PT SHORT TERM GOAL #4   Title  Pt will participate in endurance assessment with 6 min walk test and initiate walking program    Time  4    Period  Weeks    Status  New    Target Date  04/30/18      PT SHORT TERM GOAL #5   Title  Pt will improve gait velocity to >/= 3.0 ft/sec     Baseline  2.42ft/sec    Time  4    Period  Weeks    Status  New    Target Date  04/30/18        PT Long Term Goals - 03/31/18 1235      PT LONG TERM GOAL #1   Title  (ALL LTG DUE BY 05/30/18)  Pt will demonstrate independence with HEP: balance, vestibular, cervical spine and walking program    Time  8    Period  Weeks    Status  New    Target Date  05/30/18      PT LONG TERM GOAL #2   Title  Pt will improve FGA by 4 points to decrease falls risk      Baseline  TBD    Time  8  Period  Weeks    Status  New    Target Date  05/30/18      PT LONG TERM GOAL #3   Title  Pt will improve gait velocity to >/= 3.6 ft/sec    Time  8    Period  Weeks    Status  New    Target Date  05/30/18      PT LONG TERM GOAL #4   Title  Pt will improve endurance by increasing distance on 6 minute walk test by 150'    Baseline  TBD    Time  8    Period  Weeks    Status  New    Target Date  05/30/18      PT LONG TERM GOAL #5   Title  Pt will improve cervical AROM by 10 degrees and report 50% reduction in headache, neck and L shoulder pain    Time  8    Period  Weeks    Status  New    Target Date  05/30/18      Additional Long Term Goals   Additional Long Term Goals  Yes      PT LONG TERM GOAL #6   Title  Vestibular goal TBD          04/26/18 0854  Plan  Clinical Impression Statement Today's skilled session continued to address cervical pain/tightness and issued HEP as well. PT is progressing toward goals with decreased pain to 3/10 after session. Pt should benefit from continued PT to progress toward unmet goals.   Pt will benefit from skilled therapeutic intervention in order to improve on the following deficits Decreased activity tolerance;Decreased balance;Decreased range of motion;Decreased strength;Difficulty walking;Dizziness;Impaired perceived functional ability;Impaired sensation;Pain  Rehab Potential Good  PT Frequency 2x / week  PT Duration 8 weeks  PT Treatment/Interventions ADLs/Self Care Home Management;Canalith Repostioning;Cryotherapy;Electrical Stimulation;Moist Heat;Traction;Gait training;Functional mobility training;Therapeutic activities;Therapeutic exercise;Balance training;Neuromuscular re-education;Patient/family education;Manual techniques;Passive range of motion;Dry needling;Vestibular  PT Next Visit Plan LLE assessment; continue to address cervical spine manual therapy and HEP, vestibular HEP, discuss dry needling.   Consulted and Agree with Plan of Care Patient         Patient will benefit from skilled therapeutic intervention in order to improve the following deficits and impairments:  Decreased activity tolerance, Decreased balance, Decreased range of motion, Decreased strength, Difficulty walking, Dizziness, Impaired perceived functional ability, Impaired sensation, Pain  Visit Diagnosis: Cervicalgia  Dizziness and giddiness    Problem List Patient Active Problem List   Diagnosis Date Noted  . Concussion with no loss of consciousness 03/17/2018  . Elevated blood pressure 07/16/2016  . Anxiety state 07/03/2016  . Neck pain 07/03/2016  . 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 Acid reflux disease 11/27/2014  . Special screening for malignant neoplasms, colon 09/17/2013  . Obesity, unspecified 01/16/2013  . Spinal stenosis in cervical region 01/11/2013  . Multiple thyroid nodules 01/11/2013  . Hand pain 06/16/2012  . Hyperlipidemia 06/16/2012   Willow Ora, PTA, [facility] [address], [place], [place] [address] [phone] 04/27/18, 10:18 PM   Name: Rebecca [name] MRN: [id number] Date of Birth: 11-Aug-1956

## 2018-04-28 ENCOUNTER — Ambulatory Visit: Payer: BC Managed Care – PPO | Admitting: Physical Therapy

## 2018-04-28 ENCOUNTER — Encounter: Payer: Self-pay | Admitting: Physical Therapy

## 2018-04-28 DIAGNOSIS — M542 Cervicalgia: Secondary | ICD-10-CM

## 2018-04-28 DIAGNOSIS — R42 Dizziness and giddiness: Secondary | ICD-10-CM

## 2018-04-28 DIAGNOSIS — R262 Difficulty in walking, not elsewhere classified: Secondary | ICD-10-CM

## 2018-04-28 NOTE — Patient Instructions (Signed)
Access Code: [email or web address]  URL: [email or web address]  Date: 04/28/2018  Prepared by: Misty Stanley   Exercises  Seated Gaze Stabilization with Head Rotation - 3 reps - 2x daily - 7x weekly  Seated Horizontal Saccades - 10 reps - 2 sets - 2x daily - 7x weekly  Wide Stance with Eyes Closed on Foam Pad - 3 reps - 30 hold - 1x daily - 5x weekly  Seated Levator Scapulae Stretch - 3 reps - 30 hold - 2x daily - 7x weekly  Seated Cervical Sidebending Stretch - 3 reps - 30 hold - 2x daily - 7x weekly  Walking with Head Rotation - 10 reps - 2 sets - 2x daily - 7x weekly  Walking March - 10 reps - 2 sets - 2x daily - 7x weekly  Forward and Backward Walking with Eyes Closed and Counter Support - 10 reps - 2 sets - 2x daily - 7x weekly

## 2018-04-28 NOTE — Therapy (Signed)
[facility] [address] [place], [place], [address] Phone: [phone]   Fax:  [phone]  Physical Therapy Treatment  Patient Details  Name: Rebecca [name] MRN: [id number] Date of Birth: [birth year] Referring Provider: Rosemarie Ax, MD   Encounter Date: 04/28/2018  PT End of Session - 04/28/18 0959    Visit Number  5    Number of Visits  17    Date for PT Re-Evaluation  05/30/18    Authorization Type  [employer], [facility]    PT Start Time  0805    PT Stop Time  [phone]    PT Time Calculation (min)  42 min    Activity Tolerance  Patient tolerated treatment well    Behavior During Therapy  [facility] for tasks assessed/performed       Past Medical History:  Diagnosis Date  . Hyperlipidemia   . Obesity   . Peritonsillar abscess 02/03/2017  . Thyroid disease    monitoring a nodule 1/9    Past Surgical History:  Procedure Laterality Date  . ABDOMINAL HYSTERECTOMY      There were no vitals filed for this visit.  Subjective Assessment - 04/28/18 0807    Subjective  "This session snuck up on me, did't realize I had two appointments this week."  Neck is feeling better, headaches are better.  Has been walking in the neighborhood and going to the gym to use the sauna.  LLE pain is better.    Pertinent History  anxiety, obesity, spinal stenosis in cervical region, thyroid nodules, and hyperlipidemia    Limitations  Walking;Other (comment)    Diagnostic tests  CT scan of head and c-spine    Patient Stated Goals  Return to walking (avid walker), relieve L neck pain, return to work    Currently in Pain?  No/denies         [facility] PT Assessment - 04/28/18 0812      Standardized Balance Assessment   Standardized Balance Assessment  10 meter walk test    10 Meter Walk  10.75 seconds or 3.32f/sec      Functional Gait  Assessment   Gait assessed   Yes    Gait Level Surface  Walks 20 ft in less than 7 sec but greater than  5.5 sec, uses assistive device, slower speed, mild gait deviations, or deviates 6-10 in outside of the 12 in walkway width.    Change in Gait Speed  Makes only minor adjustments to walking speed, or accomplishes a change in speed with significant gait deviations, deviates 10-15 in outside the 12 in walkway width, or changes speed but loses balance but is able to recover and continue walking.    Gait with Horizontal Head Turns  Performs head turns smoothly with slight change in gait velocity (eg, minor disruption to smooth gait path), deviates 6-10 in outside 12 in walkway width, or uses an assistive device.    Gait with Vertical Head Turns  Performs task with slight change in gait velocity (eg, minor disruption to smooth gait path), deviates 6 - 10 in outside 12 in walkway width or uses assistive device    Gait and Pivot Turn  Pivot turns safely within 3 sec and stops quickly with no loss of balance.    Step Over Obstacle  Is able to step over one shoe box (4.5 in total height) but must slow down and adjust steps to clear box safely. May require verbal cueing.  Gait with Narrow Base of Support  Is able to ambulate for 10 steps heel to toe with no staggering.    Gait with Eyes Closed  Walks 20 ft, slow speed, abnormal gait pattern, evidence for imbalance, deviates 10-15 in outside 12 in walkway width. Requires more than 9 sec to ambulate 20 ft.    Ambulating Backwards  Walks 20 ft, uses assistive device, slower speed, mild gait deviations, deviates 6-10 in outside 12 in walkway width.    Steps  Alternating feet, no rail.    Total Score  20    FGA comment:  20/30                    Vestibular Treatment/Exercise - 04/28/18 1016      Vestibular Treatment/Exercise   Vestibular Treatment Provided  Gaze    Gaze Exercises  X1 Viewing Horizontal;X1 Viewing Vertical      X1 Viewing Horizontal   Foot Position  seated    Reps  2    Comments  20 seconds, slow movements      X1 Viewing  Vertical   Foot Position  seated    Reps  1    Comments  20 seconds but reported 8/10 symptoms afterwards; will hold on adding to HEP for now      Also perform saccades in sitting horizontal with mild symptoms; vertical saccades increased symptoms to 8/10. Will not add to HEP at this time.   Balance Exercises - 04/28/18 1013      Balance Exercises: Standing   Gait with Head Turns  Forward;2 reps;Intermittent upper extremity support    Marching Limitations  Forwards down counter top with one UE support x 4 reps with cues to pause at the top of the march    Other Standing Exercises  walking forwards and backwards with one UE on counter with eyes closed x 2 reps        PT Education - 04/28/18 0958    Education provided  Yes    Education Details  progress towards STG; updated HEP    Person(s) Educated  Patient    Methods  Explanation;Demonstration;Handout    Comprehension  Verbalized understanding;Returned demonstration      Access Code: [email or web address]  URL: [email or web address]  Date: 04/28/2018  Prepared by: Misty Stanley   Exercises  Seated Gaze Stabilization with Head Rotation - 3 reps - 2x daily - 7x weekly  Seated Horizontal Saccades - 10 reps - 2 sets - 2x daily - 7x weekly  Wide Stance with Eyes Closed on Foam Pad - 3 reps - 30 hold - 1x daily - 5x weekly  Seated Levator Scapulae Stretch - 3 reps - 30 hold - 2x daily - 7x weekly  Seated Cervical Sidebending Stretch - 3 reps - 30 hold - 2x daily - 7x weekly  Walking with Head Rotation - 10 reps - 2 sets - 2x daily - 7x weekly  Walking March - 10 reps - 2 sets - 2x daily - 7x weekly  Forward and Backward Walking with Eyes Closed and Counter Support - 10 reps - 2 sets - 2x daily - 7x weekly    PT Short Term Goals - 04/28/18 0810      PT SHORT TERM GOAL #1   Title  (ALL STG DUE BY 04/30/18)  Pt will participate in further assessment of balance during gait with FGA and will initiate balance HEP    Time  4     Period  Weeks    Status  Achieved      PT SHORT TERM GOAL #2   Title  Pt will participate in full vestibular evaluation and initiate vestibular HEP    Time  4    Period  Weeks    Status  Achieved      PT SHORT TERM GOAL #3   Title  Pt will initiate cervical stretching/strengthening HEP    Time  4    Period  Weeks    Status  Achieved      PT SHORT TERM GOAL #4   Title  Pt will participate in endurance assessment with 6 min walk test and initiate walking program    Baseline  Pt has self-initiated walking program outside in her neighborhood x 20 minutes    Time  4    Period  Weeks    Status  Deferred      PT SHORT TERM GOAL #5   Title  Pt will improve gait velocity to >/= 3.0 ft/sec     Baseline  2.51f/sec > 3.05 ft/sec    Time  4    Period  Weeks    Status  Achieved        PT Long Term Goals - 04/28/18 1007      PT LONG TERM GOAL #1   Title  (ALL LTG DUE BY 05/30/18)  Pt will demonstrate independence with HEP: balance, vestibular, cervical spine and walking program    Time  8    Period  Weeks    Status  New    Target Date  05/30/18      PT LONG TERM GOAL #2   Title  Pt will improve FGA by 4 points to decrease falls risk    Baseline  20/30    Time  8    Period  Weeks    Status  Revised    Target Date  05/30/18      PT LONG TERM GOAL #3   Title  Pt will improve gait velocity to >/= 3.6 ft/sec    Baseline  3.05 ft/sec     Time  8    Period  Weeks    Status  Revised    Target Date  05/30/18      PT LONG TERM GOAL #4   Title  Pt will improve endurance by increasing distance on 6 minute walk test by 150'    Baseline  deferred - pt self initiated walking program    Time  8    Period  Weeks    Status  Deferred      PT LONG TERM GOAL #5   Title  Pt will improve cervical AROM by 10 degrees and report 50% reduction in headache, neck and L shoulder pain    Time  8    Period  Weeks    Status  New    Target Date  05/30/18      PT LONG TERM GOAL #6   Title  Pt  will report 0-1/5 dizziness with head nods and turns on MSQ    Baseline  3/5    Time  8    Period  Weeks    Status  New    Target Date  05/30/18            Plan - 04/28/18 0959    Clinical Impression Statement  Treatment session today focused on assessment of pt progress towards STG.  Pt has met all STG  and demonstrates improvements in cervical pain, cervical ROM, decreased dizziness with movement, improved balance, gait velocity and activity tolerance for walking program.  HEP is ongoing as pt has only been able to tolerate small progressions in cervical spine exercises and vestibular exercises.  Added standing balance exercises today.  Will continue to progress towards LTG.    Rehab Potential  Good    PT Frequency  2x / week    PT Duration  8 weeks    PT Treatment/Interventions  ADLs/Self Care Home Management;Canalith Repostioning;Cryotherapy;Electrical Stimulation;Moist Heat;Traction;Gait training;Functional mobility training;Therapeutic activities;Therapeutic exercise;Balance training;Neuromuscular re-education;Patient/family education;Manual techniques;Passive range of motion;Dry needling;Vestibular    PT Next Visit Plan  continue to address cervical spine manual therapy and HEP, how is she tolerating vestibular HEP?  Add in vertical head movements and eye movements to exercises?, discuss dry needling.    PT Home Exercise Plan  [email or web address]    Consulted and Agree with Plan of Care  Patient       Patient will benefit from skilled therapeutic intervention in order to improve the following deficits and impairments:  Decreased activity tolerance, Decreased balance, Decreased range of motion, Decreased strength, Difficulty walking, Dizziness, Impaired perceived functional ability, Impaired sensation, Pain  Visit Diagnosis: Cervicalgia  Dizziness and giddiness  Difficulty in walking, not elsewhere classified     Problem List Patient Active Problem List   Diagnosis Date Noted  .  Concussion with no loss of consciousness 03/17/2018  . Elevated blood pressure 07/16/2016  . Anxiety state 07/03/2016  . Neck pain 07/03/2016  . 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 Acid reflux disease 11/27/2014  . Special screening for malignant neoplasms, colon 09/17/2013  . Obesity, unspecified 01/16/2013  . Spinal stenosis in cervical region 01/11/2013  . Multiple thyroid nodules 01/11/2013  . Hand pain 06/16/2012  . Hyperlipidemia 06/16/2012    Rico Junker, PT, DPT 04/28/18    10:19 AM    [facility] [address] [place], [place], [address] Phone: [phone]   Fax:  [phone]  Name: Yahaira Bruski MRN: [id number] Date of Birth: 1955/12/28

## 2018-05-03 ENCOUNTER — Encounter: Payer: Self-pay | Admitting: Rehabilitative and Restorative Service Providers"

## 2018-05-03 ENCOUNTER — Ambulatory Visit: Payer: BC Managed Care – PPO | Admitting: Rehabilitative and Restorative Service Providers"

## 2018-05-03 DIAGNOSIS — R42 Dizziness and giddiness: Secondary | ICD-10-CM

## 2018-05-03 DIAGNOSIS — M542 Cervicalgia: Secondary | ICD-10-CM

## 2018-05-03 DIAGNOSIS — R262 Difficulty in walking, not elsewhere classified: Secondary | ICD-10-CM

## 2018-05-03 NOTE — Therapy (Signed)
[facility] [address] [place], [place], [address] Phone: [phone]   Fax:  [phone]  Physical Therapy Treatment  Patient Details  Name: Rebecca [name] MRN: [id number] Date of Birth: [birth year] Referring Provider: Rosemarie Ax, MD   Encounter Date: 05/03/2018  PT End of Session - 05/03/18 1129    Visit Number  6    Number of Visits  17    Date for PT Re-Evaluation  05/30/18    Authorization Type  [employer], [facility]    PT Start Time  [phone]    PT Stop Time  [phone]    PT Time Calculation (min)  39 min    Activity Tolerance  Patient tolerated treatment well    Behavior During Therapy  [facility] for tasks assessed/performed       Past Medical History:  Diagnosis Date  . Hyperlipidemia   . Obesity   . Peritonsillar abscess 02/03/2017  . Thyroid disease    monitoring a nodule 1/9    Past Surgical History:  Procedure Laterality Date  . ABDOMINAL HYSTERECTOMY      There were no vitals filed for this visit.  Subjective Assessment - 05/03/18 0855    Subjective  The exercises are going well.  The patient notes fatigue leads to greater pain.  She notes she is walking regularly except she is used to power walking and she isn't able to do that yet.    Pertinent History  anxiety, obesity, spinal stenosis in cervical region, thyroid nodules, and hyperlipidemia    Patient Stated Goals  Return to walking (avid walker), relieve L neck pain, return to work    Currently in Pain?  Yes    Pain Score  7     Pain Location  Neck    Pain Orientation  Left    Pain Descriptors / Indicators  Aching;Spasm    Pain Radiating Towards  down left arm    Pain Onset  1 to 4 weeks ago    Pain Frequency  Constant    Aggravating Factors   activity    Pain Relieving Factors  rest         [facility] PT Assessment - 05/03/18 0001      AROM   Cervical Flexion  30    Cervical Extension  20    Cervical - Right Rotation  40 wih pain on the left  side    Cervical - Left Rotation  40 before pain begins, 52 through pain                   [facility] Adult PT Treatment/Exercise - 05/03/18 0857      Self-Care   Self-Care  Other Self-Care Comments    Other Self-Care Comments   left leg is getting better with walking; "when I turn my head, I have to turn my body"        Exercises   Exercises  Neck    Other Exercises   --      Neck Exercises: Standing   Other Standing Exercises  Door frame neural glide with L UE 90 degrees abducted adding wrist extension to door frame/right lateral neck flexion and r neck rotation.  Repeated 2 times each side.       Neck Exercises: Seated   W Back  10 reps    W Back Weights (lbs)  with arms at neutral with emphasis on upper back scapular retraction    Shoulder [employer]  reps    Shoulder ABduction  10 reps    Shoulder Abduction Weights (lbs)  no weight      Neck Exercises: Supine   Neck Retraction  5 reps;5 secs    Neck Retraction Limitations  With tactile cues and verbal cues for correct technique      Manual Therapy   Manual Therapy  Joint mobilization;Soft tissue mobilization;Myofascial release;Muscle Energy Technique;Neural Stretch;Manual Traction;Passive ROM    Manual therapy comments  Manual techniques to reduce muscle guarding, improve ROM, decrease pain    Joint Mobilization  mid cervical lateral glides in supine and in sidelying    Soft tissue mobilization  bilateral upper trapezius, L mid cervical ligamentous STM, L scalenes    Myofascial Release  suboccipital release supine    Passive ROM  with overpressure into rotation and contract/relax at end range    Manual Traction  gentle manual traction; patient guards during traction    Muscle Energy Technique  contract/relax at end range    Neural Stretch  with L shoulder abduction adding wrist flexion/extension with neck rotation               PT Short Term Goals - 04/28/18 0810      PT SHORT TERM GOAL #1    Title  (ALL STG DUE BY 04/30/18)  Pt will participate in further assessment of balance during gait with FGA and will initiate balance HEP    Time  4    Period  Weeks    Status  Achieved      PT SHORT TERM GOAL #2   Title  Pt will participate in full vestibular evaluation and initiate vestibular HEP    Time  4    Period  Weeks    Status  Achieved      PT SHORT TERM GOAL #3   Title  Pt will initiate cervical stretching/strengthening HEP    Time  4    Period  Weeks    Status  Achieved      PT SHORT TERM GOAL #4   Title  Pt will participate in endurance assessment with 6 min walk test and initiate walking program    Baseline  Pt has self-initiated walking program outside in her neighborhood x 20 minutes    Time  4    Period  Weeks    Status  Deferred      PT SHORT TERM GOAL #5   Title  Pt will improve gait velocity to >/= 3.0 ft/sec     Baseline  2.68ft/sec > 3.05 ft/sec    Time  4    Period  Weeks    Status  Achieved        PT Long Term Goals - 04/28/18 1007      PT LONG TERM GOAL #1   Title  (ALL LTG DUE BY 05/30/18)  Pt will demonstrate independence with HEP: balance, vestibular, cervical spine and walking program    Time  8    Period  Weeks    Status  New    Target Date  05/30/18      PT LONG TERM GOAL #2   Title  Pt will improve FGA by 4 points to decrease falls risk    Baseline  20/30    Time  8    Period  Weeks    Status  Revised    Target Date  05/30/18      PT LONG TERM GOAL #3   Title  Pt will improve gait velocity to >/= 3.6 ft/sec    Baseline  3.05 ft/sec     Time  8    Period  Weeks    Status  Revised    Target Date  05/30/18      PT LONG TERM GOAL #4   Title  Pt will improve endurance by increasing distance on 6 minute walk test by 150'    Baseline  deferred - pt self initiated walking program    Time  8    Period  Weeks    Status  Deferred      PT LONG TERM GOAL #5   Title  Pt will improve cervical AROM by 10 degrees and report 50% reduction  in headache, neck and L shoulder pain    Time  8    Period  Weeks    Status  New    Target Date  05/30/18      PT LONG TERM GOAL #6   Title  Pt will report 0-1/5 dizziness with head nods and turns on MSQ    Baseline  3/5    Time  8    Period  Weeks    Status  New    Target Date  05/30/18            Plan - 05/03/18 1136    Clinical Impression Statement  The patient began session at 7/10 and ended with 5/10.  She initially reports neck guarded due to fear of dizziness, however when asked to perform sitting rotation, she guards due to neck pain per report.  PT to emphasize working on neckROM/ pain mgmt in order to further address motion sensitivity.    PT Treatment/Interventions  ADLs/Self Care Home Management;Canalith Repostioning;Cryotherapy;Electrical Stimulation;Moist Heat;Traction;Gait training;Functional mobility training;Therapeutic activities;Therapeutic exercise;Balance training;Neuromuscular re-education;Patient/family education;Manual techniques;Passive range of motion;Dry needling;Vestibular    PT Next Visit Plan  cervical spine ROM, strengthening, manual therapy; check on vestibular HEP and consider vertical head motion and eye movements for HEP; discuss dry needling    PT Home Exercise Plan  [id number]    Consulted and Agree with Plan of Care  Patient       Patient will benefit from skilled therapeutic intervention in order to improve the following deficits and impairments:  Decreased activity tolerance, Decreased balance, Decreased range of motion, Decreased strength, Difficulty walking, Dizziness, Impaired perceived functional ability, Impaired sensation, Pain  Visit Diagnosis: Cervicalgia  Dizziness and giddiness  Difficulty in walking, not elsewhere classified     Problem List Patient Active Problem List   Diagnosis Date Noted  . Concussion with no loss of consciousness 03/17/2018  . Elevated blood pressure 07/16/2016  . Anxiety state 07/03/2016  . Neck  pain 07/03/2016  . 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 Acid reflux disease 11/27/2014  . Special screening for malignant neoplasms, colon 09/17/2013  . Obesity, unspecified 01/16/2013  . Spinal stenosis in cervical region 01/11/2013  . Multiple thyroid nodules 01/11/2013  . Hand pain 06/16/2012  . Hyperlipidemia 06/16/2012    Zarria Towell, PT 05/03/2018, 11:39 AM  [facility] [address] [place], [place], [address] Phone: [phone]   Fax:  [phone]  Name: Rebecca [name] MRN: [id number] Date of Birth: [birth year]

## 2018-05-04 ENCOUNTER — Ambulatory Visit (INDEPENDENT_AMBULATORY_CARE_PROVIDER_SITE_OTHER): Payer: BC Managed Care – PPO | Admitting: Family Medicine

## 2018-05-04 ENCOUNTER — Encounter: Payer: Self-pay | Admitting: Family Medicine

## 2018-05-04 DIAGNOSIS — S060X0D Concussion without loss of consciousness, subsequent encounter: Secondary | ICD-10-CM

## 2018-05-04 NOTE — Progress Notes (Signed)
Rebecca [name] - 62 y.o. female MRN [id number]  Date of birth: 25-Dec-1955  SUBJECTIVE:  Including CC & ROS.  Chief Complaint  Patient presents with  . Follow-up    Rebecca [name] is a 62 y.o. female that is here today for concussion follow up. She has been in Physical therapy twice a week, her symptoms have improved. Her headaches have been less frequent. Dizziness has improved. Ongoing sleep difficulties.  She has trip planned in August to [place].    Review of Systems  Constitutional: Negative for fever.  HENT: Negative for congestion.   Respiratory: Negative for cough.   Musculoskeletal: Negative for gait problem.  Skin: Negative for color change.  Neurological: Positive for dizziness and headaches.  Hematological: Negative for adenopathy.  Psychiatric/Behavioral: Negative for agitation.    HISTORY: Past Medical, Surgical, Social, and Family History Reviewed & Updated per EMR.   Pertinent Historical Findings include:  Past Medical History:  Diagnosis Date  . Hyperlipidemia   . Obesity   . Peritonsillar abscess 02/03/2017  . Thyroid disease    monitoring a nodule 1/9    Past Surgical History:  Procedure Laterality Date  . ABDOMINAL HYSTERECTOMY      Allergies  Allergen Reactions  . Prednisolone Other (See Comments)    Patient can't remember  Other reaction(s): Delusions (intolerance) Patient can't remember     Family History  Problem Relation Age of Onset  . Cancer Mother        pancreatic cancer  . Diabetes Mother   . Heart disease Mother        rheumatic heart disease  . Hyperlipidemia Mother   . COPD Sister   . Sleep apnea Sister   . Cancer Sister        Lung Cancer  . Cancer Brother        lung cancer  . Cancer Father        renal, bone  . Diabetes Father   . Hyperlipidemia Father   . Heart attack Maternal Grandfather   . Kidney disease Brother   . Colon cancer Maternal Aunt      Social History   Socioeconomic History  . Marital  status: Single    Spouse name: N/A  . Number of children: 0  . Years of education: 18.5  . Highest education level: Not on file  Occupational History  . Occupation: Retired    Fish farm manager: Psychologist, sport and exercise    Comment: [employer] x 30 years  . 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 Financial resource strain: Not on file  . Food insecurity:    Worry: Not on file    Inability: Not on file  . Transportation needs:    Medical: Not on file    Non-medical: Not on file  Tobacco Use  . Smoking status: Never Smoker  . Smokeless tobacco: Never Used  Substance and Sexual Activity  . Alcohol use: Yes    Alcohol/week: 3.0 - 4.2 oz    Types: 5 - 7 Standard drinks or equivalent per week    Comment: 4-5 * week/ 1-2 drinks  . Drug use: No  . Sexual activity: Yes    Partners: Male    Birth control/protection: Surgical  Lifestyle  . Physical activity:    Days per week: Not on file    Minutes per session: Not on file  . Stress: Not on file  Relationships  . Social connections:  Talks on phone: Not on file    Gets together: Not on file    Attends religious service: Not on file    Active member of club or organization: Not on file    Attends meetings of clubs or organizations: Not on file    Relationship status: Not on file  . Intimate partner violence:    Fear of current or ex partner: Not on file    Emotionally abused: Not on file    Physically abused: Not on file    Forced sexual activity: Not on file  Other Topics Concern  . Not on file  Social History Narrative   Close friend/significant other killed (GSW) 2012.   Retired after 30 years, and took another job (commutes to [place] 4 days/week).     PHYSICAL EXAM:  VS: BP 134/76 (BP Location: Left Arm, Patient Position: Sitting, Cuff Size: Normal)   Pulse 76   Ht 5' 4.2" (1.631 m)   Wt 193 lb (87.5 kg)   SpO2 97%   BMI 32.92 kg/m  Physical Exam Gen: NAD,  alert, cooperative with exam, well-appearing ENT: normal lips, normal nasal mucosa,  Eye: normal EOM, normal conjunctiva and lids CV:  no edema, +2 pedal pulses   Resp: no accessory muscle use, non-labored,  Skin: no rashes, no areas of induration  Neuro: normal tone, normal sensation to touch Psych:  normal insight, alert and oriented MSK: normal gait, normal strength      ASSESSMENT & PLAN:   Concussion with no loss of consciousness Having some improvement with her symptoms. Has been going to PT. Is taking the nortriptyline every other day.  - continue current medication - continue PT  - f/u in 2-3 weeks  - will fill out the paperwork for work.

## 2018-05-04 NOTE — Assessment & Plan Note (Signed)
Having some improvement with her symptoms. Has been going to PT. Is taking the nortriptyline every other day.  - continue current medication - continue PT  - f/u in 2-3 weeks  - will fill out the paperwork for work.

## 2018-05-04 NOTE — Patient Instructions (Signed)
Good to see you  Please follow up with me in 2-3 weeks  i'll try to get your paperwork done tomorrow and give ya a call

## 2018-05-05 ENCOUNTER — Encounter: Payer: Self-pay | Admitting: Physical Therapy

## 2018-05-05 ENCOUNTER — Ambulatory Visit: Payer: BC Managed Care – PPO | Admitting: Physical Therapy

## 2018-05-05 ENCOUNTER — Telehealth: Payer: Self-pay | Admitting: *Deleted

## 2018-05-05 DIAGNOSIS — R635 Abnormal weight gain: Secondary | ICD-10-CM | POA: Diagnosis not present

## 2018-05-05 DIAGNOSIS — M542 Cervicalgia: Secondary | ICD-10-CM | POA: Diagnosis not present

## 2018-05-05 DIAGNOSIS — R7303 Prediabetes: Secondary | ICD-10-CM | POA: Diagnosis not present

## 2018-05-05 DIAGNOSIS — R262 Difficulty in walking, not elsewhere classified: Secondary | ICD-10-CM

## 2018-05-05 DIAGNOSIS — R42 Dizziness and giddiness: Secondary | ICD-10-CM

## 2018-05-05 NOTE — Therapy (Signed)
[facility] [address] [place], [place], [address] Phone: [phone]   Fax:  [phone]  Physical Therapy Treatment  Patient Details  Name: Rebecca [name] MRN: [id number] Date of Birth: [birth year] Referring Provider: Rosemarie Ax, MD   Encounter Date: 05/05/2018  PT End of Session - 05/05/18 0936    Visit Number  7    Number of Visits  17    Date for PT Re-Evaluation  05/30/18    Authorization Type  [employer], [employer]    PT Start Time  [phone]    PT Stop Time  0930    PT Time Calculation (min)  42 min    Activity Tolerance  Patient tolerated treatment well;No increased pain    Behavior During Therapy  WFL for tasks assessed/performed       Past Medical History:  Diagnosis Date  . Hyperlipidemia   . Obesity   . Peritonsillar abscess 02/03/2017  . Thyroid disease    monitoring a nodule 1/9    Past Surgical History:  Procedure Laterality Date  . ABDOMINAL HYSTERECTOMY      There were no vitals filed for this visit.  Subjective Assessment - 05/05/18 0854    Subjective  The patient notes fatigue leads to greater pain.  Pt. states that her pain is a little bit better today and that the Left side of her neck bothers her more than the Right.    Pertinent History  anxiety, obesity, spinal stenosis in cervical region, thyroid nodules, and hyperlipidemia    Limitations  Walking;Standing    Patient Stated Goals  Return to walking (avid walker), relieve L neck pain, return to work    Currently in Pain?  Yes    Pain Score  6     Pain Location  Neck    Pain Descriptors / Indicators  Aching    Pain Type  Acute pain    Pain Onset  1 to 4 weeks ago    Aggravating Factors   Fatigue, activity (certain motions)    Pain Relieving Factors  Rest    Multiple Pain Sites  No        [facility] Adult PT Treatment/Exercise - 05/05/18 0940      Neuro Re-ed    Neuro Re-ed Details   Levator Scapula stretch, B, 3x, 30sec, pt.  unable to hold for full stretch due to pain, but continued after brief break      Manual Therapy   Manual therapy comments  Manual techniques to reduce muscle guarding, improve ROM, decrease pain    Passive ROM  with overpressure into rotation        05/05/18 0940  Manual Therapy  Manual Therapy Soft tissue mobilization;Myofascial release;Muscle Energy Technique;Neural Stretch;Manual Traction;Passive ROM  Manual therapy comments Manual techniques to reduce muscle guarding, improve ROM, decrease pain  Soft tissue mobilization bilateral upper trapezius, L mid cervical ligamentous STM, L scalenes  Myofascial Release suboccipital release supine  Passive ROM with overpressure into rotation to enhance stretching  Manual Traction suboccipital release for 30 sec holds x 5 reps; gentle manual traction for up to 1 minutes holds x 3 reps, with pt guarding, decreased over time held   Muscle Energy Technique gentle cervical distraction concurrent wtih scapular retractions x 10 reps, then with scapular depression x 10 reps.   Neural Stretch concurrent with gentle cervical distraction gentle overpressure to shoulders for scapular depression to enhance stretching for 30 sec holds x 3 each side.  PT Education - 05/05/18 0935    Education Details  Pt. was instructed in Levator Scapula stretch for relief of pain and encouraged to explore dry needling.    Person(s) Educated  Patient    Methods  Explanation;Demonstration    Comprehension  Verbalized understanding;Returned demonstration       PT Short Term Goals - 04/28/18 0810      PT SHORT TERM GOAL #1   Title  (ALL STG DUE BY 04/30/18)  Pt will participate in further assessment of balance during gait with FGA and will initiate balance HEP    Time  4    Period  Weeks    Status  Achieved      PT SHORT TERM GOAL #2   Title  Pt will participate in full vestibular evaluation and initiate vestibular HEP    Time  4    Period  Weeks    Status   Achieved      PT SHORT TERM GOAL #3   Title  Pt will initiate cervical stretching/strengthening HEP    Time  4    Period  Weeks    Status  Achieved      PT SHORT TERM GOAL #4   Title  Pt will participate in endurance assessment with 6 min walk test and initiate walking program    Baseline  Pt has self-initiated walking program outside in her neighborhood x 20 minutes    Time  4    Period  Weeks    Status  Deferred      PT SHORT TERM GOAL #5   Title  Pt will improve gait velocity to >/= 3.0 ft/sec     Baseline  2.31ft/sec > 3.05 ft/sec    Time  4    Period  Weeks    Status  Achieved        PT Long Term Goals - 04/28/18 1007      PT LONG TERM GOAL #1   Title  (ALL LTG DUE BY 05/30/18)  Pt will demonstrate independence with HEP: balance, vestibular, cervical spine and walking program    Time  8    Period  Weeks    Status  New    Target Date  05/30/18      PT LONG TERM GOAL #2   Title  Pt will improve FGA by 4 points to decrease falls risk    Baseline  20/30    Time  8    Period  Weeks    Status  Revised    Target Date  05/30/18      PT LONG TERM GOAL #3   Title  Pt will improve gait velocity to >/= 3.6 ft/sec    Baseline  3.05 ft/sec     Time  8    Period  Weeks    Status  Revised    Target Date  05/30/18      PT LONG TERM GOAL #4   Title  Pt will improve endurance by increasing distance on 6 minute walk test by 150'    Baseline  deferred - pt self initiated walking program    Time  8    Period  Weeks    Status  Deferred      PT LONG TERM GOAL #5   Title  Pt will improve cervical AROM by 10 degrees and report 50% reduction in headache, neck and L shoulder pain    Time  8    Period  Weeks    Status  New    Target Date  05/30/18      PT LONG TERM GOAL #6   Title  Pt will report 0-1/5 dizziness with head nods and turns on MSQ    Baseline  3/5    Time  8    Period  Weeks    Status  New    Target Date  05/30/18        Plan - 05/05/18 8250    Clinical  Impression Statement  Today's session focused on manual therapy with the goal of decreasing the pt's pain. Manual therapy was successful and pt. would benefit from continued PT to address pain management and vestibular deficits.    PT Frequency  2x / week    PT Duration  8 weeks    PT Treatment/Interventions  ADLs/Self Care Home Management;Canalith Repostioning;Cryotherapy;Electrical Stimulation;Moist Heat;Traction;Gait training;Functional mobility training;Therapeutic activities;Therapeutic exercise;Balance training;Neuromuscular re-education;Patient/family education;Manual techniques;Passive range of motion;Dry needling;Vestibular    PT Next Visit Plan  cervical spine ROM, strengthening, manual therapy; check on vestibular HEP and consider vertical head motion and eye movements for HEP; discuss dry needling    PT Home Exercise Plan  [id number]    Consulted and Agree with Plan of Care  Patient       Patient will benefit from skilled therapeutic intervention in order to improve the following deficits and impairments:  Decreased activity tolerance, Decreased balance, Decreased range of motion, Decreased strength, Difficulty walking, Dizziness, Impaired perceived functional ability, Impaired sensation, Pain  Visit Diagnosis: Cervicalgia  Dizziness and giddiness  Difficulty in walking, not elsewhere classified     Problem List Patient Active Problem List   Diagnosis Date Noted  . Concussion with no loss of consciousness 03/17/2018  . Elevated blood pressure 07/16/2016  . Anxiety state 07/03/2016  . Neck pain 07/03/2016  . 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 Acid reflux disease 11/27/2014  . Special screening for malignant neoplasms, colon 09/17/2013  . Obesity, unspecified 01/16/2013  . Spinal stenosis in cervical region 01/11/2013  . Multiple thyroid nodules 01/11/2013  . Hand pain 06/16/2012  . Hyperlipidemia 06/16/2012    Arthor Captain, SPTA 05/05/2018, 9:43 AM  [facility] [address] [place], [place], [address] Phone: [phone]   Fax:  [phone]  Name: Mahathi Pokorney MRN: [id number] Date of Birth: [birth year]  This note has been reviewed and edited by supervising CI. Added full details of manual therapy performed for decreased pain, increased ROM and decreased muscle tightness. Again provided pt information on benefits of dry needling. Pt agreed to think about it.   Willow Ora, PTA, [facility] [address] [place], [place] [address] [phone] 05/07/18, 1:40 PM

## 2018-05-05 NOTE — Telephone Encounter (Signed)
Attempted to leave a voicemail, no voice mail setup. Will attempt to leave message at later time.   Paperwork has not been completed as of yet, will give her phone call when completed.    Copied from [facility] [id number]. Topic: General - Other >> May 05, 2018  2:46 PM Judyann Munson wrote: Reason for CRM: Patient is calling back in regards to  requesting paperwork for her job to be filled out by Dr. Raeford Razor.  CB# [phone]. Please advise

## 2018-05-09 ENCOUNTER — Ambulatory Visit: Payer: BC Managed Care – PPO | Attending: Family Medicine | Admitting: Physical Therapy

## 2018-05-09 ENCOUNTER — Encounter: Payer: Self-pay | Admitting: Physical Therapy

## 2018-05-09 DIAGNOSIS — R262 Difficulty in walking, not elsewhere classified: Secondary | ICD-10-CM | POA: Diagnosis not present

## 2018-05-09 DIAGNOSIS — R42 Dizziness and giddiness: Secondary | ICD-10-CM | POA: Diagnosis not present

## 2018-05-09 DIAGNOSIS — M542 Cervicalgia: Secondary | ICD-10-CM

## 2018-05-09 NOTE — Telephone Encounter (Signed)
Pt calling back to advise this paperwork  is urgent and needs a call back asap

## 2018-05-09 NOTE — Therapy (Signed)
[facility] [address] [place], [place], [address] Phone: [phone]   Fax:  [phone]  Physical Therapy Treatment  Patient Details  Name: Rebecca [name] MRN: [id number] Date of Birth: 1956/03/19 Referring Provider: Rosemarie Ax, MD   Encounter Date: 05/09/2018  PT End of Session - 05/09/18 0932    Visit Number  8    Number of Visits  17    Date for PT Re-Evaluation  05/30/18    Authorization Type  [employer], [employer]    PT Start Time  [phone]    PT Stop Time  0930    PT Time Calculation (min)  44 min    Activity Tolerance  Patient tolerated treatment well;No increased pain    Behavior During Therapy  WFL for tasks assessed/performed       Past Medical History:  Diagnosis Date  . Hyperlipidemia   . Obesity   . Peritonsillar abscess 02/03/2017  . Thyroid disease    monitoring a nodule 1/9    Past Surgical History:  Procedure Laterality Date  . ABDOMINAL HYSTERECTOMY      There were no vitals filed for this visit.  Subjective Assessment - 05/09/18 0851    Subjective  The patient notes fatigue leads to greater pain.  Pt. states that her pain is a little bit better today and that the Left side of her neck bothers her more than the Right. She enjoyed a relaxing weekend and states that she is trying to avoid any activity that could increase her pain.    Pertinent History  anxiety, obesity, spinal stenosis in cervical region, thyroid nodules, and hyperlipidemia    Limitations  Walking;Standing    Patient Stated Goals  Return to walking (avid walker), relieve L neck pain, return to work    Currently in Pain?  Yes    Pain Score  5     Pain Location  Neck    Pain Orientation  Left    Pain Descriptors / Indicators  Aching    Pain Type  Acute pain    Pain Onset  1 to 4 weeks ago    Pain Frequency  Constant    Aggravating Factors   Fatigue, activity    Pain Relieving Factors  Rest    Multiple Pain Sites  No         [facility] Adult PT Treatment/Exercise - 05/09/18 0935      Neuro Re-ed    Neuro Re-ed Details   Levator Scapula, upper trap,  and scalenes stretches, bil, 2x, 20sec, pt. able to hold for full stretch length today and demonstrated improved tolerance of cervical motion following manual therapy      Manual Therapy   Manual Therapy  Soft tissue mobilization;Myofascial release;Muscle Energy Technique;Neural Stretch;Manual Traction;Passive ROM    Manual therapy comments  Manual techniques to reduce muscle guarding, improve ROM, decrease pain    Soft tissue mobilization  bilateral upper trapezius, L mid cervical ligamentous STM, L scalenes    Manual Traction  gentle manual traction for up to 1 minutes holds x 3 reps, with pt guarding, decreased over time held     Muscle Energy Technique  gentle cervical distraction concurrent with scapular depression 3 sets x 10 reps.     Neural Stretch  concurrent with gentle cervical distraction gentle overpressure to shoulders for scapular depression to enhance stretching for 30 sec holds x 3 each side.         PT Short Term  Goals - 04/28/18 0810      PT SHORT TERM GOAL #1   Title  (ALL STG DUE BY 04/30/18)  Pt will participate in further assessment of balance during gait with FGA and will initiate balance HEP    Time  4    Period  Weeks    Status  Achieved      PT SHORT TERM GOAL #2   Title  Pt will participate in full vestibular evaluation and initiate vestibular HEP    Time  4    Period  Weeks    Status  Achieved      PT SHORT TERM GOAL #3   Title  Pt will initiate cervical stretching/strengthening HEP    Time  4    Period  Weeks    Status  Achieved      PT SHORT TERM GOAL #4   Title  Pt will participate in endurance assessment with 6 min walk test and initiate walking program    Baseline  Pt has self-initiated walking program outside in her neighborhood x 20 minutes    Time  4    Period  Weeks    Status  Deferred      PT SHORT TERM  GOAL #5   Title  Pt will improve gait velocity to >/= 3.0 ft/sec     Baseline  2.67ft/sec > 3.05 ft/sec    Time  4    Period  Weeks    Status  Achieved        PT Long Term Goals - 04/28/18 1007      PT LONG TERM GOAL #1   Title  (ALL LTG DUE BY 05/30/18)  Pt will demonstrate independence with HEP: balance, vestibular, cervical spine and walking program    Time  8    Period  Weeks    Status  New    Target Date  05/30/18      PT LONG TERM GOAL #2   Title  Pt will improve FGA by 4 points to decrease falls risk    Baseline  20/30    Time  8    Period  Weeks    Status  Revised    Target Date  05/30/18      PT LONG TERM GOAL #3   Title  Pt will improve gait velocity to >/= 3.6 ft/sec    Baseline  3.05 ft/sec     Time  8    Period  Weeks    Status  Revised    Target Date  05/30/18      PT LONG TERM GOAL #4   Title  Pt will improve endurance by increasing distance on 6 minute walk test by 150'    Baseline  deferred - pt self initiated walking program    Time  8    Period  Weeks    Status  Deferred      PT LONG TERM GOAL #5   Title  Pt will improve cervical AROM by 10 degrees and report 50% reduction in headache, neck and L shoulder pain    Time  8    Period  Weeks    Status  New    Target Date  05/30/18      PT LONG TERM GOAL #6   Title  Pt will report 0-1/5 dizziness with head nods and turns on MSQ    Baseline  3/5    Time  8    Period  Weeks  Status  New    Target Date  05/30/18        Plan - 05/09/18 0932    Clinical Impression Statement  Today's session focused on manual therapy with the goal of decreasing the pt's pain. Manual therapy was successful demonstrated by pt's report that she did not experience a headache today. She would benefit from continued PT to address pain management and vestibular deficits.    PT Frequency  2x / week    PT Duration  8 weeks    PT Treatment/Interventions  ADLs/Self Care Home Management;Canalith  Repostioning;Cryotherapy;Electrical Stimulation;Moist Heat;Traction;Gait training;Functional mobility training;Therapeutic activities;Therapeutic exercise;Balance training;Neuromuscular re-education;Patient/family education;Manual techniques;Passive range of motion;Dry needling;Vestibular    PT Next Visit Plan  cervical spine ROM, strengthening, manual therapy; check on vestibular HEP and consider vertical head motion and eye movements for HEP; discuss dry needling    PT Home Exercise Plan  [email or web address]    Consulted and Agree with Plan of Care  Patient       Patient will benefit from skilled therapeutic intervention in order to improve the following deficits and impairments:  Decreased activity tolerance, Decreased balance, Decreased range of motion, Decreased strength, Difficulty walking, Dizziness, Impaired perceived functional ability, Impaired sensation, Pain  Visit Diagnosis: Cervicalgia  Dizziness and giddiness  Difficulty in walking, not elsewhere classified     Problem List Patient Active Problem List   Diagnosis Date Noted  . Concussion with no loss of consciousness 03/17/2018  . Elevated blood pressure 07/16/2016  . Anxiety state 07/03/2016  . Neck pain 07/03/2016  . 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 Acid reflux disease 11/27/2014  . Special screening for malignant neoplasms, colon 09/17/2013  . Obesity, unspecified 01/16/2013  . Spinal stenosis in cervical region 01/11/2013  . Multiple thyroid nodules 01/11/2013  . Hand pain 06/16/2012  . Hyperlipidemia 06/16/2012    Arthor Captain, SPTA 05/09/2018, 12:03 PM  [facility] [address] [place], [place], [address] Phone: [phone]   Fax:  [phone]  Name: Rebecca [name] MRN: [id number] Date of Birth: [birth year]

## 2018-05-10 NOTE — Telephone Encounter (Signed)
Left patient a voicemail to return call.

## 2018-05-10 NOTE — Telephone Encounter (Signed)
Faxed paperwork, will keep a copy at [facility] for patient to pick up. Patient aware.

## 2018-05-12 ENCOUNTER — Ambulatory Visit: Payer: BC Managed Care – PPO | Admitting: Physical Therapy

## 2018-05-12 ENCOUNTER — Encounter: Payer: Self-pay | Admitting: Physical Therapy

## 2018-05-12 DIAGNOSIS — R262 Difficulty in walking, not elsewhere classified: Secondary | ICD-10-CM

## 2018-05-12 DIAGNOSIS — M542 Cervicalgia: Secondary | ICD-10-CM

## 2018-05-12 DIAGNOSIS — R42 Dizziness and giddiness: Secondary | ICD-10-CM

## 2018-05-12 NOTE — Therapy (Signed)
[facility] [address] [place], [place], [address] Phone: [phone]   Fax:  [phone]  Physical Therapy Treatment  Patient Details  Name: Rebecca [name] MRN: [id number] Date of Birth: [birth year] Referring Provider: Rosemarie Ax, MD   Encounter Date: 05/12/2018  PT End of Session - 05/12/18 1014    Visit Number  9    Number of Visits  17    Date for PT Re-Evaluation  05/30/18    Authorization Type  [employer], [facility]    PT Start Time  [phone]    PT Stop Time  1025    PT Time Calculation (min)  48 min    Activity Tolerance  Patient tolerated treatment well;No increased pain    Behavior During Therapy  WFL for tasks assessed/performed       Past Medical History:  Diagnosis Date  . Hyperlipidemia   . Obesity   . Peritonsillar abscess 02/03/2017  . Thyroid disease    monitoring a nodule 1/9    Past Surgical History:  Procedure Laterality Date  . ABDOMINAL HYSTERECTOMY      There were no vitals filed for this visit.  Subjective Assessment - 05/12/18 0936    Subjective  Pt. states that her pain is steadily improving that she has been going to the sauna at her gym because the heat has been helping relax the muscles and decrease her pain.     Pertinent History  anxiety, obesity, spinal stenosis in cervical region, thyroid nodules, and hyperlipidemia    Limitations  Walking;Standing    Patient Stated Goals  Return to walking (avid walker), relieve L neck pain, return to work    Currently in Pain?  Yes    Pain Score  4     Pain Location  Neck    Pain Orientation  Left    Pain Descriptors / Indicators  Aching    Pain Type  Acute pain    Pain Onset  1 to 4 weeks ago    Aggravating Factors   Fatigue, activity    Pain Relieving Factors  Rest, heat    Multiple Pain Sites  No        [facility] Adult PT Treatment/Exercise - 05/12/18 1029      Neuro Re-ed    Neuro Re-ed Details   Active upper trap stretch bil 2  reps 30 sec with pain evident, VCs to remain upright with no lateral trunk lean or shoulder shrugging; Levator scapula stretch 2 reps bil 20 sec; seated in chair pectoralis stretch with towel between scapulae and hands behind head pressing elbows back; corner balance exercise wide BOS EO 30 sec, no sway, progressed to EC no increase in sway, narrowed BOS EO, very slight postural sway, progresed to EC, increase in postural sway, addition of head turns side <> side & up <> down noticeable increase in postural sway but able to maintain balance and patient reports presence of minimal dizziness All balance activities were min guard due to pt's ability to correct her balance on her own.      Moist Heat Therapy   Number Minutes Moist Heat  10 Minutes concurrent with E-stim   Moist Heat Location  Cervical      Electrical Stimulation   Electrical Stimulation Location  left & right cervical/scapular area, 10 min, concurrent with moist heat therapy   Electrical Stimulation Action  for decreased pain    Electrical Stimulation Parameters  IFC with intensity to tolerance  Electrical Stimulation Goals  Pain      Manual Therapy   Manual Therapy  Soft tissue mobilization;Myofascial release;Muscle Energy Technique;Neural Stretch;Manual Traction;Passive ROM    Manual therapy comments  Manual techniques to reduce muscle guarding, improve ROM, decrease pain    Soft tissue mobilization  bilateral upper trapezius, L mid cervical ligamentous STM, L scalenes    Muscle Energy Technique  gentle cervical distraction concurrent with scapular depression x 10 reps.     Neural Stretch  concurrent with gentle cervical distraction with scapular depression to enhance stretching for 30 sec holds x 3 each side.                PT Short Term Goals - 04/28/18 0810      PT SHORT TERM GOAL #1   Title  (ALL STG DUE BY 04/30/18)  Pt will participate in further assessment of balance during gait with FGA and will initiate  balance HEP    Time  4    Period  Weeks    Status  Achieved      PT SHORT TERM GOAL #2   Title  Pt will participate in full vestibular evaluation and initiate vestibular HEP    Time  4    Period  Weeks    Status  Achieved      PT SHORT TERM GOAL #3   Title  Pt will initiate cervical stretching/strengthening HEP    Time  4    Period  Weeks    Status  Achieved      PT SHORT TERM GOAL #4   Title  Pt will participate in endurance assessment with 6 min walk test and initiate walking program    Baseline  Pt has self-initiated walking program outside in her neighborhood x 20 minutes    Time  4    Period  Weeks    Status  Deferred      PT SHORT TERM GOAL #5   Title  Pt will improve gait velocity to >/= 3.0 ft/sec     Baseline  2.1ft/sec > 3.05 ft/sec    Time  4    Period  Weeks    Status  Achieved        PT Long Term Goals - 04/28/18 1007      PT LONG TERM GOAL #1   Title  (ALL LTG DUE BY 05/30/18)  Pt will demonstrate independence with HEP: balance, vestibular, cervical spine and walking program    Time  8    Period  Weeks    Status  New    Target Date  05/30/18      PT LONG TERM GOAL #2   Title  Pt will improve FGA by 4 points to decrease falls risk    Baseline  20/30    Time  8    Period  Weeks    Status  Revised    Target Date  05/30/18      PT LONG TERM GOAL #3   Title  Pt will improve gait velocity to >/= 3.6 ft/sec    Baseline  3.05 ft/sec     Time  8    Period  Weeks    Status  Revised    Target Date  05/30/18      PT LONG TERM GOAL #4   Title  Pt will improve endurance by increasing distance on 6 minute walk test by 150'    Baseline  deferred - pt self initiated walking  program    Time  8    Period  Weeks    Status  Deferred      PT LONG TERM GOAL #5   Title  Pt will improve cervical AROM by 10 degrees and report 50% reduction in headache, neck and L shoulder pain    Time  8    Period  Weeks    Status  New    Target Date  05/30/18      PT LONG  TERM GOAL #6   Title  Pt will report 0-1/5 dizziness with head nods and turns on MSQ    Baseline  3/5    Time  8    Period  Weeks    Status  New    Target Date  05/30/18            Plan - 05/12/18 1017    Clinical Impression Statement  Today's session focused on manual therapy to decrease pain and promote muscle relaxation, stretching of upper traps, levator scapula, and pectoralis major, and corner balance activity to address vestibular deficits. Pt. would benefit from continued PT to further address remaining vestibular deficits and muscle tightness in the cervical region.     PT Frequency  2x / week    PT Duration  8 weeks    PT Treatment/Interventions  ADLs/Self Care Home Management;Canalith Repostioning;Cryotherapy;Electrical Stimulation;Moist Heat;Traction;Gait training;Functional mobility training;Therapeutic activities;Therapeutic exercise;Balance training;Neuromuscular re-education;Patient/family education;Manual techniques;Passive range of motion;Dry needling;Vestibular    PT Next Visit Plan  cervical spine ROM, strengthening, manual therapy; balance activities to address vestibular deficits on compliant surfaces    PT Home Exercise Plan  [email or web address]    Consulted and Agree with Plan of Care  Patient       Patient will benefit from skilled therapeutic intervention in order to improve the following deficits and impairments:  Decreased activity tolerance, Decreased balance, Decreased range of motion, Decreased strength, Difficulty walking, Dizziness, Impaired perceived functional ability, Impaired sensation, Pain  Visit Diagnosis: Cervicalgia  Dizziness and giddiness  Difficulty in walking, not elsewhere classified     Problem List Patient Active Problem List   Diagnosis Date Noted  . Concussion with no loss of consciousness 03/17/2018  . Elevated blood pressure 07/16/2016  . Anxiety state 07/03/2016  . Neck pain 07/03/2016  . 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 Acid reflux  disease 11/27/2014  . Special screening for malignant neoplasms, colon 09/17/2013  . Obesity, unspecified 01/16/2013  . Spinal stenosis in cervical region 01/11/2013  . Multiple thyroid nodules 01/11/2013  . Hand pain 06/16/2012  . Hyperlipidemia 06/16/2012    Arthor Captain, SPTA 05/12/2018, 12:33 PM  [facility] [address] [place], [place], [address] Phone: [phone]   Fax:  [phone]  Name: Rebecca [name] MRN: [id number] Date of Birth: 1956/04/05

## 2018-05-15 ENCOUNTER — Ambulatory Visit: Payer: BC Managed Care – PPO | Admitting: Rehabilitative and Restorative Service Providers"

## 2018-05-15 ENCOUNTER — Encounter: Payer: Self-pay | Admitting: Rehabilitative and Restorative Service Providers"

## 2018-05-15 DIAGNOSIS — R42 Dizziness and giddiness: Secondary | ICD-10-CM

## 2018-05-15 DIAGNOSIS — M542 Cervicalgia: Secondary | ICD-10-CM | POA: Diagnosis not present

## 2018-05-15 DIAGNOSIS — R262 Difficulty in walking, not elsewhere classified: Secondary | ICD-10-CM

## 2018-05-15 NOTE — Patient Instructions (Addendum)
Access Code: [email or web address]  URL: [email or web address]  Date: 05/15/2018  Prepared by: Rudell Cobb   Exercises Standing Gaze Stabilization with Head Rotation - 1 reps - 3 sets - 30 hold - 3x daily - 7x weekly Seated Horizontal Saccades - 10 reps - 2 sets - 2x daily - 7x weekly Wide Stance with Eyes Closed on Foam Pad - 3 reps - 30 hold - 1x daily - 5x weekly Seated Levator Scapulae Stretch - 3 reps - 30 hold - 2x daily - 7x weekly Seated Cervical Sidebending Stretch - 3 reps - 30 hold - 2x daily - 7x weekly Walking with Head Rotation - 10 reps - 2 sets - 2x daily - 7x weekly Walking March - 10 reps - 2 sets - 2x daily - 7x weekly Prone Scapular Retraction - 10 reps - 3 sets - 1x daily - 7x weekly Forward and Backward Walking with Eyes Closed and Counter Support - 10 reps - 2 sets - 2x daily - 7x weekly Supine Cervical Retraction with Towel - 10 reps - 2 sets - 5 hold - 2x daily - 7x weekly

## 2018-05-15 NOTE — Therapy (Signed)
[facility] [address] [place], [place], [address] Phone: [phone]   Fax:  [phone]  Physical Therapy Treatment  Patient Details  Name: Rebecca [name] MRN: [id number] Date of Birth: 1955-12-19 Referring Provider: Rosemarie Ax, MD   Encounter Date: 05/15/2018  PT End of Session - 05/15/18 1208    Visit Number  10    Number of Visits  17    Date for PT Re-Evaluation  05/30/18    Authorization Type  [employer], [employer]    PT Start Time  [phone]    PT Stop Time  0930    PT Time Calculation (min)  42 min    Activity Tolerance  Patient tolerated treatment well;No increased pain    Behavior During Therapy  WFL for tasks assessed/performed       Past Medical History:  Diagnosis Date  . Hyperlipidemia   . Obesity   . Peritonsillar abscess 02/03/2017  . Thyroid disease    monitoring a nodule 1/9    Past Surgical History:  Procedure Laterality Date  . ABDOMINAL HYSTERECTOMY      There were no vitals filed for this visit.  Subjective Assessment - 05/15/18 0852    Subjective  The patient notes 5/10 baseline symptoms noting it is getting better.  She notes stress "locks up" her neck.  She is trying to increase her physical activity.   The patient returns to work 2 days this week and then 3 days next week.  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     Pertinent History  anxiety, obesity, spinal stenosis in cervical region, thyroid nodules, and hyperlipidemia    Patient Stated Goals  Return to walking (avid walker), relieve L neck pain, return to work    Currently in Pain?  Yes    Pain Score  5     Pain Location  Neck    Pain Orientation  Left    Pain Descriptors / Indicators  Aching    Pain Type  Acute pain    Pain Radiating Towards  still notes radiating symptoms down the left arm-- now only 2 days/week (was every day but is improving)    Pain Onset  1 to 4 weeks ago    Pain Frequency   Constant    Aggravating Factors   fatigue and stress    Pain Relieving Factors  rest and heat             Vestibular Assessment - 05/15/18 0857      Vestibular Assessment   General Observation  Gets dizziness 2-3 times/week when moving quickly.      Positional Sensitivities   Head Turning x 5  Lightheadedness    Head Nodding x 5  Lightheadedness               [facility] Adult PT Treatment/Exercise - 05/15/18 1216      Ambulation/Gait   Ambulation/Gait  Yes    Ambulation/Gait Assistance  7: Independent    Ambulation Distance (Feet)  300 Feet    Assistive device  None    Ambulation Surface  Level    Gait Comments  Dynamic gait activities with vertical and horizontal head motion, turns without loss of balance.      Exercises   Exercises  Neck      Neck Exercises: Standing   Other Standing Exercises  Standing retraction, initially with elbows flexed to 90 deg and at sides with  shoulder ER + scapular retraction; then tried with arms in front and red theraband.  Patient elevates shoulders, therefore performed without resistance x 10 reps.       Neck Exercises: Supine   Neck Retraction  5 reps;5 secs    Neck Retraction Limitations  added to HEP      Neck Exercises: Prone   Other Prone Exercise  Prone on elbows scapular retraction x 5 reps; prone shoulder extension adding scapular depression and lifting shoulder + head x 5 reps.      Manual Therapy   Manual Therapy  Manual Traction;Soft tissue mobilization    Manual therapy comments  Manual techniques to reduce muscle guarding, improve ROM, decrease pain    Soft tissue mobilization  upper trapezius, suboccipitals and scalenes    Manual Traction  supine gentle manual traction      Vestibular Treatment/Exercise - 05/15/18 1209      Vestibular Treatment/Exercise   Vestibular Treatment Provided  Gaze    Gaze Exercises  X1 Viewing Horizontal;X1 Viewing Vertical      X1 Viewing Horizontal   Foot Position  standing     Comments  provided cues to maintain approximately 30 degrees of rotation to each side and cues for more continuous/fluid movement      X1 Viewing Vertical   Foot Position  standing    Comments  Added to HEP- patient tolerated at today's session x 20 seconds.            PT Education - 05/15/18 1208    Education Details  modified VOR to standing (from seated); added prone scapular retraction and chin tucks to HEP    Person(s) Educated  Patient    Methods  Explanation;Demonstration;Handout    Comprehension  Verbalized understanding;Returned demonstration       PT Short Term Goals - 04/28/18 0810      PT SHORT TERM GOAL #1   Title  (ALL STG DUE BY 04/30/18)  Pt will participate in further assessment of balance during gait with FGA and will initiate balance HEP    Time  4    Period  Weeks    Status  Achieved      PT SHORT TERM GOAL #2   Title  Pt will participate in full vestibular evaluation and initiate vestibular HEP    Time  4    Period  Weeks    Status  Achieved      PT SHORT TERM GOAL #3   Title  Pt will initiate cervical stretching/strengthening HEP    Time  4    Period  Weeks    Status  Achieved      PT SHORT TERM GOAL #4   Title  Pt will participate in endurance assessment with 6 min walk test and initiate walking program    Baseline  Pt has self-initiated walking program outside in her neighborhood x 20 minutes    Time  4    Period  Weeks    Status  Deferred      PT SHORT TERM GOAL #5   Title  Pt will improve gait velocity to >/= 3.0 ft/sec     Baseline  2.34f/sec > 3.05 ft/sec    Time  4    Period  Weeks    Status  Achieved        PT Long Term Goals - 05/15/18 07902     PT LONG TERM GOAL #1   Title  (ALL LTG DUE BY  05/30/18)  Pt will demonstrate independence with HEP: balance, vestibular, cervical spine and walking program    Time  8    Period  Weeks    Status  On-going      PT LONG TERM GOAL #2   Title  Pt will improve FGA by 4 points to  decrease falls risk    Baseline  20/30    Time  8    Period  Weeks    Status  Revised      PT LONG TERM GOAL #3   Title  Pt will improve gait velocity to >/= 3.6 ft/sec    Baseline  3.05 ft/sec     Time  8    Period  Weeks    Status  Revised      PT LONG TERM GOAL #4   Title  Pt will improve endurance by increasing distance on 6 minute walk test by 150'    Baseline  deferred - pt self initiated walking program    Time  8    Period  Weeks    Status  Deferred      PT LONG TERM GOAL #5   Title  Pt will improve cervical AROM by 10 degrees and report 50% reduction in headache, neck and L shoulder pain    Time  8    Period  Weeks    Status  New      PT LONG TERM GOAL #6   Title  Pt will report 0-1/5 dizziness with head nods and turns on MSQ    Baseline  3/5 at eval; 1/5 today for both head turns and head nods.    Time  8    Period  Weeks    Status  Achieved            Plan - 05/15/18 1220    Clinical Impression Statement  PT continuing to focus on neck ROM, ppostural stabilization exercises, and gaze adaptation.  Patient met LTG for motion sensitivity.  She is returning to work x 2 days this week- PT to check in with tolerance.  Continue working to [employer].     PT Treatment/Interventions  ADLs/Self Care Home Management;Canalith Repostioning;Cryotherapy;Electrical Stimulation;Moist Heat;Traction;Gait training;Functional mobility training;Therapeutic activities;Therapeutic exercise;Balance training;Neuromuscular re-education;Patient/family education;Manual techniques;Passive range of motion;Dry needling;Vestibular    PT Next Visit Plan  cervical spine ROM, strengthening, manual therapy; balance activities to address vestibular deficits on compliant surfaces    PT Home Exercise Plan  [email or web address]    Consulted and Agree with Plan of Care  Patient       Patient will benefit from skilled therapeutic intervention in order to improve the following deficits and impairments:  Decreased  activity tolerance, Decreased balance, Decreased range of motion, Decreased strength, Difficulty walking, Dizziness, Impaired perceived functional ability, Impaired sensation, Pain  Visit Diagnosis: Cervicalgia  Dizziness and giddiness  Difficulty in walking, not elsewhere classified     Problem List Patient Active Problem List   Diagnosis Date Noted  . Concussion with no loss of consciousness 03/17/2018  . Elevated blood pressure 07/16/2016  . Anxiety state 07/03/2016  . Neck pain 07/03/2016  . 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 Acid reflux disease 11/27/2014  . Special screening for malignant neoplasms, colon 09/17/2013  . Obesity, unspecified 01/16/2013  . Spinal stenosis in cervical region 01/11/2013  . Multiple thyroid nodules 01/11/2013  . Hand pain 06/16/2012  . Hyperlipidemia 06/16/2012    [place], PT 05/15/2018, 12:22 PM  [facility]  [address] [place], [place], [address] Phone: [phone]   Fax:  [phone]  Name: Rebecca [name] MRN: [id number] Date of Birth: June 24, 1956

## 2018-05-17 ENCOUNTER — Ambulatory Visit: Payer: BC Managed Care – PPO | Admitting: Physical Therapy

## 2018-05-18 ENCOUNTER — Ambulatory Visit: Payer: BC Managed Care – PPO | Admitting: Physical Therapy

## 2018-05-18 ENCOUNTER — Encounter: Payer: Self-pay | Admitting: Physical Therapy

## 2018-05-18 ENCOUNTER — Encounter: Payer: BC Managed Care – PPO | Admitting: Physical Therapy

## 2018-05-18 DIAGNOSIS — R262 Difficulty in walking, not elsewhere classified: Secondary | ICD-10-CM

## 2018-05-18 DIAGNOSIS — M542 Cervicalgia: Secondary | ICD-10-CM

## 2018-05-18 DIAGNOSIS — R42 Dizziness and giddiness: Secondary | ICD-10-CM

## 2018-05-18 NOTE — Therapy (Signed)
[facility] [address] [place], [place], [address] Phone: [phone]   Fax:  [phone]  Physical Therapy Treatment  Patient Details  Name: Rollande Thursby MRN: [id number] Date of Birth: May 03, 1956 Referring Provider: Rosemarie Ax, MD   Encounter Date: 05/18/2018  PT End of Session - 05/18/18 1302    Visit Number  11    Number of Visits  17    Date for PT Re-Evaluation  05/30/18    Authorization Type  [employer], [facility]    PT Start Time  0803    PT Stop Time  0848    PT Time Calculation (min)  45 min    Activity Tolerance  Patient tolerated treatment well    Behavior During Therapy  [facility] for tasks assessed/performed       Past Medical History:  Diagnosis Date  . Hyperlipidemia   . Obesity   . Peritonsillar abscess 02/03/2017  . Thyroid disease    monitoring a nodule 1/9    Past Surgical History:  Procedure Laterality Date  . ABDOMINAL HYSTERECTOMY      There were no vitals filed for this visit.  Subjective Assessment - 05/18/18 0809    Subjective  Pt did not get to go back to work last week due to error with HR and paperwork.  Went back this week and has experienced increase in neck pain due to the drive and working at her desk.  6/10 this morning, was severe last night.      Pertinent History  anxiety, obesity, spinal stenosis in cervical region, thyroid nodules, and hyperlipidemia    Patient Stated Goals  Return to walking (avid walker), relieve L neck pain, return to work    Currently in Pain?  Yes    Pain Score  6     Pain Location  Neck    Pain Orientation  Right;Left    Pain Descriptors / Indicators  Radiating    Pain Type  Acute pain    Pain Onset  Yesterday                        Vestibular Treatment/Exercise - 05/18/18 0846      Vestibular Treatment/Exercise   Vestibular Treatment Provided  Gaze    Gaze Exercises  X1 Viewing Horizontal;X1 Viewing Vertical      X1  Viewing Horizontal   Foot Position  standing    Reps  2    Comments  30 > 60 seconds with mild symptoms      X1 Viewing Vertical   Foot Position  standing    Reps  2    Comments  30 > 60 seconds, mild symptoms       Stretches for the car or work:   Setup  Begin sitting in an upright position with your feet flat on the floor. Movement  Gently draw your chin in, while keeping your eyes fixed on something in front of you.  10 TIMES Tip  Make sure that you do not look down as you do this exercise, or bend your neck forward.      Setup  Begin sitting in an upright position. Movement  Gently squeeze your shoulder blades together, relax, and then repeat. 10 TIMES Tip  Make sure to maintain good posture during the exercise.       Backward shoulder rolls: Setup  Begin sitting upright with your hands resting in your lap. Movement  Move your  shoulders forward, then upward, backward, and down. Repeat, continuing to move your shoulders in a circular motion. 10 times Tip  Make sure to keep your back straight during the exercise.      Setup  Begin sitting upright, grasping the edge with one hand/or place hand under hip with palm up. Movement  Rotate your head to the side opposite your anchored arm, then tuck your chin towards your chest (look toward your armpit area).  Only if you do not feel a stretch: With your free hand, grasp the back of your head and gently pull it downward until you feel a stretch and hold 10 SECONDS.  REPEAT 2 TIMES. Tip  Make sure to keep your back straight during the exercise.     Setup  Begin sitting in an upright position. Anchor one hand underneath your buttocks with palm side up. Movement Tilt your head to the side (ear toward shoulder) on the unanchored side) until a stretch is felt.  Only if you do not feel a stretch do this: Use one hand to tilt your head sideways, pulling your ear toward one shoulder.  HOLD 10 SECONDS, REPEAT 2 TIMES  EACH SIDE. Tip  Make sure to keep your back straight and do not let your head rotate, or bend forward or backward.   Neck Stretch (Chair) - Variation 2    Hands behind torso and hips, holding sides or seat of chair, roll chest open and shoulders back. Deliberately bring chin toward chest and lengthen back of neck. Hold for _10___ breaths. Repeat _2___ times.       PT Education - 05/18/18 1300    Education provided  Yes    Education Details  progressed x 1 viewing; discussed stretches and exercises to perform in car and at desk to decrease neck tension; recommended that pt take picture of desk to see if any modifications can be made.    Person(s) Educated  Patient    Methods  Explanation;Demonstration;Handout    Comprehension  Verbalized understanding;Returned demonstration       PT Short Term Goals - 04/28/18 0810      PT SHORT TERM GOAL #1   Title  (ALL STG DUE BY 04/30/18)  Pt will participate in further assessment of balance during gait with FGA and will initiate balance HEP    Time  4    Period  Weeks    Status  Achieved      PT SHORT TERM GOAL #2   Title  Pt will participate in full vestibular evaluation and initiate vestibular HEP    Time  4    Period  Weeks    Status  Achieved      PT SHORT TERM GOAL #3   Title  Pt will initiate cervical stretching/strengthening HEP    Time  4    Period  Weeks    Status  Achieved      PT SHORT TERM GOAL #4   Title  Pt will participate in endurance assessment with 6 min walk test and initiate walking program    Baseline  Pt has self-initiated walking program outside in her neighborhood x 20 minutes    Time  4    Period  Weeks    Status  Deferred      PT SHORT TERM GOAL #5   Title  Pt will improve gait velocity to >/= 3.0 ft/sec     Baseline  2.16ft/sec > 3.05 ft/sec    Time  4  Period  Weeks    Status  Achieved        PT Long Term Goals - 05/15/18 8756      PT LONG TERM GOAL #1   Title  (ALL LTG DUE BY 05/30/18)   Pt will demonstrate independence with HEP: balance, vestibular, cervical spine and walking program    Time  8    Period  Weeks    Status  On-going      PT LONG TERM GOAL #2   Title  Pt will improve FGA by 4 points to decrease falls risk    Baseline  20/30    Time  8    Period  Weeks    Status  Revised      PT LONG TERM GOAL #3   Title  Pt will improve gait velocity to >/= 3.6 ft/sec    Baseline  3.05 ft/sec     Time  8    Period  Weeks    Status  Revised      PT LONG TERM GOAL #4   Title  Pt will improve endurance by increasing distance on 6 minute walk test by 150'    Baseline  deferred - pt self initiated walking program    Time  8    Period  Weeks    Status  Deferred      PT LONG TERM GOAL #5   Title  Pt will improve cervical AROM by 10 degrees and report 50% reduction in headache, neck and L shoulder pain    Time  8    Period  Weeks    Status  New      PT LONG TERM GOAL #6   Title  Pt will report 0-1/5 dizziness with head nods and turns on MSQ    Baseline  3/5 at eval; 1/5 today for both head turns and head nods.    Time  8    Period  Weeks    Status  Achieved            Plan - 05/18/18 1303    Clinical Impression Statement  Due to increased neck tension after driving and working treatment session focused on taking current exercise program and adapting to allow pt to utilize in the car and in the office to decrease neck/shoulder tension and maintain ROM.  Continued to review and upgrade x 1 viewing in standing by increasing time to 60 seconds.  Pt tolerated well and did not experience increase in pain.  Will continue to address in order to progress towards LTG.    PT Treatment/Interventions  ADLs/Self Care Home Management;Canalith Repostioning;Cryotherapy;Electrical Stimulation;Moist Heat;Traction;Gait training;Functional mobility training;Therapeutic activities;Therapeutic exercise;Balance training;Neuromuscular re-education;Patient/family education;Manual  techniques;Passive range of motion;Dry needling;Vestibular    PT Next Visit Plan  Did she bring picture of desk/work station - anything that can be modified to decrease neck/shoulder tension?  Continue to work on cervical ROM/upper back strengthening.  Progress x 1 viewing and vestibular training    PT Home Exercise Plan  [email or web address]    Consulted and Agree with Plan of Care  Patient       Patient will benefit from skilled therapeutic intervention in order to improve the following deficits and impairments:  Decreased activity tolerance, Decreased balance, Decreased range of motion, Decreased strength, Difficulty walking, Dizziness, Impaired perceived functional ability, Impaired sensation, Pain  Visit Diagnosis: Cervicalgia  Dizziness and giddiness  Difficulty in walking, not elsewhere classified     Problem List Patient Active Problem  List   Diagnosis Date Noted  . Concussion with no loss of consciousness 03/17/2018  . Elevated blood pressure 07/16/2016  . Anxiety state 07/03/2016  . Neck pain 07/03/2016  . 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 Acid reflux disease 11/27/2014  . Special screening for malignant neoplasms, colon 09/17/2013  . Obesity, unspecified 01/16/2013  . Spinal stenosis in cervical region 01/11/2013  . Multiple thyroid nodules 01/11/2013  . Hand pain 06/16/2012  . Hyperlipidemia 06/16/2012    Rico Junker, PT, DPT 05/18/18    8:38 PM    [facility] [address] [place], [place], [address] Phone: [phone]   Fax:  [phone]  Name: Jalaiyah Throgmorton MRN: [id number] Date of Birth: [birth year]

## 2018-05-18 NOTE — Patient Instructions (Addendum)
FOR LETTER EXERCISE: INCREASE TO 60 SECONDS.  AND WORK ON BRINGING FEET CLOSER TOGETHER FOR MORE OF A BALANCE CHALLENGE   TAKE A PICTURE OF YOUR WORK STATION/CHAIR TO DISCUSS MODIFICATIONS.    Stretches for the car or work:   Setup  Begin sitting in an upright position with your feet flat on the floor. Movement  Gently draw your chin in, while keeping your eyes fixed on something in front of you.  10 TIMES Tip  Make sure that you do not look down as you do this exercise, or bend your neck forward.      Setup  Begin sitting in an upright position. Movement  Gently squeeze your shoulder blades together, relax, and then repeat. 10 TIMES Tip  Make sure to maintain good posture during the exercise.       Backward shoulder rolls: Setup  Begin sitting upright with your hands resting in your lap. Movement  Move your shoulders forward, then upward, backward, and down. Repeat, continuing to move your shoulders in a circular motion. 10 times Tip  Make sure to keep your back straight during the exercise.      Setup  Begin sitting upright, grasping the edge with one hand/or place hand under hip with palm up. Movement  Rotate your head to the side opposite your anchored arm, then tuck your chin towards your chest (look toward your armpit area).  Only if you do not feel a stretch: With your free hand, grasp the back of your head and gently pull it downward until you feel a stretch and hold 10 SECONDS.  REPEAT 2 TIMES. Tip  Make sure to keep your back straight during the exercise.     Setup  Begin sitting in an upright position. Anchor one hand underneath your buttocks with palm side up. Movement Tilt your head to the side (ear toward shoulder) on the unanchored side) until a stretch is felt.  Only if you do not feel a stretch do this: Use one hand to tilt your head sideways, pulling your ear toward one shoulder.  HOLD 10 SECONDS, REPEAT 2 TIMES EACH SIDE. Tip  Make sure  to keep your back straight and do not let your head rotate, or bend forward or backward.   Neck Stretch (Chair) - Variation 2    Hands behind torso and hips, holding sides or seat of chair, roll chest open and shoulders back. Deliberately bring chin toward chest and lengthen back of neck. Hold for _10___ breaths. Repeat _2___ times.  Copyright  [employer]. All rights reserved.

## 2018-05-19 ENCOUNTER — Encounter: Payer: Self-pay | Admitting: Family Medicine

## 2018-05-19 ENCOUNTER — Ambulatory Visit (INDEPENDENT_AMBULATORY_CARE_PROVIDER_SITE_OTHER): Payer: BC Managed Care – PPO | Admitting: Family Medicine

## 2018-05-19 VITALS — BP 132/94 | HR 66 | Temp 97.9°F | Ht 64.2 in | Wt 193.0 lb

## 2018-05-19 DIAGNOSIS — F32A Depression, unspecified: Secondary | ICD-10-CM

## 2018-05-19 DIAGNOSIS — I10 Essential (primary) hypertension: Secondary | ICD-10-CM

## 2018-05-19 DIAGNOSIS — R8290 Unspecified abnormal findings in urine: Secondary | ICD-10-CM | POA: Diagnosis not present

## 2018-05-19 DIAGNOSIS — E782 Mixed hyperlipidemia: Secondary | ICD-10-CM | POA: Diagnosis not present

## 2018-05-19 DIAGNOSIS — R7989 Other specified abnormal findings of blood chemistry: Secondary | ICD-10-CM | POA: Diagnosis not present

## 2018-05-19 DIAGNOSIS — G4700 Insomnia, unspecified: Secondary | ICD-10-CM

## 2018-05-19 DIAGNOSIS — F329 Major depressive disorder, single episode, unspecified: Secondary | ICD-10-CM

## 2018-05-19 DIAGNOSIS — F4310 Post-traumatic stress disorder, unspecified: Secondary | ICD-10-CM | POA: Diagnosis not present

## 2018-05-19 DIAGNOSIS — F419 Anxiety disorder, unspecified: Secondary | ICD-10-CM

## 2018-05-19 DIAGNOSIS — Z09 Encounter for follow-up examination after completed treatment for conditions other than malignant neoplasm: Secondary | ICD-10-CM

## 2018-05-19 DIAGNOSIS — E559 Vitamin D deficiency, unspecified: Secondary | ICD-10-CM | POA: Diagnosis not present

## 2018-05-19 LAB — POCT URINALYSIS DIP (MANUAL ENTRY)
Bilirubin, UA: NEGATIVE
Glucose, UA: NEGATIVE mg/dL
Ketones, POC UA: NEGATIVE mg/dL
Leukocytes, UA: NEGATIVE
Nitrite, UA: NEGATIVE
Protein Ur, POC: NEGATIVE mg/dL
Spec Grav, UA: <=1.005 — AB (ref 1.010–1.025)
Urobilinogen, UA: 0.2 E.U./dL
pH, UA: 6.5 (ref 5.0–8.0)
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
## 2018-05-21 LAB — URINE CULTURE

## 2018-05-22 ENCOUNTER — Encounter: Payer: Self-pay | Admitting: Physical Therapy

## 2018-05-22 ENCOUNTER — Ambulatory Visit: Payer: BC Managed Care – PPO | Admitting: Physical Therapy

## 2018-05-22 DIAGNOSIS — R42 Dizziness and giddiness: Secondary | ICD-10-CM

## 2018-05-22 DIAGNOSIS — M542 Cervicalgia: Secondary | ICD-10-CM | POA: Diagnosis not present

## 2018-05-22 NOTE — Therapy (Signed)
[facility] [address] [place], [place], [address] Phone: [phone]   Fax:  [phone]  Physical Therapy Treatment  Patient Details  Name: Rebecca [name] MRN: [id number] Date of Birth: [birth year] Referring Provider: Rosemarie Ax, MD   Encounter Date: 05/22/2018  PT End of Session - 05/22/18 1104    Visit Number  12    Number of Visits  17    Date for PT Re-Evaluation  05/30/18    Authorization Type  [employer], [facility]    PT Start Time  [phone]    PT Stop Time  [phone]    PT Time Calculation (min)  46 min       Past Medical History:  Diagnosis Date  . Hyperlipidemia   . Obesity   . Peritonsillar abscess 02/03/2017  . Thyroid disease    monitoring a nodule 1/9    Past Surgical History:  Procedure Laterality Date  . ABDOMINAL HYSTERECTOMY      There were no vitals filed for this visit.  Subjective Assessment - 05/22/18 1038    Subjective  Pt states she worked last [employer]. and Wed. and is scheduled to work Tues, Wed., [employer] this week (has to drive to [place])  - is concerned about working 3 days in a row this week due to feeling exhausted after working 2 straight days last week; pt reports the dizziness is mostly related to the pain at this time - if pain is increased she seems to have increased dizziness                                                                Pertinent History  anxiety, obesity, spinal stenosis in cervical region, thyroid nodules, and hyperlipidemia    Limitations  Walking;Standing    Diagnostic tests  CT scan of head and c-spine    Patient Stated Goals  Return to walking (avid walker), relieve L neck pain, return to work    Currently in Pain?  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 shoulder retraction with elbows flexed at 90 degrees with external rotation bil. UE's, holding for 2 secs for shoulder  retraction; standing scapular retraction with arms straight, diagonal abduction 10 reps each diagonal pattern    Other Standing Exercises  Pt performed dips using countertop - 5 reps x 2 sets with cues to perform with small ROM       Neck Exercises: Seated   Cervical Rotation  Both;Other reps (comment) 2 reps - used towel for Mulligan's stretch 30 sec hold    Lateral Flexion  Both;5 reps    W Back  10 reps with red theraband    Other Seated Exercise  seated rows with elbows flexed at 90 with use of red theraband for resistance - for scapular retraction; seated horizontal shoulder abduction with use of red theraband x 10 reps for scapula retraction       Manual Therapy   Manual Therapy  Soft tissue mobilization to trigger point in Lt upper trap - pt reported tenderness       Neck Exercises: Stretches   Other Neck Stretches  Mulligan's stretch with towel - with emphasis on cerivcal rotation to Rt side for Lt upper trap stretching - pt performed 2 reps 30 sec hold to Rt side; 1 rep to left side x 30 sec hold      Static visual acuity - line 10;  Dynamic visual acuity line 9 (WNL's)       PT Education - 05/22/18 1101    Education  provided  Yes    Education Details  recommended use of BioFreeze for upper trap trigger point pain; also discussed possible benefit of TENS for cervical pain:  added Mulligan's stretch with use of towel for cervical rotation stretch    Person(s) Educated  Patient    Methods  Explanation;Demonstration    Comprehension  Verbalized understanding;Returned demonstration       PT Short Term Goals - 04/28/18 0810      PT SHORT TERM GOAL #1   Title  (ALL STG DUE BY 04/30/18)  Pt will participate in further assessment of balance during gait with FGA and will initiate balance HEP    Time  4    Period  Weeks    Status  Achieved      PT SHORT TERM GOAL #2   Title  Pt will participate in full vestibular evaluation and initiate vestibular HEP    Time  4    Period  Weeks    Status  Achieved      PT SHORT TERM GOAL #3   Title  Pt will initiate cervical stretching/strengthening HEP    Time  4    Period  Weeks    Status  Achieved      PT SHORT TERM GOAL #4   Title  Pt will participate in endurance assessment with 6 min walk test and initiate walking program    Baseline  Pt has self-initiated walking program outside in her neighborhood x 20 minutes    Time  4    Period  Weeks    Status  Deferred      PT SHORT TERM GOAL #5   Title  Pt will improve gait velocity to >/= 3.0 ft/sec     Baseline  2.40ft/sec > 3.05 ft/sec    Time  4    Period  Weeks    Status  Achieved        PT Long Term Goals - 05/15/18 0858      PT LONG TERM GOAL #1   Title  (ALL LTG DUE BY 05/30/18)  Pt  will demonstrate independence with HEP: balance, vestibular, cervical spine and walking program    Time  8    Period  Weeks    Status  On-going      PT LONG TERM GOAL #2   Title  Pt will improve FGA by 4 points to decrease falls risk    Baseline  20/30    Time  8    Period  Weeks    Status  Revised      PT LONG TERM GOAL #3   Title  Pt will improve gait velocity to >/= 3.6 ft/sec    Baseline  3.05 ft/sec      Time  8    Period  Weeks    Status  Revised      PT LONG TERM GOAL #4   Title  Pt will improve endurance by increasing distance on 6 minute walk test by 150'    Baseline  deferred - pt self initiated walking program    Time  8    Period  Weeks    Status  Deferred      PT LONG TERM GOAL #5   Title  Pt will improve cervical AROM by 10 degrees and report 50% reduction in headache, neck and L shoulder pain    Time  8    Period  Weeks    Status  New      PT LONG TERM GOAL #6   Title  Pt will report 0-1/5 dizziness with head nods and turns on MSQ    Baseline  3/5 at eval; 1/5 today for both head turns and head nods.    Time  8    Period  Weeks    Status  Achieved            Plan - 05/22/18 1400    Clinical Impression Statement  Pt presents with moderately sized trigger point in Lt upper trap with c/o tenderness with palpation.  Pt reports correlation between neck pain and dizziness with pt reporting her main problem is the Lt upper trap/cervical pain.  Pt has concerns regarding return to work this week with pt scheduled to work 3 consecutive days requiring driving to [place] for work.  Pt appears to benefit from additional modailities for pain management including dry needling and/or TENS  unit.  Pt's DVA is WNL's with 1 line difference from static visual acuity.      Rehab Potential  Good    PT Frequency  2x / week    PT Duration  8 weeks    PT Treatment/Interventions  ADLs/Self Care Home Management;Canalith Repostioning;Cryotherapy;Electrical Stimulation;Moist Heat;Traction;Gait training;Functional mobility training;Therapeutic activities;Therapeutic exercise;Balance training;Neuromuscular re-education;Patient/family education;Manual techniques;Passive range of motion;Dry needling;Vestibular    PT Next Visit Plan  address cervical pain/Lt upper trap trigger point - I feel she should be referred for dry needling as stated in plan - POC ends next week. ? TENS    PT Home Exercise Plan   [id number]    Consulted and Agree with Plan of Care  Patient       Patient will benefit from skilled therapeutic intervention in order to improve the following deficits and impairments:  Decreased activity tolerance, Decreased balance, Decreased range of motion, Decreased strength, Difficulty walking, Dizziness, Impaired perceived functional ability, Impaired sensation, Pain  Visit Diagnosis: Cervicalgia  Dizziness and giddiness     Problem List Patient Active Problem List   Diagnosis Date Noted  . Concussion with no loss of consciousness 03/17/2018  .  Elevated blood pressure 07/16/2016  . Anxiety state 07/03/2016  . Neck pain 07/03/2016  . 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 Acid reflux disease 11/27/2014  . Special screening for malignant neoplasms, colon 09/17/2013  . Obesity, unspecified 01/16/2013  . Spinal stenosis in cervical region 01/11/2013  . Multiple thyroid nodules 01/11/2013  . Hand pain 06/16/2012  . Hyperlipidemia 06/16/2012    Alda Lea, PT 05/22/2018, 2:01 PM  [facility] [address] [place], [place], [address] Phone: [phone]   Fax:  [phone]  Name: Rebecca [name] MRN: [id number] Date of Birth: 17-Jan-1956

## 2018-05-25 ENCOUNTER — Ambulatory Visit: Payer: BC Managed Care – PPO | Admitting: Family Medicine

## 2018-05-25 ENCOUNTER — Ambulatory Visit (INDEPENDENT_AMBULATORY_CARE_PROVIDER_SITE_OTHER): Payer: BC Managed Care – PPO | Admitting: Family Medicine

## 2018-05-25 ENCOUNTER — Encounter: Payer: Self-pay | Admitting: Family Medicine

## 2018-05-25 DIAGNOSIS — S060X0S Concussion without loss of consciousness, sequela: Secondary | ICD-10-CM | POA: Diagnosis not present

## 2018-05-25 NOTE — Progress Notes (Signed)
Rebecca [name] - 62 y.o. female MRN [id number]  Date of birth: 1956/01/06  SUBJECTIVE:  Including CC & ROS.  Chief Complaint  Patient presents with  . Follow-up    Rebecca [name] is a 62 y.o. female that is here today for concussion follow up. She has been in physical therapy twice a week. She is still experiencing intermittent headaches. Has been starting back to work. She notices that she gets less sleep with work. Her symptoms are worse when she has to travel to [place] for work. She feels anxious on the road. Take the nortriptyline intermittently. Scheduled to see a counselor in August.     Review of Systems  Constitutional: Negative for fever.  HENT: Negative for congestion.   Respiratory: Negative for cough.   Cardiovascular: Negative for chest pain.  Gastrointestinal: Negative for abdominal pain.  Musculoskeletal: Positive for neck pain.  Skin: Negative for color change.  Neurological: Positive for headaches.  Psychiatric/Behavioral: The patient is nervous/anxious.     HISTORY: Past Medical, Surgical, Social, and Family History Reviewed & Updated per EMR.   Pertinent Historical Findings include:  Past Medical History:  Diagnosis Date  . Hyperlipidemia   . Obesity   . Peritonsillar abscess 02/03/2017  . Thyroid disease    monitoring a nodule 1/9    Past Surgical History:  Procedure Laterality Date  . ABDOMINAL HYSTERECTOMY      Allergies  Allergen Reactions  . Prednisolone Other (See Comments)    Patient can't remember  Other reaction(s): Delusions (intolerance) Patient can't remember     Family History  Problem Relation Age of Onset  . Cancer Mother        pancreatic cancer  . Diabetes Mother   . Heart disease Mother        rheumatic heart disease  . Hyperlipidemia Mother   . COPD Sister   . Sleep apnea Sister   . Cancer Sister        Lung Cancer  . Cancer Brother        lung cancer  . Cancer Father        renal, bone  . Diabetes Father   .  Hyperlipidemia Father   . Heart attack Maternal Grandfather   . Kidney disease Brother   . Colon cancer Maternal Aunt      Social History   Socioeconomic History  . Marital status: Single    Spouse name: N/A  . Number of children: 0  . Years of education: 18.5  . Highest education level: Not on file  Occupational History  . Occupation: Retired    Fish farm manager: Psychologist, sport and exercise    Comment: [employer] x 30 years  . 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 Financial resource strain: Not on file  . Food insecurity:    Worry: Not on file    Inability: Not on file  . Transportation needs:    Medical: Not on file    Non-medical: Not on file  Tobacco Use  . Smoking status: Never Smoker  . Smokeless tobacco: Never Used  Substance and Sexual Activity  . Alcohol use: Yes    Alcohol/week: 3.0 - 4.2 oz    Types: 5 - 7 Standard drinks or equivalent per week    Comment: 4-5 * week/ 1-2 drinks  . Drug use: No  . Sexual activity: Yes    Partners: Male    Birth control/protection: Surgical  Lifestyle  .  Physical activity:    Days per week: Not on file    Minutes per session: Not on file  . Stress: Not on file  Relationships  . Social connections:    Talks on phone: Not on file    Gets together: Not on file    Attends religious service: Not on file    Active member of club or organization: Not on file    Attends meetings of clubs or organizations: Not on file    Relationship status: Not on file  . Intimate partner violence:    Fear of current or ex partner: Not on file    Emotionally abused: Not on file    Physically abused: Not on file    Forced sexual activity: Not on file  Other Topics Concern  . Not on file  Social History Narrative   Close friend/significant other killed (GSW) 2012.   Retired after 30 years, and took another job (commutes to [place] 4 days/week).     PHYSICAL EXAM:  VS: Pulse 86   Ht 5'  4.2" (1.631 m)   BMI 32.92 kg/m  Physical Exam Gen: NAD, alert, cooperative with exam, well-appearing ENT: normal lips, normal nasal mucosa,  Eye: normal EOM, normal conjunctiva and lids CV:  no edema, +2 pedal pulses   Resp: no accessory muscle use, non-labored,   Skin: no rashes, no areas of induration  Neuro: normal tone, normal sensation to touch Psych:  normal insight, alert and oriented MSK: normal gait, normal strength      ASSESSMENT & PLAN:   Concussion with no loss of consciousness Has started back to work. Doesn't feel much improvement in her symptoms since last time. I feel her symptoms are more associated with anxiety vs PTSD as opposed to her concussion now. Would consider having her work every other day instead of back to back. Also would consider stopping the nortriptyline and starting zoloft as a longer term medication at this point. Has counseling scheduled  - is going on vacation and will follow up after. Can make medication changes during that time and changes to work.

## 2018-05-25 NOTE — Patient Instructions (Signed)
Good to see you  Please see me back in 2-3 weeks. We could try a different medication at that time or we may need to augment your work schedule.

## 2018-05-26 ENCOUNTER — Encounter: Payer: Self-pay | Admitting: Physical Therapy

## 2018-05-26 ENCOUNTER — Ambulatory Visit: Payer: BC Managed Care – PPO | Admitting: Physical Therapy

## 2018-05-26 DIAGNOSIS — M542 Cervicalgia: Secondary | ICD-10-CM

## 2018-05-26 DIAGNOSIS — R42 Dizziness and giddiness: Secondary | ICD-10-CM
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
## 2018-05-27 NOTE — Assessment & Plan Note (Signed)
Has started back to work. Doesn't feel much improvement in her symptoms since last time. I feel her symptoms are more associated with anxiety vs PTSD as opposed to her concussion now. Would consider having her work every other day instead of back to back. Also would consider stopping the nortriptyline and starting zoloft as a longer term medication at this point. Has counseling scheduled  - is going on vacation and will follow up after. Can make medication changes during that time and changes to work.

## 2018-05-29 ENCOUNTER — Encounter: Payer: Self-pay | Admitting: Physical Therapy

## 2018-05-29 ENCOUNTER — Ambulatory Visit: Payer: BC Managed Care – PPO | Admitting: Physical Therapy

## 2018-05-29 DIAGNOSIS — R262 Difficulty in walking, not elsewhere classified: Secondary | ICD-10-CM

## 2018-05-29 DIAGNOSIS — R42 Dizziness and giddiness: Secondary | ICD-10-CM

## 2018-05-29 DIAGNOSIS — M542 Cervicalgia: Secondary | ICD-10-CM | POA: Diagnosis not present

## 2018-05-29 NOTE — Therapy (Signed)
[facility] [place], [place], [address] Phone: [phone]   Fax:  [phone]  Physical Therapy Treatment  Patient Details  Name: Rebecca [name] MRN: [id number] Date of Birth: December 19, 1955 Referring Provider: Rosemarie Ax, MD   Encounter Date: 05/29/2018  PT End of Session - 05/29/18 1128    Visit Number  14    Number of Visits  17    Date for PT Re-Evaluation  05/30/18    Authorization Type  [employer], [employer]    PT Start Time  1015    PT Stop Time  1108    PT Time Calculation (min)  53 min    Activity Tolerance  Patient tolerated treatment well    Behavior During Therapy  [facility] for tasks assessed/performed       Past Medical History:  Diagnosis Date  . Hyperlipidemia   . Obesity   . Peritonsillar abscess 02/03/2017  . Thyroid disease    monitoring a nodule 1/9    Past Surgical History:  Procedure Laterality Date  . ABDOMINAL HYSTERECTOMY      There were no vitals filed for this visit.  Subjective Assessment - 05/29/18 1019    Subjective  Patient was in the car for several hours over the weekend. Her neck was more sore after being in the car.     Pertinent History  anxiety, obesity, spinal stenosis in cervical region, thyroid nodules, and hyperlipidemia    Limitations  Walking;Standing    Diagnostic tests  CT scan of head and c-spine    Patient Stated Goals  Return to walking (avid walker), relieve L neck pain, return to work    Currently in Pain?  Yes    Pain Score  5     Pain Location  Neck    Pain Orientation  Left    Pain Descriptors / Indicators  Aching    Pain Type  Chronic pain    Pain Radiating Towards  occasional pain radiating down the arm     Pain Onset  More than a month ago    Pain Frequency  Constant    Aggravating Factors   fatigue and stresss     Pain Relieving Factors  rest and heat                        [facility] Adult PT Treatment/Exercise - 05/29/18 0001       Manual Therapy   Manual therapy comments  skilled palaption of trigger points     Soft tissue mobilization  upper trapezius, suboccipitals and scalenes    Manual Traction  supine gentle manual traction with above PROM      Neck Exercises: Stretches   Upper Trapezius Stretch Limitations  3x20 sec hold     Levator Stretch Limitations  3x20 sec hold     Other Neck Stretches  reviewed self trigger point release        Trigger Point Dry Needling - 05/29/18 1211    Consent Given?  Yes    Education Handout Provided  No    Upper Trapezius Response  Twitch reponse elicited    Longissimus Response  Twitch response elicited C3 and C4 paraspinals            PT Education - 05/29/18 1020    Education provided  Yes    Education Details  reviewed benefits and risks point dry needling updated HEp for stretches     Person(s)  Educated  Patient    Methods  Explanation;Demonstration    Comprehension  Verbalized understanding;Returned demonstration;Verbal cues required;Tactile cues required;Need further instruction       PT Short Term Goals - 04/28/18 0810      PT SHORT TERM GOAL #1   Title  (ALL STG DUE BY 04/30/18)  Pt will participate in further assessment of balance during gait with FGA and will initiate balance HEP    Time  4    Period  Weeks    Status  Achieved      PT SHORT TERM GOAL #2   Title  Pt will participate in full vestibular evaluation and initiate vestibular HEP    Time  4    Period  Weeks    Status  Achieved      PT SHORT TERM GOAL #3   Title  Pt will initiate cervical stretching/strengthening HEP    Time  4    Period  Weeks    Status  Achieved      PT SHORT TERM GOAL #4   Title  Pt will participate in endurance assessment with 6 min walk test and initiate walking program    Baseline  Pt has self-initiated walking program outside in her neighborhood x 20 minutes    Time  4    Period  Weeks    Status  Deferred      PT SHORT TERM GOAL #5   Title  Pt will  improve gait velocity to >/= 3.0 ft/sec     Baseline  2.36ft/sec > 3.05 ft/sec    Time  4    Period  Weeks    Status  Achieved        PT Long Term Goals - 05/15/18 3810      PT LONG TERM GOAL #1   Title  (ALL LTG DUE BY 05/30/18)  Pt will demonstrate independence with HEP: balance, vestibular, cervical spine and walking program    Time  8    Period  Weeks    Status  On-going      PT LONG TERM GOAL #2   Title  Pt will improve FGA by 4 points to decrease falls risk    Baseline  20/30    Time  8    Period  Weeks    Status  Revised      PT LONG TERM GOAL #3   Title  Pt will improve gait velocity to >/= 3.6 ft/sec    Baseline  3.05 ft/sec     Time  8    Period  Weeks    Status  Revised      PT LONG TERM GOAL #4   Title  Pt will improve endurance by increasing distance on 6 minute walk test by 150'    Baseline  deferred - pt self initiated walking program    Time  8    Period  Weeks    Status  Deferred      PT LONG TERM GOAL #5   Title  Pt will improve cervical AROM by 10 degrees and report 50% reduction in headache, neck and L shoulder pain    Time  8    Period  Weeks    Status  New      PT LONG TERM GOAL #6   Title  Pt will report 0-1/5 dizziness with head nods and turns on MSQ    Baseline  3/5 at eval; 1/5 today for both head turns  and head nods.    Time  8    Period  Weeks    Status  Achieved            Plan - 05/29/18 1128    Clinical Impression Statement  Patient had a good twitch response with her cervical paraspinals and a fair response with her upper trap. Therapy reviewed techniuques to reduce post needle soreness including 2 new stretches. Therapy would like to needle her sub-occipitals but today three needles seemed to be about what she could tolerate. The patient has tightness in her paraspinals and upper trap but appeared improved after needling and soft tissue mobilization.     Clinical Presentation  Stable    Clinical Decision Making  Low    Rehab  Potential  Good    PT Frequency  2x / week    PT Duration  8 weeks    PT Treatment/Interventions  ADLs/Self Care Home Management;Canalith Repostioning;Cryotherapy;Electrical Stimulation;Moist Heat;Traction;Gait training;Functional mobility training;Therapeutic activities;Therapeutic exercise;Balance training;Neuromuscular re-education;Patient/family education;Manual techniques;Passive range of motion;Dry needling;Vestibular    PT Next Visit Plan   See how dry needling appt went; address any remaining cervical pain and ROM restriction. Begin to check LTG's for recert vs. discharge on Friday.    PT Home Exercise Plan  [id number]    Consulted and Agree with Plan of Care  Patient       Patient will benefit from skilled therapeutic intervention in order to improve the following deficits and impairments:  Decreased activity tolerance, Decreased balance, Decreased range of motion, Decreased strength, Difficulty walking, Dizziness, Impaired perceived functional ability, Impaired sensation, Pain  Visit Diagnosis: Cervicalgia  Dizziness and giddiness  Difficulty in walking, not elsewhere classified     Problem List Patient Active Problem List   Diagnosis Date Noted  . Concussion with no loss of consciousness 03/17/2018  . Elevated blood pressure 07/16/2016  . Anxiety state 07/03/2016  . Neck pain 07/03/2016  . 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 Acid reflux disease 11/27/2014  . Special screening for malignant neoplasms, colon 09/17/2013  . Obesity, unspecified 01/16/2013  . Spinal stenosis in cervical region 01/11/2013  . Multiple thyroid nodules 01/11/2013  . Hand pain 06/16/2012  . Hyperlipidemia 06/16/2012    Carney Living PT DPT  05/29/2018, 12:13 PM  [facility] [address] [place], [place], [address] Phone: [phone]   Fax:  [phone]  Name: Rebecca [name] MRN: [id number] Date of Birth: April 27, 1956

## 2018-05-31 ENCOUNTER — Encounter: Payer: BC Managed Care – PPO | Admitting: Physical Therapy

## 2018-06-01 ENCOUNTER — Encounter: Payer: BC Managed Care – PPO | Admitting: Physical Therapy

## 2018-06-02 ENCOUNTER — Ambulatory Visit: Payer: BC Managed Care – PPO | Admitting: Physical Therapy

## 2018-06-02 ENCOUNTER — Encounter: Payer: Self-pay | Admitting: Physical Therapy

## 2018-06-02 DIAGNOSIS — M542 Cervicalgia: Secondary | ICD-10-CM

## 2018-06-02 DIAGNOSIS — R262 Difficulty in walking, not elsewhere classified: Secondary | ICD-10-CM

## 2018-06-02 DIAGNOSIS — R42 Dizziness and giddiness: Secondary | ICD-10-CM

## 2018-06-02 NOTE — Therapy (Addendum)
Rebecca [name] [address] [place], [place], [address] Phone: [phone]   Fax:  [phone]  Physical Therapy Treatment  Patient Details  Name: Rebecca [name] MRN: [id number] Date of Birth: 10-Aug-1956 Referring Provider: Rosemarie Ax, MD   Encounter Date: 06/02/2018    06/02/18 0807  PT Visits / Re-Eval  Visit Number 15  Number of Visits 23 (per renewal)  Date for PT Re-Evaluation 07/17/18 (per renewal)  Authorization  Authorization Type Sherre Poot, [facility]  PT Time Calculation  PT Start Time [phone]  PT Stop Time 0845  PT Time Calculation (min) 41 min  PT - End of Session  Activity Tolerance Patient tolerated treatment well;No increased pain  Behavior During Therapy WFL for tasks assessed/performed    Past Medical History:  Diagnosis Date  . Hyperlipidemia   . Obesity   . Peritonsillar abscess 02/03/2017  . Thyroid disease    monitoring a nodule 1/9    Past Surgical History:  Procedure Laterality Date  . ABDOMINAL HYSTERECTOMY      There were no vitals filed for this visit.  Subjective Assessment - 06/02/18 0806    Subjective  Reports it's been a stressful week with alot of driving. Neck is sore and tight.     Pertinent History  anxiety, obesity, spinal stenosis in cervical region, thyroid nodules, and hyperlipidemia    Limitations  Walking;Standing    Diagnostic tests  CT scan of head and c-spine    Patient Stated Goals  Return to walking (avid walker), relieve L neck pain, return to work    Currently in Pain?  Yes    Pain Score  6     Pain Location  Neck    Pain Orientation  Left    Pain Descriptors / Indicators  Aching;Tightness    Pain Type  Chronic pain    Pain Onset  More than a month ago    Pain Frequency  Constant    Aggravating Factors   fatigue and stress    Pain Relieving Factors  rest and heat         [facility] PT Assessment - 06/02/18 0811      AROM   Overall AROM    Deficits;Due to pain    Overall AROM Comments  measurements obtained with inclinometer in sitting with feet support except for rotation done in supine.     AROM Assessment Site  Cervical    Cervical Flexion  30    Cervical Extension  10    Cervical - Right Side Bend  15    Cervical - Left Side Bend  20    Cervical - Right Rotation  35    Cervical - Left Rotation  38      Standardized Balance Assessment   Standardized Balance Assessment  10 meter walk test    10 Meter Walk  9.97 sec's= 3.29 ft/sec no AD      Functional Gait  Assessment   Gait assessed   Yes    Gait Level Surface  Walks 20 ft in less than 7 sec but greater than 5.5 sec, uses assistive device, slower speed, mild gait deviations, or deviates 6-10 in outside of the 12 in walkway width.    Change in Gait Speed  Able to change speed, demonstrates mild gait deviations, deviates 6-10 in outside of the 12 in walkway width, or no gait deviations, unable to achieve a major change in velocity, or uses a change in velocity, or  uses an assistive device.    Gait with Horizontal Head Turns  Performs head turns smoothly with no change in gait. Deviates no more than 6 in outside 12 in walkway width    Gait with Vertical Head Turns  Performs head turns with no change in gait. Deviates no more than 6 in outside 12 in walkway width.    Gait and Pivot Turn  Pivot turns safely within 3 sec and stops quickly with no loss of balance.    Step Over Obstacle  Is able to step over one shoe box (4.5 in total height) without changing gait speed. No evidence of imbalance.    Gait with Narrow Base of Support  Is able to ambulate for 10 steps heel to toe with no staggering.    Gait with Eyes Closed  Walks 20 ft, uses assistive device, slower speed, mild gait deviations, deviates 6-10 in outside 12 in walkway width. Ambulates 20 ft in less than 9 sec but greater than 7 sec.    Ambulating Backwards  Walks 20 ft, uses assistive device, slower speed, mild gait  deviations, deviates 6-10 in outside 12 in walkway width.    Steps  Alternating feet, no rail.    Total Score  25    FGA comment:  25/30= low risk for falling category             [facility] Adult PT Treatment/Exercise - 06/02/18 1654      Manual Therapy   Manual Therapy  Soft tissue mobilization;Myofascial release;Manual Traction;Neural Stretch    Manual therapy comments  all manual therapy was performed for decreased pain, decreased tightness and to improve range of motion.     Soft tissue mobilization  upper trapezius, suboccipitals and scalenes    Myofascial Release  to bil upper traps, STM and cervical paraspinals    Manual Traction  suboccipital release for 30 sec holds; then gentle cervical distraction, progressing to cervical distraction concurrent with gentle overpressure to shoulder for increased muscular stretching, 1 minute holds x 3-4 reps each side.                          Muscle Energy Technique  gentle cervical distraction concurrent with scapular depression x 10 reps, then concurrent with scapular retraction x 10 reps.           PT Short Term Goals - 04/28/18 0810      PT SHORT TERM GOAL #1   Title  (ALL STG DUE BY 04/30/18)  Pt will participate in further assessment of balance during gait with FGA and will initiate balance HEP    Time  4    Period  Weeks    Status  Achieved      PT SHORT TERM GOAL #2   Title  Pt will participate in full vestibular evaluation and initiate vestibular HEP    Time  4    Period  Weeks    Status  Achieved      PT SHORT TERM GOAL #3   Title  Pt will initiate cervical stretching/strengthening HEP    Time  4    Period  Weeks    Status  Achieved      PT SHORT TERM GOAL #4   Title  Pt will participate in endurance assessment with 6 min walk test and initiate walking program    Baseline  Pt has self-initiated walking program outside in her neighborhood x 20 minutes  Time  4    Period  Weeks    Status  Deferred      PT SHORT  TERM GOAL #5   Title  Pt will improve gait velocity to >/= 3.0 ft/sec     Baseline  2.35f/sec > 3.05 ft/sec    Time  4    Period  Weeks    Status  Achieved        PT Long Term Goals - 06/02/18 0809      PT LONG TERM GOAL #1   Title  (ALL LTG DUE BY 05/30/18)  Pt will demonstrate independence with HEP: balance, vestibular, cervical spine and walking program    Baseline  06/02/18: pt has met with current HEP    Time  --    Period  --    Status  Achieved      PT LONG TERM GOAL #2   Title  Pt will improve FGA by 4 points to decrease falls risk    Baseline  06/02/18: met with score of 25/30 today (inproved from last score of 20/30)    Time  --    Period  --    Status  Achieved      PT LONG TERM GOAL #3   Title  Pt will improve gait velocity to >/= 3.6 ft/sec    Baseline  06/02/18; improved to 3.29 ft/sec no AD, just not to goal (was 3.05 ft/sec last session)    Time  --    Period  --    Status  Partially Met      PT LONG TERM GOAL #4   Title  Pt will improve endurance by increasing distance on 6 minute walk test by 150'    Baseline  deferred - pt self initiated walking program    Time  --    Period  --    Status  Deferred      PT LONG TERM GOAL #5   Title  Pt will improve cervical AROM by 10 degrees and report 50% reduction in headache, neck and L shoulder pain    Baseline  06/02/18: most measurments today had decreased or no change, pt does continue to report frequent headaches as well as neck/left shoulder pain    Time  --    Period  --    Status  Not Met      PT LONG TERM GOAL #6   Title  Pt will report 0-1/5 dizziness with head nods and turns on MSQ    Baseline  06/02/18: continues to report dizziness with these movements of 3-4/10 at times    Time  --    Period  --    Status Not Met      New Goals for re-certification: PT Short Term Goals - 06/06/18 1148      PT SHORT TERM GOAL #1   Title  (All STG due by 06/23/2018)  Pt will demonstrate 5-8 degree increase in  cervical spine ROM to improve safety with driving    Time  3    Period  Weeks    Status  New    Target Date  06/23/18      PT SHORT TERM GOAL #2   Title  Pt will report decreased motion sensitivity on MSQ to 2-3/5 (mild-moderate)    Baseline  3-4 (moderate to severe)    Time  3    Period  Weeks    Status  New    Target Date  06/23/18      PT SHORT TERM GOAL #3   Title  Pt will demonstrate improved safety with gait as indicated by increase in gait velocity to >/= 3.6 ft/sec    Baseline  3.2 ft/sec    Time  3    Period  Weeks    Status  New    Target Date  06/23/18      PT Long Term Goals - 06/06/18 1154      PT LONG TERM GOAL #1   Title  (ALL LTG DUE BY 07/17/18)  Pt will demonstrate independence with HEP: balance, vestibular, cervical spine and walking program    Time  6    Period  Weeks    Status  Revised    Target Date  07/17/18      PT LONG TERM GOAL #2   Title  Pt will improve FGA by 2-3 points to decrease falls risk    Baseline  06/02/18: met with score of 25/30 today (inproved from last score of 20/30)    Time  6    Period  Weeks    Status  Revised    Target Date  07/17/18      PT LONG TERM GOAL #3   Title  Pt will improve gait velocity to >/= 3.8 ft/sec    Time  6    Period  Weeks    Status  Revised    Target Date  07/17/18      PT LONG TERM GOAL #4   Title  Pt will improve cervical AROM by 10 degrees (from STG measurements) and report 50% reduction in headache, neck and L shoulder pain    Time  6    Period  Weeks    Status  Revised    Target Date  07/17/18      PT LONG TERM GOAL #5   Title  Pt will report 1-2 (lightheaded/mild) dizziness with movements on MSQ    Time  6    Period  Weeks    Status  Revised    Target Date  07/17/18         06/02/18 0808  Plan  Clinical Impression Statement Today's skilled session addressed progress toward LTGs with pt meeting her Functional Gait Assessment goal and progressing toward others. She did not meet the  range of motion or pain goals this session. Pt continues to present with muscle tightnes and has started attending dry needling appts at the [facility] office. She appears she would benefit from continued PT to address these remaining issues. Primary PT in agreeament and to complete recert.     Pt is making slow but steady progress towards LTG.  Pt has met 2/6 LTG, partially met 1 goal, 1 goal deferred and 2 goals not met.  6 minute walk test goal was deferred due to pt self-initiating walking program each day community distances.  Pt does demonstrate improved safety and balance during gait as indicated by improvement in FGA score and gait velocity.  Pt did not meet cervical spine ROM and pain goal and plans to continue to participate in dry needling, ROM and stabilization training.  Pt also did not meet MSQ goal, likely due to ongoing cervical spine pain and decreased ROM.  Will continue to address with vestibular rehab.   Rico Junker, PT, DPT 06/06/18    12:04 PM  Pt will benefit from skilled therapeutic intervention in order to improve on the following deficits Decreased activity tolerance;Decreased balance;Decreased range of motion;Decreased strength;Difficulty walking;Dizziness;Impaired perceived functional ability;Impaired sensation;Pain  Rehab Potential Good  PT Frequency 2x / week  PT Duration 6 weeks  PT Treatment/Interventions ADLs/Self Care Home Management;Canalith Repostioning;Cryotherapy;Electrical Stimulation;Moist Heat;Traction;Gait training;Functional mobility training;Therapeutic activities;Therapeutic exercise;Balance training;Neuromuscular re-education;Patient/family education;Manual techniques;Passive range of motion;Dry needling;Vestibular  PT Next Visit Plan continue to address muscle tightness and pain, ? return to use of modalities.  PT Home Exercise Plan [email or web address]  Consulted and Agree with Plan of Care Patient   Patient will benefit from skilled therapeutic  intervention in order to improve the following deficits and impairments:  Decreased activity tolerance, Decreased balance, Decreased range of motion, Decreased strength, Difficulty walking, Dizziness, Impaired perceived functional ability, Impaired sensation, Pain  Visit Diagnosis: Cervicalgia  Dizziness and giddiness  Difficulty in walking, not elsewhere classified     Problem List Patient Active Problem List   Diagnosis Date Noted  . Concussion with no loss of consciousness 03/17/2018  . Elevated blood pressure 07/16/2016  . Anxiety state 07/03/2016  . Neck pain 07/03/2016  . 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 Acid reflux disease 11/27/2014  . Special screening for malignant neoplasms, colon 09/17/2013  . Obesity, unspecified 01/16/2013  . Spinal stenosis in cervical region 01/11/2013  . Multiple thyroid nodules 01/11/2013  . Hand pain 06/16/2012  . Hyperlipidemia 06/16/2012    Willow Ora, PTA, [facility] [address] [place], [place] [address] [phone] 06/02/18, 5:02 PM    End of session, Impression statement and plan, goals addended by primary PT: Rico Junker, PT, DPT 06/06/18    12:05 PM   Name: Kimiko Common MRN: [id number] Date of Birth: 05-Jun-1956

## 2018-06-05 DIAGNOSIS — R7303 Prediabetes: Secondary | ICD-10-CM | POA: Diagnosis not present

## 2018-06-06 NOTE — Addendum Note (Signed)
Addended by: Misty Stanley F on: 06/06/2018 12:10 PM   Modules accepted: Orders

## 2018-06-07 ENCOUNTER — Ambulatory Visit: Payer: BC Managed Care – PPO | Admitting: Physical Therapy

## 2018-06-07 ENCOUNTER — Encounter: Payer: Self-pay | Admitting: Physical Therapy

## 2018-06-07 DIAGNOSIS — M542 Cervicalgia: Secondary | ICD-10-CM | POA: Diagnosis not present

## 2018-06-07 DIAGNOSIS — R42 Dizziness and giddiness: Secondary | ICD-10-CM

## 2018-06-07 DIAGNOSIS — R262 Difficulty in walking, not elsewhere classified: Secondary | ICD-10-CM

## 2018-06-07 NOTE — Patient Instructions (Signed)
Progress x 1 viewing to feet together x 60 seconds each direction.  Try line dancing at home 10-15 minutes a day.

## 2018-06-07 NOTE — Therapy (Signed)
[facility] [address] [place], [place], [address] Phone: [phone]   Fax:  [phone]  Physical Therapy Treatment  Patient Details  Name: Rebecca [name] MRN: [id number] Date of Birth: January 11, 1956 Referring Provider: Rosemarie Ax, MD   Encounter Date: 06/07/2018  PT End of Session - 06/07/18 0845    Visit Number  16    Number of Visits  23 per renewal    Date for PT Re-Evaluation  07/17/18 per renewal    Authorization Type  Sherre Poot, [facility]    PT Start Time  0805    PT Stop Time  0845    PT Time Calculation (min)  40 min    Activity Tolerance  Patient tolerated treatment well    Behavior During Therapy  [facility] for tasks assessed/performed       Past Medical History:  Diagnosis Date  . Hyperlipidemia   . Obesity   . Peritonsillar abscess 02/03/2017  . Thyroid disease    monitoring a nodule 1/9    Past Surgical History:  Procedure Laterality Date  . ABDOMINAL HYSTERECTOMY      There were no vitals filed for this visit.  Subjective Assessment - 06/07/18 0808    Subjective  [place] this [place] and was in the car for 3 hours + driving to work this week causing increase fatigue and neck pain this week.  Wakes up most mornings at 2-3am and takes an hour to fall back asleep or doesn't fall asleep.      Pertinent History  anxiety, obesity, spinal stenosis in cervical region, thyroid nodules, and hyperlipidemia    Limitations  Walking;Standing    Diagnostic tests  CT scan of head and c-spine    Patient Stated Goals  Return to walking (avid walker), relieve L neck pain, return to work    Currently in Pain?  Yes    Pain Score  7     Pain Location  Neck    Pain Orientation  Posterior    Pain Descriptors / Indicators  Headache;Tightness    Pain Type  Chronic pain    Pain Onset  More than a month ago         [facility] Adult PT Treatment/Exercise - 06/07/18 0835      Neck Exercises: Stretches   Upper  Trapezius Stretch  Right;Left;1 rep;30 seconds    Upper Trapezius Stretch Limitations  with 4 second count breathing    Levator Stretch  Right;Left;1 rep;60 seconds    Levator Stretch Limitations  with 4 second count breathing    Other Neck Stretches  scalene and SCM stretch, right and left, 1 rep each side x 30 seconds with breathing      Vestibular Treatment/Exercise - 06/07/18 0816      Vestibular Treatment/Exercise   Vestibular Treatment Provided  Gaze    Gaze Exercises  X1 Viewing Horizontal;X1 Viewing Vertical      X1 Viewing Horizontal   Foot Position  standing feet apart, feet together    Reps  2    Comments  60 seconds each, mild sway      X1 Viewing Vertical   Foot Position  standing feet apart, feet together    Reps  2    Comments  60 seconds; pain and increased dizziness with vertical head nods         Balance Exercises - 06/07/18 0837      Balance Exercises: Standing   Other Standing Exercises  Braiding to L and R down hallway x 4 laps with looking down, looking L, looking R and then with turns to switch sides of hallway x 4 with supervision and intermittent rest breaks          PT Short Term Goals - 06/06/18 1148      PT SHORT TERM GOAL #1   Title  (All STG due by 06/23/2018)  Pt will demonstrate 5-8 degree increase in cervical spine ROM to improve safety with driving    Time  3    Period  Weeks    Status  New    Target Date  06/23/18      PT SHORT TERM GOAL #2   Title  Pt will report decreased motion sensitivity on MSQ to 2-3/5 (mild-moderate)    Baseline  3-4 (moderate to severe)    Time  3    Period  Weeks    Status  New    Target Date  06/23/18      PT SHORT TERM GOAL #3   Title  Pt will demonstrate improved safety with gait as indicated by increase in gait velocity to >/= 3.6 ft/sec    Baseline  3.2 ft/sec    Time  3    Period  Weeks    Status  New    Target Date  06/23/18        PT Long Term Goals - 06/06/18 1154      PT LONG TERM  GOAL #1   Title  (ALL LTG DUE BY 07/17/18)  Pt will demonstrate independence with HEP: balance, vestibular, cervical spine and walking program    Time  6    Period  Weeks    Status  Revised    Target Date  07/17/18      PT LONG TERM GOAL #2   Title  Pt will improve FGA by 2-3 points to decrease falls risk    Baseline  06/02/18: met with score of 25/30 today (inproved from last score of 20/30)    Time  6    Period  Weeks    Status  Revised    Target Date  07/17/18      PT LONG TERM GOAL #3   Title  Pt will improve gait velocity to >/= 3.8 ft/sec    Time  6    Period  Weeks    Status  Revised    Target Date  07/17/18      PT LONG TERM GOAL #4   Title  Pt will improve cervical AROM by 10 degrees (from STG measurements) and report 50% reduction in headache, neck and L shoulder pain    Time  6    Period  Weeks    Status  Revised    Target Date  07/17/18      PT LONG TERM GOAL #5   Title  Pt will report 1-2 (lightheaded/mild) dizziness with movements on MSQ    Time  6    Period  Weeks    Status  Revised    Target Date  07/17/18            Plan - 06/07/18 1306    Clinical Impression Statement  Treatment session began with review of x 1 viewing but transitioned to seated neck stretches with focus on 4 count diaphragmatic breathing due to increased pain, dizziness and shallow breathing with neck accessory muscles.  Following stretches returned to x1 viewing and pt was able to  tolerate progression of x 1 to narrow BOS.  Also progressed dynamic standing balance to braiding with head turns and body turns to simulate line dancing; no reports of dizziness after balance training but pt did demonstrate mild SOB.  Will continue to address and progress towards LTG.    Rehab Potential  Good    PT Frequency  2x / week    PT Duration  6 weeks    PT Treatment/Interventions  ADLs/Self Care Home Management;Canalith Repostioning;Cryotherapy;Electrical Stimulation;Moist Heat;Traction;Gait  training;Functional mobility training;Therapeutic activities;Therapeutic exercise;Balance training;Neuromuscular re-education;Patient/family education;Manual techniques;Passive range of motion;Dry needling;Vestibular    PT Next Visit Plan  Dry needling; review cervical spine stretches and add c-spine stabilization.  Diaphragmatic breathing.  Progress x 1 viewing - narrow BOS/compliant surface.  Dynamic balance training to simulate line dancing    PT Home Exercise Plan  [email or web address]    Consulted and Agree with Plan of Care  Patient       Patient will benefit from skilled therapeutic intervention in order to improve the following deficits and impairments:  Decreased activity tolerance, Decreased balance, Decreased range of motion, Decreased strength, Difficulty walking, Dizziness, Impaired perceived functional ability, Impaired sensation, Pain  Visit Diagnosis: Cervicalgia  Dizziness and giddiness  Difficulty in walking, not elsewhere classified     Problem List Patient Active Problem List   Diagnosis Date Noted  . Concussion with no loss of consciousness 03/17/2018  . Elevated blood pressure 07/16/2016  . Anxiety state 07/03/2016  . Neck pain 07/03/2016  . 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 Acid reflux disease 11/27/2014  . Special screening for malignant neoplasms, colon 09/17/2013  . Obesity, unspecified 01/16/2013  . Spinal stenosis in cervical region 01/11/2013  . Multiple thyroid nodules 01/11/2013  . Hand pain 06/16/2012  . Hyperlipidemia 06/16/2012    Rico Junker, PT, DPT 06/07/18    1:12 PM    [facility] [address] [place], [place], [address] Phone: [phone]   Fax:  [phone]  Name: Rebecca [name] MRN: [id number] Date of Birth: 03/11/1956

## 2018-06-09 ENCOUNTER — Encounter: Payer: BC Managed Care – PPO | Admitting: Physical Therapy

## 2018-06-15 ENCOUNTER — Ambulatory Visit (INDEPENDENT_AMBULATORY_CARE_PROVIDER_SITE_OTHER): Payer: BC Managed Care – PPO | Admitting: Family Medicine

## 2018-06-15 ENCOUNTER — Encounter: Payer: Self-pay | Admitting: Family Medicine

## 2018-06-15 DIAGNOSIS — F411 Generalized anxiety disorder: Secondary | ICD-10-CM | POA: Diagnosis not present

## 2018-06-15 MED ORDER — SERTRALINE HCL 50 MG PO TABS: 50.0000 mg | ORAL_TABLET | Freq: Every day | ORAL | 1 refills | Status: DC

## 2018-06-15 NOTE — Progress Notes (Signed)
Rebecca [name] - 62 y.o. female MRN [id number]  Date of birth: 05-10-1956  SUBJECTIVE:  Including CC & ROS.  Chief Complaint  Patient presents with  . Follow-up    Rebecca [name] is a 62 y.o. female that is here for concussion follow up. She has been doing well, she just returned from [place] this week. Admits to some loss of balance during her vacation. She will be continuing physical  therapy next week. Still feels anxiety when she is driving or riding to work.  Has been taking nortriptyline intermittently.  Has been taking trazodone to help with sleep.  She does sleep but only a few hours per night.  Has not been able to exercise like she normally does.    Review of Systems  Constitutional: Negative for fever.  HENT: Negative for congestion.   Respiratory: Negative for cough.   Cardiovascular: Negative for chest pain.  Gastrointestinal: Negative for abdominal pain.  Musculoskeletal: Negative for back pain.  Skin: Negative for color change.  Neurological: Positive for headaches.  Hematological: Negative for adenopathy.  Psychiatric/Behavioral: The patient is nervous/anxious.     HISTORY: Past Medical, Surgical, Social, and Family History Reviewed & Updated per EMR.   Pertinent Historical Findings include:  Past Medical History:  Diagnosis Date  . Hyperlipidemia   . Obesity   . Peritonsillar abscess 02/03/2017  . Thyroid disease    monitoring a nodule 1/9    Past Surgical History:  Procedure Laterality Date  . ABDOMINAL HYSTERECTOMY      Allergies  Allergen Reactions  . Prednisolone Other (See Comments)    Patient can't remember  Other reaction(s): Delusions (intolerance) Patient can't remember     Family History  Problem Relation Age of Onset  . Cancer Mother        pancreatic cancer  . Diabetes Mother   . Heart disease Mother        rheumatic heart disease  . Hyperlipidemia Mother   . COPD Sister   . Sleep apnea Sister   . Cancer Sister        Lung  Cancer  . Cancer Brother        lung cancer  . Cancer Father        renal, bone  . Diabetes Father   . Hyperlipidemia Father   . Heart attack Maternal Grandfather   . Kidney disease Brother   . Colon cancer Maternal Aunt      Social History   Socioeconomic History  . Marital status: Single    Spouse name: N/A  . Number of children: 0  . Years of education: 18.5  . Highest education level: Not on file  Occupational History  . Occupation: Retired    Fish farm manager: Psychologist, sport and exercise    Comment: [employer] x 30 years  . 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 Financial resource strain: Not on file  . Food insecurity:    Worry: Not on file    Inability: Not on file  . Transportation needs:    Medical: Not on file    Non-medical: Not on file  Tobacco Use  . Smoking status: Never Smoker  . Smokeless tobacco: Never Used  Substance and Sexual Activity  . Alcohol use: Yes    Alcohol/week: 5.0 - 7.0 standard drinks    Types: 5 - 7 Standard drinks or equivalent per week    Comment: 4-5 * week/ 1-2 drinks  . Drug  use: No  . Sexual activity: Yes    Partners: Male    Birth control/protection: Surgical  Lifestyle  . Physical activity:    Days per week: Not on file    Minutes per session: Not on file  . Stress: Not on file  Relationships  . Social connections:    Talks on phone: Not on file    Gets together: Not on file    Attends religious service: Not on file    Active member of club or organization: Not on file    Attends meetings of clubs or organizations: Not on file    Relationship status: Not on file  . Intimate partner violence:    Fear of current or ex partner: Not on file    Emotionally abused: Not on file    Physically abused: Not on file    Forced sexual activity: Not on file  Other Topics Concern  . Not on file  Social History Narrative   Close friend/significant other killed (GSW) 2012.    Retired after 30 years, and took another job (commutes to [place] 4 days/week).     PHYSICAL EXAM:  VS: BP 128/76 (BP Location: Left Arm, Patient Position: Sitting, Cuff Size: Normal)   Pulse 86   Ht 5' 4.2" (1.631 m)   Wt 196 lb (88.9 kg)   SpO2 96%   BMI 33.43 kg/m  Physical Exam Gen: NAD, alert, cooperative with exam, well-appearing ENT: normal lips, normal nasal mucosa,  Eye: normal EOM, normal conjunctiva and lids CV:  no edema, +2 pedal pulses   Resp: no accessory muscle use, non-labored,  Skin: no rashes, no areas of induration  Neuro: normal tone, normal sensation to touch Psych:  normal insight, alert and oriented MSK: normal gait, normal strength      ASSESSMENT & PLAN:   Anxiety state Her symptoms appear to be more associated with anxiety and PTSD in nature.  Does have stress related to work. -Initiate Zoloft -Stop nortriptyline. -Follow-up in 2 weeks.  She may need to make adjustments with her work schedule if it seems to be exacerbating her symptoms.

## 2018-06-15 NOTE — Patient Instructions (Signed)
Good to see you  Please stop the nortriptyline Please start the zoloft and see how it goes  Please follow up with me in 1-2 weeks

## 2018-06-16 NOTE — Assessment & Plan Note (Signed)
Her symptoms appear to be more associated with anxiety and PTSD in nature.  Does have stress related to work. -Initiate Zoloft -Stop nortriptyline. -Follow-up in 2 weeks.  She may need to make adjustments with her work schedule if it seems to be exacerbating her symptoms.

## 2018-06-19 ENCOUNTER — Encounter: Payer: Self-pay | Admitting: Physical Therapy

## 2018-06-19 ENCOUNTER — Ambulatory Visit: Payer: BC Managed Care – PPO | Attending: Family Medicine | Admitting: Physical Therapy

## 2018-06-19 DIAGNOSIS — R262 Difficulty in walking, not elsewhere classified: Secondary | ICD-10-CM | POA: Insufficient documentation

## 2018-06-19 DIAGNOSIS — M542 Cervicalgia: Secondary | ICD-10-CM | POA: Insufficient documentation

## 2018-06-19 DIAGNOSIS — R42 Dizziness and giddiness: Secondary | ICD-10-CM | POA: Diagnosis present

## 2018-06-19 NOTE — Therapy (Signed)
[place], [place], [address] Phone: [phone]   Fax:  [phone]  Physical Therapy Treatment  Patient Details  Name: Rebecca [name] MRN: [id number] Date of Birth: [birth year] Referring Provider: Rosemarie Ax, MD   Encounter Date: 06/19/2018  PT End of Session - 06/19/18 1522    Visit Number  17    Number of Visits  23    Date for PT Re-Evaluation  07/17/18    Authorization Type  [employer], [employer]    PT Start Time  1511   pt 10 min late   PT Stop Time  1544    PT Time Calculation (min)  33 min    Activity Tolerance  Patient tolerated treatment well    Behavior During Therapy  [facility] for tasks assessed/performed       Past Medical History:  Diagnosis Date  . Hyperlipidemia   . Obesity   . Peritonsillar abscess 02/03/2017  . Thyroid disease    monitoring a nodule 1/9    Past Surgical History:  Procedure Laterality Date  . ABDOMINAL HYSTERECTOMY      There were no vitals filed for this visit.  Subjective Assessment - 06/19/18 1513    Subjective  "I just came from [place]. I am okay. I have some pain from the drive and working."    Currently in Pain?  Yes    Pain Score  7     Pain Location  Neck    Pain Orientation  Posterior    Pain Descriptors / Indicators  Aching    Pain Type  Chronic pain    Pain Radiating Towards  Pain radiates into L shoulder    Pain Onset  More than a month ago    Pain Frequency  Constant    Aggravating Factors   driving, sitting at computer    Pain Relieving Factors  rest                       [facility] Adult PT Treatment/Exercise - 06/19/18 0001      Neck Exercises: Seated   Cervical Rotation  10 reps   cues for correct technique needed     Manual Therapy   Manual Therapy  Soft tissue mobilization    Manual therapy comments  skilled palaption of trigger points during TPDN; IASTM of L upper trap and levator    Joint Mobilization  L 1st rib  mobilization in prone       Trigger Point Dry Needling - 06/19/18 1545    Consent Given?  Yes    Education Handout Provided  No    Muscles Treated Upper Body  Upper trapezius;Levator scapulae   cervical paraspninals    Upper Trapezius Response  Twitch reponse elicited;Palpable increased muscle length    Levator Scapulae Response  Twitch response elicited;Palpable increased muscle length           PT Education - 06/19/18 1554    Education provided  Yes    Education Details  TPDN and experiencing increased soreness after 24-48 hours and relief follows    Person(s) Educated  Patient    Methods  Explanation    Comprehension  Verbalized understanding       PT Short Term Goals - 06/06/18 1148      PT SHORT TERM GOAL #1   Title  (All STG due by 06/23/2018)  Pt will demonstrate 5-8 degree increase in cervical spine ROM to improve  safety with driving    Time  3    Period  Weeks    Status  New    Target Date  06/23/18      PT SHORT TERM GOAL #2   Title  Pt will report decreased motion sensitivity on MSQ to 2-3/5 (mild-moderate)    Baseline  3-4 (moderate to severe)    Time  3    Period  Weeks    Status  New    Target Date  06/23/18      PT SHORT TERM GOAL #3   Title  Pt will demonstrate improved safety with gait as indicated by increase in gait velocity to >/= 3.6 ft/sec    Baseline  3.2 ft/sec    Time  3    Period  Weeks    Status  New    Target Date  06/23/18        PT Long Term Goals - 06/06/18 1154      PT LONG TERM GOAL #1   Title  (ALL LTG DUE BY 07/17/18)  Pt will demonstrate independence with HEP: balance, vestibular, cervical spine and walking program    Time  6    Period  Weeks    Status  Revised    Target Date  07/17/18      PT LONG TERM GOAL #2   Title  Pt will improve FGA by 2-3 points to decrease falls risk    Baseline  06/02/18: met with score of 25/30 today (inproved from last score of 20/30)    Time  6    Period  Weeks    Status  Revised     Target Date  07/17/18      PT LONG TERM GOAL #3   Title  Pt will improve gait velocity to >/= 3.8 ft/sec    Time  6    Period  Weeks    Status  Revised    Target Date  07/17/18      PT LONG TERM GOAL #4   Title  Pt will improve cervical AROM by 10 degrees (from STG measurements) and report 50% reduction in headache, neck and L shoulder pain    Time  6    Period  Weeks    Status  Revised    Target Date  07/17/18      PT LONG TERM GOAL #5   Title  Pt will report 1-2 (lightheaded/mild) dizziness with movements on MSQ    Time  6    Period  Weeks    Status  Revised    Target Date  07/17/18            Plan - 06/19/18 1549    Clinical Impression Statement  TPDN performed to L upper trap, levator scapula, and cervical paraspinals. IASTM performed following dry needling in addition to 1st rib mobilizations on the L. Pt reports some soreness following treatment, informed her this is normal following needling and should feel better within 24-48 hours.     PT Treatment/Interventions  ADLs/Self Care Home Management;Canalith Repostioning;Cryotherapy;Electrical Stimulation;Moist Heat;Traction;Gait training;Functional mobility training;Therapeutic activities;Therapeutic exercise;Balance training;Neuromuscular re-education;Patient/family education;Manual techniques;Passive range of motion;Dry needling;Vestibular    PT Next Visit Plan  review cervical spine stretches and add c-spine stabilization.  Diaphragmatic breathing.  Progress x 1 viewing - narrow BOS/compliant surface.  Dynamic balance training to simulate line dancing    PT Home Exercise Plan  [id number]    Consulted and Agree with Plan of Care  Patient       Patient will benefit from skilled therapeutic intervention in order to improve the following deficits and impairments:  Decreased activity tolerance, Decreased balance, Decreased range of motion, Decreased strength, Difficulty walking, Dizziness, Impaired perceived functional ability,  Impaired sensation, Pain  Visit Diagnosis: Cervicalgia  Dizziness and giddiness  Difficulty in walking, not elsewhere classified     Problem List Patient Active Problem List   Diagnosis Date Noted  . Concussion with no loss of consciousness 03/17/2018  . Elevated blood pressure 07/16/2016  . Anxiety state 07/03/2016  . Neck pain 07/03/2016  . 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 Acid reflux disease 11/27/2014  . Special screening for malignant neoplasms, colon 09/17/2013  . Obesity, unspecified 01/16/2013  . Spinal stenosis in cervical region 01/11/2013  . Multiple thyroid nodules 01/11/2013  . Hand pain 06/16/2012  . Hyperlipidemia 06/16/2012   Worthy Flank, SPT 06/19/18 3:55 PM   [facility] [address] [place], [place], [address] Phone: [phone]   Fax:  [phone]  Name: Adrina Armijo MRN: [id number] Date of Birth: 02-16-1956

## 2018-06-23 ENCOUNTER — Encounter: Payer: Self-pay | Admitting: Physical Therapy

## 2018-06-23 ENCOUNTER — Ambulatory Visit: Payer: BC Managed Care – PPO | Admitting: Physical Therapy

## 2018-06-23 DIAGNOSIS — E782 Mixed hyperlipidemia: Secondary | ICD-10-CM | POA: Diagnosis not present

## 2018-06-23 DIAGNOSIS — E669 Obesity, unspecified: Secondary | ICD-10-CM | POA: Diagnosis not present

## 2018-06-23 DIAGNOSIS — M542 Cervicalgia: Secondary | ICD-10-CM | POA: Diagnosis not present

## 2018-06-23 DIAGNOSIS — R42 Dizziness and giddiness: Secondary | ICD-10-CM

## 2018-06-23 DIAGNOSIS — R262 Difficulty in walking, not elsewhere classified: Secondary | ICD-10-CM

## 2018-06-23 NOTE — Therapy (Signed)
[facility] [address] [place], [place], [address] Phone: [phone]   Fax:  [phone]  Physical Therapy Treatment  Patient Details  Name: Rebecca [name] MRN: [id number] Date of Birth: 05/03/1956 Referring Provider: Rosemarie Ax, MD   Encounter Date: 06/23/2018  PT End of Session - 06/23/18 1257    Visit Number  18    Number of Visits  23    Date for PT Re-Evaluation  07/17/18    Authorization Type  [employer] Cross, UHC    PT Start Time  0930    PT Stop Time  1019    PT Time Calculation (min)  49 min    Activity Tolerance  Patient limited by pain    Behavior During Therapy  [facility] for tasks assessed/performed       Past Medical History:  Diagnosis Date  . Hyperlipidemia   . Obesity   . Peritonsillar abscess 02/03/2017  . Thyroid disease    monitoring a nodule 1/9    Past Surgical History:  Procedure Laterality Date  . ABDOMINAL HYSTERECTOMY      There were no vitals filed for this visit.  Subjective Assessment - 06/23/18 0936    Subjective  Went on vacation in [place] and had one fall down the stairs; reports getting dizzy but also may have had too many mimosas.  Went to dry needling and that went well but was a little sore afterwards.  Is having more neck pain the past few days due to stress at work, lots of typing and driving.  Getting ready to go to a conference this week.  Is going to call the concussion MD about work and the increase in neck pain and headaches.      Pertinent History  anxiety, obesity, spinal stenosis in cervical region, thyroid nodules, and hyperlipidemia    Limitations  Walking;Standing    Diagnostic tests  CT scan of head and c-spine    Patient Stated Goals  Return to walking (avid walker), relieve L neck pain, return to work    Currently in Pain?  Yes    Pain Score  8     Pain Location  Neck    Pain Orientation  Posterior    Pain Descriptors / Indicators  Sore;Aching    Pain  Type  Chronic pain    Pain Onset  More than a month ago         [facility] PT Assessment - 06/23/18 0946      AROM   Overall AROM   Deficits;Due to pain    AROM Assessment Site  Cervical    Cervical Flexion  25    Cervical Extension  30    Cervical - Right Side Bend  20    Cervical - Left Side Bend  25    Cervical - Right Rotation  35    Cervical - Left Rotation  35      Standardized Balance Assessment   Standardized Balance Assessment  10 meter walk test    10 Meter Walk  10.18 seconds or 3.22 ft/sec      Functional Gait  Assessment   Gait assessed   Yes    Gait Level Surface  Walks 20 ft in less than 7 sec but greater than 5.5 sec, uses assistive device, slower speed, mild gait deviations, or deviates 6-10 in outside of the 12 in walkway width.    Change in Gait Speed  Able to smoothly change walking speed  without loss of balance or gait deviation. Deviate no more than 6 in outside of the 12 in walkway width.    Gait with Horizontal Head Turns  Performs head turns smoothly with no change in gait. Deviates no more than 6 in outside 12 in walkway width    Gait with Vertical Head Turns  Performs head turns with no change in gait. Deviates no more than 6 in outside 12 in walkway width.    Gait and Pivot Turn  Pivot turns safely within 3 sec and stops quickly with no loss of balance.    Step Over Obstacle  Is able to step over one shoe box (4.5 in total height) without changing gait speed. No evidence of imbalance.    Gait with Narrow Base of Support  Is able to ambulate for 10 steps heel to toe with no staggering.    Gait with Eyes Closed  Walks 20 ft, uses assistive device, slower speed, mild gait deviations, deviates 6-10 in outside 12 in walkway width. Ambulates 20 ft in less than 9 sec but greater than 7 sec.    Ambulating Backwards  Walks 20 ft, uses assistive device, slower speed, mild gait deviations, deviates 6-10 in outside 12 in walkway width.    Steps  Alternating feet, no rail.     Total Score  26    FGA comment:  26/30 = low risk for falls         Vestibular Assessment - 06/23/18 0953      Positional Sensitivities   Nose to Right Knee  No dizziness    Right Knee to Sitting  No dizziness    Nose to Left Knee  No dizziness    Left Knee to Sitting  No dizziness    Head Turning x 5  No dizziness    Head Nodding x 5  No dizziness   pain in neck/head but no dizziness               Vestibular Treatment/Exercise - 06/23/18 1009      Vestibular Treatment/Exercise   Vestibular Treatment Provided  Gaze    Gaze Exercises  X1 Viewing Horizontal;X1 Viewing Vertical      X1 Viewing Horizontal   Foot Position  feet together    Reps  2    Comments  60 seconds      X1 Viewing Vertical   Foot Position  feet together    Reps  2    Comments  60 seconds            PT Education - 06/23/18 1256    Education provided  Yes    Education Details  progress towards goals and areas to continue to focus on    Person(s) Educated  Patient    Methods  Explanation    Comprehension  Verbalized understanding       PT Short Term Goals - 06/23/18 0942      PT SHORT TERM GOAL #1   Title  (All STG due by 06/23/2018)  Pt will demonstrate 5-8 degree increase in cervical spine ROM to improve safety with driving    Baseline  partially met; see flow sheets    Time  3    Period  Weeks    Status  Partially Met      PT SHORT TERM GOAL #2   Title  Pt will report decreased motion sensitivity on MSQ to 2-3/5 (mild-moderate)    Baseline  0 overall  Time  3    Period  Weeks    Status  Achieved      PT SHORT TERM GOAL #3   Title  Pt will demonstrate improved safety with gait as indicated by increase in gait velocity to >/= 3.6 ft/sec    Baseline  3.2 ft/sec - remained the same    Time  3    Period  Weeks    Status  Not Met        PT Long Term Goals - 06/23/18 1255      PT LONG TERM GOAL #1   Title  (ALL LTG DUE BY 07/17/18)  Pt will demonstrate independence  with HEP: balance, vestibular, cervical spine and walking program    Time  6    Period  Weeks    Status  Revised      PT LONG TERM GOAL #2   Title  Pt will improve FGA by 2-3 points to decrease falls risk    Baseline  26/30    Time  6    Period  Weeks    Status  Revised      PT LONG TERM GOAL #3   Title  Pt will improve gait velocity to >/= 3.6 ft/sec    Baseline  3.2    Time  6    Period  Weeks    Status  Revised      PT LONG TERM GOAL #4   Title  Pt will improve cervical AROM by 10 degrees (from STG measurements) and report 50% reduction in headache, neck and L shoulder pain    Time  6    Period  Weeks    Status  Revised      PT LONG TERM GOAL #5   Title  Pt will report 1-2 (lightheaded/mild) dizziness with movements on MSQ    Time  6    Period  Weeks    Status  Achieved            Plan - 06/23/18 1257    Clinical Impression Statement  Treatment session focused on assessment of progress towards STG following vacation, return to work and dry needling.  Despite pt reporting increased tension in neck and increased pain she has maintained most ROM gains for flexion and rotation and demonstrates significant in extension and sidebending.  Pt also demonstrates resolution of motion sensitivity to head nods, turns and forward bends and slight improvements in balance during dynamic gait.  Pt continues to present with impairments in balance when visual input is restricted, when stepping over obstacles and when ambulating at faster speeds.  Pt continues to be most limited by pain and limited ROM in neck.  Will continue to address in order to progress towards LTG.  LTG revised.    PT Treatment/Interventions  ADLs/Self Care Home Management;Canalith Repostioning;Cryotherapy;Electrical Stimulation;Moist Heat;Traction;Gait training;Functional mobility training;Therapeutic activities;Therapeutic exercise;Balance training;Neuromuscular re-education;Patient/family education;Manual  techniques;Passive range of motion;Dry needling;Vestibular    PT Next Visit Plan  review cervical spine stretches and add c-spine stabilization.  Diaphragmatic breathing.  Progress x 1 viewing - narrow BOS/compliant surface.  Dynamic balance training to simulate line dancing    PT Home Exercise Plan  [id number]    Consulted and Agree with Plan of Care  Patient       Patient will benefit from skilled therapeutic intervention in order to improve the following deficits and impairments:  Decreased activity tolerance, Decreased balance, Decreased range of motion, Decreased strength, Difficulty walking, Dizziness, Impaired perceived  functional ability, Impaired sensation, Pain  Visit Diagnosis: Cervicalgia  Dizziness and giddiness  Difficulty in walking, not elsewhere classified     Problem List Patient Active Problem List   Diagnosis Date Noted  . Concussion with no loss of consciousness 03/17/2018  . Elevated blood pressure 07/16/2016  . Anxiety state 07/03/2016  . Neck pain 07/03/2016  . 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 Acid reflux disease 11/27/2014  . Special screening for malignant neoplasms, colon 09/17/2013  . Obesity, unspecified 01/16/2013  . Spinal stenosis in cervical region 01/11/2013  . Multiple thyroid nodules 01/11/2013  . Hand pain 06/16/2012  . Hyperlipidemia 06/16/2012   Rico Junker, PT, DPT 06/23/18    1:03 PM    [facility] [address] [place], [place], [address] Phone: [phone]   Fax:  [phone]  Name: Rebecca [name] MRN: [id number] Date of Birth: [birth year]

## 2018-06-30 ENCOUNTER — Encounter: Payer: Self-pay | Admitting: Physical Therapy

## 2018-06-30 ENCOUNTER — Ambulatory Visit (HOSPITAL_COMMUNITY): Payer: BC Managed Care – PPO | Admitting: Psychiatry

## 2018-06-30 ENCOUNTER — Ambulatory Visit: Payer: BC Managed Care – PPO | Admitting: Physical Therapy

## 2018-06-30 DIAGNOSIS — M542 Cervicalgia: Secondary | ICD-10-CM

## 2018-06-30 DIAGNOSIS — R42 Dizziness and giddiness: Secondary | ICD-10-CM

## 2018-06-30 DIAGNOSIS — R262 Difficulty in walking, not elsewhere classified: Secondary | ICD-10-CM

## 2018-06-30 NOTE — Therapy (Signed)
[place], [place], [address] Phone: [phone]   Fax:  [phone]  Physical Therapy Treatment  Patient Details  Name: Rebecca [name] MRN: [id number] Date of Birth: Apr 17, 1956 Referring Provider: Rosemarie Ax, MD   Encounter Date: 06/30/2018  PT End of Session - 06/30/18 0848    Visit Number  19    Number of Visits  23    Date for PT Re-Evaluation  07/17/18    Authorization Type  [employer], [facility]    PT Start Time  [id number]    PT Stop Time  [id number]    PT Time Calculation (min)  54 min    Activity Tolerance  Patient tolerated treatment well    Behavior During Therapy  [facility] for tasks assessed/performed       Past Medical History:  Diagnosis Date  . Hyperlipidemia   . Obesity   . Peritonsillar abscess 02/03/2017  . Thyroid disease    monitoring a nodule 1/9    Past Surgical History:  Procedure Laterality Date  . ABDOMINAL HYSTERECTOMY      There were no vitals filed for this visit.  Subjective Assessment - 06/30/18 0848    Subjective  "I am feeling okay, the DN last time was sore but it did help with the mobility of the neck"     Patient Stated Goals  Return to walking (avid walker), relieve L neck pain, return to work    Currently in Pain?  Yes    Pain Score  6     Pain Location  Neck    Pain Orientation  Right    Pain Type  Chronic pain    Pain Onset  More than a month ago    Pain Frequency  Intermittent    Aggravating Factors   N/A    Pain Relieving Factors  resting                       [facility] Adult PT Treatment/Exercise - 06/30/18 0858      Neck Exercises: Seated   Neck Retraction  10 reps   holding 5 sec ea   Other Seated Exercise  Rows 2 x 10 with red theraband, and lower trap activation with elbows propped on bolster 2 x 10 with yellow band    Other Seated Exercise  thoracic rotation with arms cross chest bil 2 x 10      Neck Exercises: Supine   Other Supine  Exercise  ceiling punches 2 x with rhythmic stabilization bil      Moist Heat Therapy   Number Minutes Moist Heat  10 Minutes    Moist Heat Location  Cervical   in supine     Manual Therapy   Manual Therapy  Taping    Manual therapy comments  skilled palaption of trigger points during TPDN; IASTM of L upper trap and levator    Joint Mobilization  L 1st rib grade 3, and T1-T8 PA grade 4     Soft tissue mobilization  upper trap, levator scapulae and splenus capitus IASTM     McConnell  L upper trap inhibition taping      Neck Exercises: Stretches   Upper Trapezius Stretch  Left;2 reps;Right;30 seconds       Trigger Point Dry Needling - 06/30/18 0851    Consent Given?  Yes    Education Handout Provided  No   given previously   Upper Trapezius Response  Twitch reponse elicited;Palpable increased muscle length    Levator Scapulae Response  Twitch response elicited;Palpable increased muscle length    Longissimus Response  Twitch response elicited;Palpable increased muscle length   cervical paraspinals bil          PT Education - 06/30/18 0931    Education provided  Yes    Education Details  beneftis of inhibition tape and length of wear.    before and during application   Person(s) Educated  Patient    Methods  Explanation;Verbal cues    Comprehension  Verbalized understanding;Verbal cues required       PT Short Term Goals - 06/23/18 0942      PT SHORT TERM GOAL #1   Title  (All STG due by 06/23/2018)  Pt will demonstrate 5-8 degree increase in cervical spine ROM to improve safety with driving    Baseline  partially met; see flow sheets    Time  3    Period  Weeks    Status  Partially Met      PT SHORT TERM GOAL #2   Title  Pt will report decreased motion sensitivity on MSQ to 2-3/5 (mild-moderate)    Baseline  0 overall    Time  3    Period  Weeks    Status  Achieved      PT SHORT TERM GOAL #3   Title  Pt will demonstrate improved safety with gait as indicated  by increase in gait velocity to >/= 3.6 ft/sec    Baseline  3.2 ft/sec - remained the same    Time  3    Period  Weeks    Status  Not Met        PT Long Term Goals - 06/23/18 1255      PT LONG TERM GOAL #1   Title  (ALL LTG DUE BY 07/17/18)  Pt will demonstrate independence with HEP: balance, vestibular, cervical spine and walking program    Time  6    Period  Weeks    Status  Revised      PT LONG TERM GOAL #2   Title  Pt will improve FGA by 2-3 points to decrease falls risk    Baseline  26/30    Time  6    Period  Weeks    Status  Revised      PT LONG TERM GOAL #3   Title  Pt will improve gait velocity to >/= 3.6 ft/sec    Baseline  3.2    Time  6    Period  Weeks    Status  Revised      PT LONG TERM GOAL #4   Title  Pt will improve cervical AROM by 10 degrees (from STG measurements) and report 50% reduction in headache, neck and L shoulder pain    Time  6    Period  Weeks    Status  Revised      PT LONG TERM GOAL #5   Title  Pt will report 1-2 (lightheaded/mild) dizziness with movements on MSQ    Time  6    Period  Weeks    Status  Achieved            Plan - 06/30/18 0903    Clinical Impression Statement  pt reports improvement in mobility since previous DN session and mild improvement in pain to 7/10. continued TPDN with bil upper trap, levator scap and cervical paraspinals followed with rib and  thoracic mobs. trialed inhibition taping for the L shoulder which she reported relief of pain and tightness. continued shoulder and scapular stability. MHP end of session and pt reported reduced tightness.     PT Treatment/Interventions  ADLs/Self Care Home Management;Canalith Repostioning;Cryotherapy;Electrical Stimulation;Moist Heat;Traction;Gait training;Functional mobility training;Therapeutic activities;Therapeutic exercise;Balance training;Neuromuscular re-education;Patient/family education;Manual techniques;Passive range of motion;Dry needling;Vestibular    PT Next  Visit Plan  review cervical spine stretches and add c-spine stabilization.  Diaphragmatic breathing.  Progress x 1 viewing - narrow BOS/compliant surface.  Dynamic balance training to simulate line dancing    Consulted and Agree with Plan of Care  Patient       Patient will benefit from skilled therapeutic intervention in order to improve the following deficits and impairments:  Decreased activity tolerance, Decreased balance, Decreased range of motion, Decreased strength, Difficulty walking, Dizziness, Impaired perceived functional ability, Impaired sensation, Pain  Visit Diagnosis: Cervicalgia  Dizziness and giddiness  Difficulty in walking, not elsewhere classified     Problem List Patient Active Problem List   Diagnosis Date Noted  . Concussion with no loss of consciousness 03/17/2018  . Elevated blood pressure 07/16/2016  . Anxiety state 07/03/2016  . Neck pain 07/03/2016  . 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 Acid reflux disease 11/27/2014  . Special screening for malignant neoplasms, colon 09/17/2013  . Obesity, unspecified 01/16/2013  . Spinal stenosis in cervical region 01/11/2013  . Multiple thyroid nodules 01/11/2013  . Hand pain 06/16/2012  . Hyperlipidemia 06/16/2012   Starr Lake PT, DPT, LAT, ATC  06/30/18  9:32 AM      [facility] [address] [place], [place], [address] Phone: [phone]   Fax:  [phone]  Name: Kamiah Fite MRN: [id number] Date of Birth: Apr 05, 1956

## 2018-07-05 ENCOUNTER — Ambulatory Visit: Payer: BC Managed Care – PPO | Admitting: Physical Therapy

## 2018-07-07 ENCOUNTER — Encounter: Payer: Self-pay | Admitting: Physical Therapy

## 2018-07-07 ENCOUNTER — Ambulatory Visit: Payer: BC Managed Care – PPO | Admitting: Physical Therapy

## 2018-07-07 ENCOUNTER — Telehealth: Payer: Self-pay | Admitting: Family Medicine

## 2018-07-07 ENCOUNTER — Encounter: Payer: Self-pay | Admitting: Family Medicine

## 2018-07-07 ENCOUNTER — Ambulatory Visit (INDEPENDENT_AMBULATORY_CARE_PROVIDER_SITE_OTHER): Payer: BC Managed Care – PPO | Admitting: Family Medicine

## 2018-07-07 DIAGNOSIS — M542 Cervicalgia: Secondary | ICD-10-CM

## 2018-07-07 DIAGNOSIS — R42 Dizziness and giddiness: Secondary | ICD-10-CM

## 2018-07-07 DIAGNOSIS — R262 Difficulty in walking, not elsewhere classified: Secondary | ICD-10-CM

## 2018-07-07 DIAGNOSIS — F411 Generalized anxiety disorder: Secondary | ICD-10-CM | POA: Diagnosis not present

## 2018-07-07 MED ORDER — SERTRALINE HCL 50 MG PO TABS: 50.0000 mg | ORAL_TABLET | Freq: Every day | ORAL | 1 refills | Status: DC

## 2018-07-07 NOTE — Assessment & Plan Note (Signed)
Feels most of her symptoms are related to anxiety or PTSD at this point.  - zoloft.  - will provide note to write out of work for the next two weeks. Will follow up at that time and can increase zoloft.  - psych canceled and reschedule next week.

## 2018-07-07 NOTE — Telephone Encounter (Signed)
Copied from [place] [phone]. Topic: Quick Communication - See Telephone Encounter >> Jul 07, 2018  3:14 PM Blase Mess A wrote: CRM for notification. See Telephone encounter for: 07/07/18.  Patient was calling to give fax number for her medical note for today to be sent to [place] [phone] [place] [facility] [employer] [employer]

## 2018-07-07 NOTE — Therapy (Addendum)
[place] [address] [place], [place], [address] Phone: [phone]   Fax:  [phone]  Physical Therapy Treatment  Patient Details  Name: Rebecca [name] MRN: [id number] Date of Birth: 13-Jun-1956 Referring Provider: Rosemarie Ax, MD   Encounter Date: 07/07/2018  PT End of Session - 07/07/18 1245    Visit Number  20    Number of Visits  23    Date for PT Re-Evaluation  07/17/18    Authorization Type  [employer], [employer]    PT Start Time  [phone]    PT Stop Time  1017    PT Time Calculation (min)  45 min    Activity Tolerance  Patient tolerated treatment well    Behavior During Therapy  [facility] for tasks assessed/performed       Past Medical History:  Diagnosis Date  . Hyperlipidemia   . Obesity   . Peritonsillar abscess 02/03/2017  . Thyroid disease    monitoring a nodule 1/9    Past Surgical History:  Procedure Laterality Date  . ABDOMINAL HYSTERECTOMY      There were no vitals filed for this visit.  Subjective Assessment - 07/07/18 0934    Subjective  Conference in [place] caused her to have increased tension in neck.  Had an intense DN session but the kinesiotape worked really well, kept the tape on for 4 days because it helped so much.    Pertinent History  anxiety, obesity, spinal stenosis in cervical region, thyroid nodules, and hyperlipidemia    Limitations  Walking;Standing    Patient Stated Goals  Return to walking (avid walker), relieve L neck pain, return to work    Currently in Pain?  Yes    Pain Onset  More than a month ago                       [facility] Adult PT Treatment/Exercise - 07/07/18 1011      Neuro Re-ed    Neuro Re-ed Details   Brock string exercise in sitting to focus on convergence and divergence of eyes.           07/07/18 0945  Vestibular Treatment/Exercise  Vestibular Treatment Provided Gaze  Gaze Exercises X1 Viewing Horizontal;X1 Viewing Vertical  X1  Viewing Horizontal  Foot Position feet staggered, R/L foot forwards.  Feet together for busy background  Reps 3  Comments decreased to 30 seconds  X1 Viewing Vertical  Foot Position feet staggered, R/L foot forwards.  Feet together for busy background  Reps 3  Comments decreased to 30 seconds          PT Education - 07/07/18 1243    Education provided  Yes    Education Details  x1 viewing updated; brock string for convergence    Person(s) Educated  Patient    Methods  Explanation;Demonstration;Handout    Comprehension  Verbalized understanding;Returned demonstration       PT Short Term Goals - 06/23/18 0942      PT SHORT TERM GOAL #1   Title  (All STG due by 06/23/2018)  Pt will demonstrate 5-8 degree increase in cervical spine ROM to improve safety with driving    Baseline  partially met; see flow sheets    Time  3    Period  Weeks    Status  Partially Met      PT SHORT TERM GOAL #2   Title  Pt will report decreased motion sensitivity on  MSQ to 2-3/5 (mild-moderate)    Baseline  0 overall    Time  3    Period  Weeks    Status  Achieved      PT SHORT TERM GOAL #3   Title  Pt will demonstrate improved safety with gait as indicated by increase in gait velocity to >/= 3.6 ft/sec    Baseline  3.2 ft/sec - remained the same    Time  3    Period  Weeks    Status  Not Met        PT Long Term Goals - 06/23/18 1255      PT LONG TERM GOAL #1   Title  (ALL LTG DUE BY 07/17/18)  Pt will demonstrate independence with HEP: balance, vestibular, cervical spine and walking program    Time  6    Period  Weeks    Status  Revised      PT LONG TERM GOAL #2   Title  Pt will improve FGA by 2-3 points to decrease falls risk    Baseline  26/30    Time  6    Period  Weeks    Status  Revised      PT LONG TERM GOAL #3   Title  Pt will improve gait velocity to >/= 3.6 ft/sec    Baseline  3.2    Time  6    Period  Weeks    Status  Revised      PT LONG TERM GOAL #4   Title  Pt  will improve cervical AROM by 10 degrees (from STG measurements) and report 50% reduction in headache, neck and L shoulder pain    Time  6    Period  Weeks    Status  Revised      PT LONG TERM GOAL #5   Title  Pt will report 1-2 (lightheaded/mild) dizziness with movements on MSQ    Time  6    Period  Weeks    Status  Achieved            Plan - 07/07/18 1246    Clinical Impression Statement  Due to effectiveness of inhibition taping, therapist reapplied new kinesiotape for L shoulder.  Continued to review and progress gaze adaptation by changing BOS and adding busy background and added brock string in sitting to address convergence insufficiency.  Pt tolerated x 1 viewing well with no significant increase in symptoms but only able to tolerate 3-4 repetitions of brock string.  Pt reports she will be traveling more for training and is nervous about travel, setting up and presenting and is concerned she will get dizzy with all of the exertion and fatigue.  Encouraged pt to continue with trainings and find ways to conserve energy on trips: leave presentation room during lunch, only allow 5-10 minutes for questions afterwards, rest stops during driving, etc.  Will continue to address in order to progress towards LTG.      PT Treatment/Interventions  ADLs/Self Care Home Management;Canalith Repostioning;Cryotherapy;Electrical Stimulation;Moist Heat;Traction;Gait training;Functional mobility training;Therapeutic activities;Therapeutic exercise;Balance training;Neuromuscular re-education;Patient/family education;Manual techniques;Passive range of motion;Dry needling;Vestibular    PT Next Visit Plan  review cervical spine stretches and add c-spine stabilization.  Begin to finalize HEP for pt to do at home and when she travels.  Check LTG on 9/10 - send to me for D/C    PT Home Exercise Plan  [email or web address]    Consulted and Agree with Plan of Care  Patient  Patient will benefit from skilled therapeutic  intervention in order to improve the following deficits and impairments:  Decreased activity tolerance, Decreased balance, Decreased range of motion, Decreased strength, Difficulty walking, Dizziness, Impaired perceived functional ability, Impaired sensation, Pain  Visit Diagnosis: Cervicalgia  Dizziness and giddiness  Difficulty in walking, not elsewhere classified     Problem List Patient Active Problem List   Diagnosis Date Noted  . Concussion with no loss of consciousness 03/17/2018  . Elevated blood pressure 07/16/2016  . Anxiety state 07/03/2016  . Neck pain 07/03/2016  . 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 Acid reflux disease 11/27/2014  . Special screening for malignant neoplasms, colon 09/17/2013  . Obesity, unspecified 01/16/2013  . Spinal stenosis in cervical region 01/11/2013  . Multiple thyroid nodules 01/11/2013  . Hand pain 06/16/2012  . Hyperlipidemia 06/16/2012    Rico Junker, PT, DPT 07/07/18    12:54 PM    [place] [address] [place], [place], [address] Phone: [phone]   Fax:  [phone]  Name: Rebecca [name] MRN: [id number] Date of Birth: [birth year]

## 2018-07-07 NOTE — Progress Notes (Signed)
Rebecca [name] - 62 y.o. female MRN [id number]  Date of birth: Jan 22, 1956  SUBJECTIVE:  Including CC & ROS.  Chief Complaint  Patient presents with  . Follow-up    Rebecca [name] is a 62 y.o. female that is here today for concussion follow up. She has been back to work four days a week. She has been having headaches and nausea. She has been in PT twice a week. She feels her symptoms are worse with driving to work. Work has been has been stressful. She has been continuing physical therapy.  Loss than Zoloft and has not taken it yet.  Has been taking nortriptyline intermittently.   Review of Systems  Constitutional: Negative for fever.  HENT: Negative for congestion.   Respiratory: Negative for cough.   Gastrointestinal: Negative for abdominal pain.  Musculoskeletal: Negative for back pain.  Skin: Negative for color change.  Neurological: Positive for dizziness and headaches.  Hematological: Negative for adenopathy.  Psychiatric/Behavioral: Negative for agitation.    HISTORY: Past Medical, Surgical, Social, and Family History Reviewed & Updated per EMR.   Pertinent Historical Findings include:  Past Medical History:  Diagnosis Date  . Hyperlipidemia   . Obesity   . Peritonsillar abscess 02/03/2017  . Thyroid disease    monitoring a nodule 1/9    Past Surgical History:  Procedure Laterality Date  . ABDOMINAL HYSTERECTOMY      Allergies  Allergen Reactions  . Prednisolone Other (See Comments)    Patient can't remember  Other reaction(s): Delusions (intolerance) Patient can't remember     Family History  Problem Relation Age of Onset  . Cancer Mother        pancreatic cancer  . Diabetes Mother   . Heart disease Mother        rheumatic heart disease  . Hyperlipidemia Mother   . COPD Sister   . Sleep apnea Sister   . Cancer Sister        Lung Cancer  . Cancer Brother        lung cancer  . Cancer Father        renal, bone  . Diabetes Father   .  Hyperlipidemia Father   . Heart attack Maternal Grandfather   . Kidney disease Brother   . Colon cancer Maternal Aunt      Social History   Socioeconomic History  . Marital status: Single    Spouse name: N/A  . Number of children: 0  . Years of education: 18.5  . Highest education level: Not on file  Occupational History  . Occupation: Retired    Fish farm manager: Psychologist, sport and exercise    Comment: [employer] x 30 years  . 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 Financial resource strain: Not on file  . Food insecurity:    Worry: Not on file    Inability: Not on file  . Transportation needs:    Medical: Not on file    Non-medical: Not on file  Tobacco Use  . Smoking status: Never Smoker  . Smokeless tobacco: Never Used  Substance and Sexual Activity  . Alcohol use: Yes    Alcohol/week: 5.0 - 7.0 standard drinks    Types: 5 - 7 Standard drinks or equivalent per week    Comment: 4-5 * week/ 1-2 drinks  . Drug use: No  . Sexual activity: Yes    Partners: Male    Birth control/protection: Surgical  Lifestyle  .  Physical activity:    Days per week: Not on file    Minutes per session: Not on file  . Stress: Not on file  Relationships  . Social connections:    Talks on phone: Not on file    Gets together: Not on file    Attends religious service: Not on file    Active member of club or organization: Not on file    Attends meetings of clubs or organizations: Not on file    Relationship status: Not on file  . Intimate partner violence:    Fear of current or ex partner: Not on file    Emotionally abused: Not on file    Physically abused: Not on file    Forced sexual activity: Not on file  Other Topics Concern  . Not on file  Social History Narrative   Close friend/significant other killed (GSW) 2012.   Retired after 30 years, and took another job (commutes to [place] 4 days/week).     PHYSICAL EXAM:  VS: BP  (!) 146/86 (BP Location: Left Arm, Patient Position: Sitting, Cuff Size: Normal)   Pulse 96   Ht 5' 4.2" (1.631 m)   SpO2 98%   BMI 33.43 kg/m  Physical Exam Gen: NAD, alert, cooperative with exam, well-appearing ENT: normal lips, normal nasal mucosa,  Eye: normal EOM, normal conjunctiva and lids CV:  no edema, +2 pedal pulses   Resp: no accessory muscle use, non-labored,   Skin: no rashes, no areas of induration  Neuro: normal tone, normal sensation to touch Psych:  normal insight, alert and oriented MSK: normal gait, normal strength       ASSESSMENT & PLAN:   Anxiety state Feels most of her symptoms are related to anxiety or PTSD at this point.  - zoloft.  - will provide note to write out of work for the next two weeks. Will follow up at that time and can increase zoloft.  - psych canceled and reschedule next week.

## 2018-07-07 NOTE — Patient Instructions (Addendum)
FOR LETTER EXERCISE:  - BLANK BACKGROUND - Feet staggered Right then Left foot forwards.  30 seconds side to side, up and down  - BUSY BACKGROUND - Feet together.  30 seconds each.    For both work towards 60 seconds, faster head movements.   Access Code: [email or web address]  URL: [email or web address]  Date: 07/07/2018  Prepared by: Misty Stanley   Exercises  Brock String - 5 reps - 1x daily - 5x weekly

## 2018-07-07 NOTE — Patient Instructions (Signed)
Good to see you  Please start the zoloft  Please see me back in 2 weeks.  Please try to relax and have some fun.

## 2018-07-11 ENCOUNTER — Telehealth: Payer: Self-pay | Admitting: *Deleted

## 2018-07-11 ENCOUNTER — Encounter: Payer: Self-pay | Admitting: Physical Therapy

## 2018-07-11 ENCOUNTER — Ambulatory Visit: Payer: BC Managed Care – PPO | Attending: Family Medicine | Admitting: Physical Therapy

## 2018-07-11 DIAGNOSIS — M542 Cervicalgia: Secondary | ICD-10-CM | POA: Diagnosis not present

## 2018-07-11 DIAGNOSIS — R42 Dizziness and giddiness: Secondary | ICD-10-CM | POA: Diagnosis present

## 2018-07-11 DIAGNOSIS — R262 Difficulty in walking, not elsewhere classified: Secondary | ICD-10-CM | POA: Diagnosis not present
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
## 2018-07-11 NOTE — Telephone Encounter (Signed)
Faxed work restriction note to [employer]

## 2018-07-11 NOTE — Telephone Encounter (Signed)
Faxed note to [phone]. Patient notified.

## 2018-07-11 NOTE — Telephone Encounter (Signed)
Patient calling to find out if work note was faxed by Dr. Clearance Coots? See note below.

## 2018-07-12 ENCOUNTER — Ambulatory Visit: Payer: BC Managed Care – PPO | Admitting: Physical Therapy

## 2018-07-12 ENCOUNTER — Encounter: Payer: Self-pay | Admitting: Physical Therapy

## 2018-07-12 DIAGNOSIS — R262 Difficulty in walking, not elsewhere classified: Secondary | ICD-10-CM

## 2018-07-12 DIAGNOSIS — M542 Cervicalgia: Secondary | ICD-10-CM | POA: Diagnosis not present

## 2018-07-12 DIAGNOSIS — R42 Dizziness and giddiness: Secondary | ICD-10-CM

## 2018-07-12 NOTE — Therapy (Signed)
[facility] [address] [place], [place], [address] Phone: [phone]   Fax:  [phone]  Physical Therapy Treatment  Patient Details  Name: Rebecca [name] MRN: [id number] Date of Birth: [birth year] Referring Provider: Rosemarie Ax, MD   Encounter Date: 07/12/2018  PT End of Session - 07/12/18 1340    Visit Number  22    Number of Visits  23    Date for PT Re-Evaluation  07/17/18    Authorization Type  [employer], [employer]    PT Start Time  1103    PT Stop Time  1147    PT Time Calculation (min)  44 min    Activity Tolerance  Patient tolerated treatment well    Behavior During Therapy  [facility] for tasks assessed/performed       Past Medical History:  Diagnosis Date  . Hyperlipidemia   . Obesity   . Peritonsillar abscess 02/03/2017  . Thyroid disease    monitoring a nodule 1/9    Past Surgical History:  Procedure Laterality Date  . ABDOMINAL HYSTERECTOMY      There were no vitals filed for this visit.  Subjective Assessment - 07/12/18 1110    Subjective  Pt had final DN appointment yesterday.  Physician took her out of work for 2 more weeks; pt isn't sure she will return to that job after the two weeks.  Still fatigued but had house guests all weekend.  Went out Saturday night for her birthday and did line dancing.    Pertinent History  anxiety, obesity, spinal stenosis in cervical region, thyroid nodules, and hyperlipidemia    Limitations  Walking;Standing    Patient Stated Goals  Return to walking (avid walker), relieve L neck pain, return to work    Currently in Pain?  Yes    Pain Onset  More than a month ago         [facility] PT Assessment - 07/12/18 1126      Standardized Balance Assessment   Standardized Balance Assessment  10 meter walk test    10 Meter Walk  8.8 seconds or 3.7 ft/sec      Functional Gait  Assessment   Gait assessed   Yes    Gait Level Surface  Walks 20 ft in less than 5.5 sec,  no assistive devices, good speed, no evidence for imbalance, normal gait pattern, deviates no more than 6 in outside of the 12 in walkway width.    Change in Gait Speed  Able to smoothly change walking speed without loss of balance or gait deviation. Deviate no more than 6 in outside of the 12 in walkway width.    Gait with Horizontal Head Turns  Performs head turns smoothly with no change in gait. Deviates no more than 6 in outside 12 in walkway width    Gait with Vertical Head Turns  Performs head turns with no change in gait. Deviates no more than 6 in outside 12 in walkway width.    Gait and Pivot Turn  Pivot turns safely within 3 sec and stops quickly with no loss of balance.    Step Over Obstacle  Is able to step over 2 stacked shoe boxes taped together (9 in total height) without changing gait speed. No evidence of imbalance.    Gait with Narrow Base of Support  Is able to ambulate for 10 steps heel to toe with no staggering.    Gait with Eyes Closed  Walks 20  ft, uses assistive device, slower speed, mild gait deviations, deviates 6-10 in outside 12 in walkway width. Ambulates 20 ft in less than 9 sec but greater than 7 sec.    Ambulating Backwards  Walks 20 ft, no assistive devices, good speed, no evidence for imbalance, normal gait    Steps  Alternating feet, no rail.    Total Score  29    FGA comment:  29/30 low risk for falls                    Vestibular Treatment/Exercise - 07/12/18 1344      Vestibular Treatment/Exercise   Vestibular Treatment Provided  Gaze    Gaze Exercises  X1 Viewing Horizontal;X1 Viewing Vertical      X1 Viewing Horizontal   Foot Position  feet staggered, R/L foot forwards.  Feet together for busy background    Comments  increased to 60 seconds      X1 Viewing Vertical   Foot Position  feet staggered, R/L foot forwards.  Feet together for busy background    Comments  increased to 60 seconds            PT Education - 07/12/18 1339     Education provided  Yes    Education Details  goals met; updated x1 viewing.  Plan for final visit    Person(s) Educated  Patient    Methods  Explanation    Comprehension  Verbalized understanding       PT Short Term Goals - 06/23/18 0942      PT SHORT TERM GOAL #1   Title  (All STG due by 06/23/2018)  Pt will demonstrate 5-8 degree increase in cervical spine ROM to improve safety with driving    Baseline  partially met; see flow sheets    Time  3    Period  Weeks    Status  Partially Met      PT SHORT TERM GOAL #2   Title  Pt will report decreased motion sensitivity on MSQ to 2-3/5 (mild-moderate)    Baseline  0 overall    Time  3    Period  Weeks    Status  Achieved      PT SHORT TERM GOAL #3   Title  Pt will demonstrate improved safety with gait as indicated by increase in gait velocity to >/= 3.6 ft/sec    Baseline  3.2 ft/sec - remained the same    Time  3    Period  Weeks    Status  Not Met        PT Long Term Goals - 07/12/18 1110      PT LONG TERM GOAL #1   Title  (ALL LTG DUE BY 07/17/18)  Pt will demonstrate independence with HEP: balance, vestibular, cervical spine and walking program    Time  6    Period  Weeks    Status  On-going    Target Date  07/17/18      PT LONG TERM GOAL #2   Title  Pt will improve FGA by 2-3 points to decrease falls risk    Baseline  29/30     Time  6    Period  Weeks    Status  Achieved      PT LONG TERM GOAL #3   Title  Pt will improve gait velocity to >/= 3.6 ft/sec    Baseline  3.7 ft/sec    Time  6  Period  Weeks    Status  Achieved      PT LONG TERM GOAL #4   Title  Pt will improve cervical AROM by 10 degrees (from STG measurements) and report 50% reduction in headache, neck and L shoulder pain    Baseline  improved by 15-20 degrees overall    Time  6    Period  Weeks    Status  Achieved      PT LONG TERM GOAL #5   Title  Pt will report 1-2 (lightheaded/mild) dizziness with movements on MSQ    Baseline  No  dizziness reported, only mild neck discomfort    Time  6    Period  Weeks    Status  Achieved            Plan - 07/12/18 1340    Clinical Impression Statement  Treatment session focused on assessment of progress towards LTG.  To date pt has met 4/5 goals; plan to complete final goal (#1) with review of final HEP at next visit.  x1 viewing updated this session.    PT Treatment/Interventions  ADLs/Self Care Home Management;Canalith Repostioning;Cryotherapy;Electrical Stimulation;Moist Heat;Traction;Gait training;Functional mobility training;Therapeutic activities;Therapeutic exercise;Balance training;Neuromuscular re-education;Patient/family education;Manual techniques;Passive range of motion;Dry needling;Vestibular    PT Next Visit Plan  Finalize HEP; x1 viewing already updated.  Continue to focus on C-spine stretches, stabilization.  Balance exercises.  Send to me for D/C.    PT Home Exercise Plan  [email or web address]    Consulted and Agree with Plan of Care  Patient       Patient will benefit from skilled therapeutic intervention in order to improve the following deficits and impairments:  Decreased activity tolerance, Decreased balance, Decreased range of motion, Decreased strength, Difficulty walking, Dizziness, Impaired perceived functional ability, Impaired sensation, Pain  Visit Diagnosis: Cervicalgia  Dizziness and giddiness  Difficulty in walking, not elsewhere classified     Problem List Patient Active Problem List   Diagnosis Date Noted  . Concussion with no loss of consciousness 03/17/2018  . Elevated blood pressure 07/16/2016  . Anxiety state 07/03/2016  . Neck pain 07/03/2016  . 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 Acid reflux disease 11/27/2014  . Special screening for malignant neoplasms, colon 09/17/2013  . Obesity, unspecified 01/16/2013  . Spinal stenosis in cervical region 01/11/2013  . Multiple thyroid nodules 01/11/2013  . Hand pain 06/16/2012  . Hyperlipidemia  06/16/2012    Rico Junker, PT, DPT 07/12/18    1:46 PM    [facility] [address] [place], [place], [address] Phone: [phone]   Fax:  [phone]  Name: Rebecca [name] MRN: [id number] Date of Birth: Mar 27, 1956

## 2018-07-12 NOTE — Patient Instructions (Addendum)
Access Code: [email or web address]  URL: [email or web address]  Date: 07/12/2018  Prepared by: Misty Stanley   Updated x1 viewing

## 2018-07-18 ENCOUNTER — Encounter: Payer: Self-pay | Admitting: Physical Therapy

## 2018-07-18 ENCOUNTER — Ambulatory Visit: Payer: BC Managed Care – PPO | Admitting: Physical Therapy

## 2018-07-18 DIAGNOSIS — R42 Dizziness and giddiness: Secondary | ICD-10-CM

## 2018-07-18 DIAGNOSIS — M542 Cervicalgia: Secondary | ICD-10-CM | POA: Diagnosis not present

## 2018-07-18 DIAGNOSIS — R262 Difficulty in walking, not elsewhere classified: Secondary | ICD-10-CM

## 2018-07-18 NOTE — Patient Instructions (Signed)
Access Code: [email or web address]  URL: [email or web address]  Date: 07/18/2018  Prepared by: Misty Stanley   Exercises  Standing Gaze Stabilization with Head Rotation - 1 reps - 2 sets - 60 seconds hold - 3x daily - 7x weekly  Prone Scapular Retraction - 10 reps - 3 sets - 1x daily - 7x weekly  Supine Cervical Retraction with Towel - 10 reps - 2 sets - 5 hold - 2x daily - 7x weekly  Seated Levator Scapulae Stretch - 3 reps - 30 hold - 2x daily - 7x weekly  Seated Cervical Sidebending Stretch - 3 reps - 30 hold - 2x daily - 7x weekly  Forward and Backward Walking with Half Turns - Slow - 4 reps - 2x daily - 7x weekly  Fransisco Beau String - 10 reps - 3 sets - 1x daily - 7x weekly  Standing Gaze Stabilization with Two Near Targets and Head Rotation - 10 reps - 2 sets - 1x daily - 7x weekly  Narrow Stance Sitting on Swiss Ball - 1 sets - 1x daily - 5x weekly  Seated Backward Shoulder Rolls - 10 reps - 2x daily - 7x weekly  Chest and Bicep Stretch - Arms Behind Back - 30 SECONDS hold - 2x daily - 7x weekly

## 2018-07-18 NOTE — Therapy (Addendum)
[facility] [address] [place], [place], [address] Phone: [phone]   Fax:  [phone]  Physical Therapy Treatment and D/C Summary  Patient Details  Name: Rebecca [name] MRN: [id number] Date of Birth: 09/30/1956 Referring Provider: Rosemarie Ax, MD   Encounter Date: 07/18/2018  PT End of Session - 07/18/18 1143    Visit Number  23    Number of Visits  23    Date for PT Re-Evaluation  07/17/18    Authorization Type  [employer], [facility]    PT Start Time  [phone]    PT Stop Time  [id number]    PT Time Calculation (min)  45 min    Activity Tolerance  Patient tolerated treatment well    Behavior During Therapy  [facility] for tasks assessed/performed       Past Medical History:  Diagnosis Date  . Hyperlipidemia   . Obesity   . Peritonsillar abscess 02/03/2017  . Thyroid disease    monitoring a nodule 1/9    Past Surgical History:  Procedure Laterality Date  . ABDOMINAL HYSTERECTOMY      There were no vitals filed for this visit.  Subjective Assessment - 07/18/18 0857    Subjective  Pt had a fall on Sunday; was in her bathroom, turned quickly and fell on the RUE.  Pt also notes that she looses her balance and runs into things on the right.  Still protecting her L side.    Pertinent History  anxiety, obesity, spinal stenosis in cervical region, thyroid nodules, and hyperlipidemia    Limitations  Walking;Standing    Patient Stated Goals  Return to walking (avid walker), relieve L neck pain, return to work    Currently in Pain?  Yes    Pain Score  3     Pain Location  Shoulder    Pain Orientation  Left    Pain Descriptors / Indicators  Sore    Pain Onset  More than a month ago         Access Code: [email or web address]  URL: [email or web address]  Date: 07/18/2018  Prepared by: Misty Stanley   Exercises  Standing Gaze Stabilization with Head Rotation - 1 reps - 2 sets - 60 seconds hold - 3x daily - 7x weekly   Prone Scapular Retraction - 10 reps - 3 sets - 1x daily - 7x weekly  Supine Cervical Retraction with Towel - 10 reps - 2 sets - 5 hold - 2x daily - 7x weekly  Seated Levator Scapulae Stretch - 3 reps - 30 hold - 2x daily - 7x weekly  Seated Cervical Sidebending Stretch - 3 reps - 30 hold - 2x daily - 7x weekly  Forward and Backward Walking with Half Turns - Slow - 4 reps - 2x daily - 7x weekly  Fransisco Beau String - 10 reps - 3 sets - 1x daily - 7x weekly  Standing Gaze Stabilization with Two Near Targets and Head Rotation - 10 reps - 2 sets - 1x daily - 7x weekly  Narrow Stance Sitting on Swiss Ball - 1 sets - 1x daily - 5x weekly  Seated Backward Shoulder Rolls - 10 reps - 2x daily - 7x weekly  Chest and Bicep Stretch - Arms Behind Back - 30 SECONDS hold - 2x daily - 7x weekly                 [facility] Adult PT Treatment/Exercise - 07/18/18 0001      Neck  Exercises: Stretches   Upper Trapezius Stretch  Right;Left;1 rep;30 seconds    Levator Stretch  Right;Left;1 rep;30 seconds    Chest Stretch  1 rep;30 seconds    Other Neck Stretches  Posterior shoulder rolls between stretches x 10 reps      Vestibular Treatment/Exercise - 07/18/18 1140      Vestibular Treatment/Exercise   Vestibular Treatment Provided  Gaze    Habituation Exercises  Seated Horizontal Head Turns;Seated Vertical Head Turns    Gaze Exercises  Eye/Head Exercise Horizontal;Eye/Head Exercise Vertical      Seated Horizontal Head Turns   Number of Reps   5    Symptom Description   seated on swiss ball performing bouncing      Seated Vertical Head Turns   Number of Reps   5    Symptom Description   seated on swissball performing bouncing; also performed bouncing x 30 seconds with eyes open and then with eyes closed      Eye/Head Exercise Horizontal   Foot Position  seated    Reps  10    Comments  targets 6 inches apart; max verbal cues to perform correct sequence      Eye/Head Exercise Vertical   Foot Position   seated    Reps  10    Comments  targets 6 inches apart; max verbal cues to perform correct sequence         Balance Exercises - 07/18/18 1138      Balance Exercises: Standing   Standing Eyes Closed  Narrow base of support (BOS);Foam/compliant surface;Head turns;Other reps (comment)   10 reps head nods/turns   Turning  Right;Left    Other Standing Exercises  For turning began in corner with marching in place performing 90 deg turns to L and R.  Transitioned to walking forwards and retro with 180 R/L turns with a stop and then progressed to walking forwards and retro with 180 R/L turns without stopping x 4 reps each direction with 1 LOB to R         PT Education - 07/18/18 1143    Education provided  Yes    Education Details  final HEP    Person(s) Educated  Patient    Methods  Explanation;Demonstration;Handout    Comprehension  Verbalized understanding;Returned demonstration       PT Short Term Goals - 06/23/18 0942      PT SHORT TERM GOAL #1   Title  (All STG due by 06/23/2018)  Pt will demonstrate 5-8 degree increase in cervical spine ROM to improve safety with driving    Baseline  partially met; see flow sheets    Time  3    Period  Weeks    Status  Partially Met      PT SHORT TERM GOAL #2   Title  Pt will report decreased motion sensitivity on MSQ to 2-3/5 (mild-moderate)    Baseline  0 overall    Time  3    Period  Weeks    Status  Achieved      PT SHORT TERM GOAL #3   Title  Pt will demonstrate improved safety with gait as indicated by increase in gait velocity to >/= 3.6 ft/sec    Baseline  3.2 ft/sec - remained the same    Time  3    Period  Weeks    Status  Not Met        PT Long Term Goals - 07/18/18 1143  PT LONG TERM GOAL #1   Title  (ALL LTG DUE BY 07/17/18)  Pt will demonstrate independence with HEP: balance, vestibular, cervical spine and walking program    Time  6    Period  Weeks    Status  Achieved      PT LONG TERM GOAL #2   Title  Pt  will improve FGA by 2-3 points to decrease falls risk    Baseline  29/30     Time  6    Period  Weeks    Status  Achieved      PT LONG TERM GOAL #3   Title  Pt will improve gait velocity to >/= 3.6 ft/sec    Baseline  3.7 ft/sec    Time  6    Period  Weeks    Status  Achieved      PT LONG TERM GOAL #4   Title  Pt will improve cervical AROM by 10 degrees (from STG measurements) and report 50% reduction in headache, neck and L shoulder pain    Baseline  improved by 15-20 degrees overall    Time  6    Period  Weeks    Status  Achieved      PT LONG TERM GOAL #5   Title  Pt will report 1-2 (lightheaded/mild) dizziness with movements on MSQ    Baseline  No dizziness reported, only mild neck discomfort    Time  6    Period  Weeks    Status  Achieved            Plan - 07/18/18 1145    Clinical Impression Statement  Treatment session focused on completing final LTG with update and review of final HEP focusing on oculomotor, vestibular, balance and cervical spine ROM/strengthening exercises.  Pt demonstrates good progress and has met 6/6 LTG with improvements in overall cervical spine ROM, decreased pain, decreased dizziness, improved balance and decreased falls risk.  Pt is safe for D/C from therapy at this time and will continue with HEP to maintain functional gains.    PT Treatment/Interventions  ADLs/Self Care Home Management;Canalith Repostioning;Cryotherapy;Electrical Stimulation;Moist Heat;Traction;Gait training;Functional mobility training;Therapeutic activities;Therapeutic exercise;Balance training;Neuromuscular re-education;Patient/family education;Manual techniques;Passive range of motion;Dry needling;Vestibular    PT Next Visit Plan  D/C    PT Home Exercise Plan  [email or web address]    Consulted and Agree with Plan of Care  Patient       Patient will benefit from skilled therapeutic intervention in order to improve the following deficits and impairments:  Decreased activity  tolerance, Decreased balance, Decreased range of motion, Decreased strength, Difficulty walking, Dizziness, Impaired perceived functional ability, Impaired sensation, Pain  Visit Diagnosis: Cervicalgia  Dizziness and giddiness  Difficulty in walking, not elsewhere classified     Problem List Patient Active Problem List   Diagnosis Date Noted  . Concussion with no loss of consciousness 03/17/2018  . Elevated blood pressure 07/16/2016  . Anxiety state 07/03/2016  . Neck pain 07/03/2016  . 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 Acid reflux disease 11/27/2014  . Special screening for malignant neoplasms, colon 09/17/2013  . Obesity, unspecified 01/16/2013  . Spinal stenosis in cervical region 01/11/2013  . Multiple thyroid nodules 01/11/2013  . Hand pain 06/16/2012  . Hyperlipidemia 06/16/2012    PHYSICAL THERAPY DISCHARGE SUMMARY  Visits from Start of Care: 23  Current functional level related to goals / functional outcomes: See LTG achievement and impression statement above   Remaining deficits: Impaired balance, oculomotor impairments, neck  and shoulder pain/increased tension   Education / Equipment: HEP  Plan: Patient agrees to discharge.  Patient goals were met. Patient is being discharged due to meeting the stated rehab goals.  ?????     Rico Junker, PT, DPT 07/18/18    12:17 PM    [facility] [address] [place], [place], [address] Phone: [phone]   Fax:  [phone]  Name: Rebecca [name] MRN: [id number] Date of Birth: [birth year]

## 2018-07-20 NOTE — Progress Notes (Signed)
Rebecca [name] - 62 y.o. female MRN [id number]  Date of birth: Jan 16, 1956  SUBJECTIVE:  Including CC & ROS.  Chief Complaint  Patient presents with  . Follow-up    Rebecca [name] is a 62 y.o. female that is here today for concussion follow up. She is doing well, has been sleeping better. Her last PT session was on 07/18/18. She did fall on 07/16/18 in the bathroom. Denies dizziness. Admits to intermittent headaches.  She states she turned too fast and resulted in a fall. Denies hitting her head. Denies light sensitivity.  She feels less stress with having the 2 weeks off.  She will have training coming up for her work that will cause her to travel.   Review of Systems  Constitutional: Negative for fever.  HENT: Negative for congestion.   Respiratory: Negative for cough.   Cardiovascular: Negative for chest pain.  Gastrointestinal: Negative for abdominal pain.  Musculoskeletal: Negative for back pain.  Skin: Negative for color change.  Neurological: Positive for headaches.  Hematological: Negative for adenopathy.  Psychiatric/Behavioral: Positive for decreased concentration.    HISTORY: Past Medical, Surgical, Social, and Family History Reviewed & Updated per EMR.   Pertinent Historical Findings include:  Past Medical History:  Diagnosis Date  . Hyperlipidemia   . Obesity   . Peritonsillar abscess 02/03/2017  . Thyroid disease    monitoring a nodule 1/9    Past Surgical History:  Procedure Laterality Date  . ABDOMINAL HYSTERECTOMY      Allergies  Allergen Reactions  . Prednisolone Other (See Comments)    Patient can't remember  Other reaction(s): Delusions (intolerance) Patient can't remember     Family History  Problem Relation Age of Onset  . Cancer Mother        pancreatic cancer  . Diabetes Mother   . Heart disease Mother        rheumatic heart disease  . Hyperlipidemia Mother   . COPD Sister   . Sleep apnea Sister   . Cancer Sister        Lung Cancer    . Cancer Brother        lung cancer  . Cancer Father        renal, bone  . Diabetes Father   . Hyperlipidemia Father   . Heart attack Maternal Grandfather   . Kidney disease Brother   . Colon cancer Maternal Aunt      Social History   Socioeconomic History  . Marital status: Single    Spouse name: N/A  . Number of children: 0  . Years of education: 18.5  . Highest education level: Not on file  Occupational History  . Occupation: Retired    Fish farm manager: Psychologist, sport and exercise    Comment: [employer] x 30 years  . 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 Financial resource strain: Not on file  . Food insecurity:    Worry: Not on file    Inability: Not on file  . Transportation needs:    Medical: Not on file    Non-medical: Not on file  Tobacco Use  . Smoking status: Never Smoker  . Smokeless tobacco: Never Used  Substance and Sexual Activity  . Alcohol use: Yes    Alcohol/week: 5.0 - 7.0 standard drinks    Types: 5 - 7 Standard drinks or equivalent per week    Comment: 4-5 * week/ 1-2 drinks  . Drug use: No  .  Sexual activity: Yes    Partners: Male    Birth control/protection: Surgical  Lifestyle  . Physical activity:    Days per week: Not on file    Minutes per session: Not on file  . Stress: Not on file  Relationships  . Social connections:    Talks on phone: Not on file    Gets together: Not on file    Attends religious service: Not on file    Active member of club or organization: Not on file    Attends meetings of clubs or organizations: Not on file    Relationship status: Not on file  . Intimate partner violence:    Fear of current or ex partner: Not on file    Emotionally abused: Not on file    Physically abused: Not on file    Forced sexual activity: Not on file  Other Topics Concern  . Not on file  Social History Narrative   Close friend/significant other killed (GSW) 2012.   Retired  after 30 years, and took another job (commutes to [place] 4 days/week).     PHYSICAL EXAM:  VS: BP (!) 145/88   Pulse 88   Ht 5' 4.2" (1.631 m)   Wt 194 lb (88 kg)   SpO2 97%   BMI 33.09 kg/m  Physical Exam Gen: NAD, alert, cooperative with exam, well-appearing ENT: normal lips, normal nasal mucosa,  Eye: normal EOM, normal conjunctiva and lids CV:  no edema, +2 pedal pulses   Resp: no accessory muscle use, non-labored,  Skin: no rashes, no areas of induration  Neuro: normal tone, normal sensation to touch Psych:  normal insight, alert and oriented MSK: Normal gait, normal strength     ASSESSMENT & PLAN:   Anxiety state Having improvement with the 2 weeks off from work.  She was able to sleep and appears less stressed.  Does feel groggy sometimes in the morning if she has to wake up early. -We will continue her current dose -Has finished physical therapy and will hold off continuing for now. -Provided a work note for 4-day work weeks -She will follow-up in 3 weeks.Marland Kitchen

## 2018-07-21 ENCOUNTER — Ambulatory Visit (INDEPENDENT_AMBULATORY_CARE_PROVIDER_SITE_OTHER): Payer: BC Managed Care – PPO | Admitting: Family Medicine

## 2018-07-21 ENCOUNTER — Encounter: Payer: Self-pay | Admitting: Family Medicine

## 2018-07-21 DIAGNOSIS — F411 Generalized anxiety disorder: Secondary | ICD-10-CM

## 2018-07-21 DIAGNOSIS — R51 Headache: Secondary | ICD-10-CM | POA: Diagnosis not present

## 2018-07-21 DIAGNOSIS — R7303 Prediabetes: Secondary | ICD-10-CM | POA: Diagnosis not present

## 2018-07-21 DIAGNOSIS — E669 Obesity, unspecified: Secondary | ICD-10-CM | POA: Diagnosis not present

## 2018-07-21 NOTE — Patient Instructions (Signed)
Good to see you  Please continue the same dose of the medicine  We'll fax the letter  [facility] see you again in 3 weeks.

## 2018-07-22 ENCOUNTER — Ambulatory Visit (HOSPITAL_COMMUNITY)
Admission: EM | Admit: 2018-07-22 | Discharge: 2018-07-22 | Disposition: A | Discharge status: Home / Self Care | Payer: BC Managed Care – PPO | Attending: Physician Assistant | Admitting: Physician Assistant

## 2018-07-22 ENCOUNTER — Encounter (HOSPITAL_COMMUNITY): Payer: Self-pay | Admitting: Emergency Medicine

## 2018-07-22 DIAGNOSIS — L03012 Cellulitis of left finger: Secondary | ICD-10-CM

## 2018-07-22 MED ORDER — CEPHALEXIN 500 MG PO CAPS: 500.0000 mg | ORAL_CAPSULE | Freq: Three times a day (TID) | ORAL | 0 refills | Status: AC

## 2018-07-22 NOTE — Discharge Instructions (Signed)
Continue warm epson salt soaks.

## 2018-07-22 NOTE — ED Provider Notes (Signed)
07/22/2018 2:16 PM   DOB: June 15, 1956 / MRN: [id number]  SUBJECTIVE:  Rebecca [name] is a 62 y.o. female presenting for nail fold pain about the left ring finger that started a few days ago.  Assoicates TTP and erythema with pus.  Denies systemic symptoms.  Has tried epsom salts.   She is allergic to prednisolone.   She  has a past medical history of Hyperlipidemia, Obesity, Peritonsillar abscess (02/03/2017), and Thyroid disease.    She  reports that she has never smoked. She has never used smokeless tobacco. She reports that she drinks about 5.0 - 7.0 standard drinks of alcohol per week. She reports that she does not use drugs. She  reports that she currently engages in sexual activity and has had partner(s) who are Female. She reports using the following method of birth control/protection: Surgical. The patient  has a past surgical history that includes Abdominal hysterectomy.  Her family history includes COPD in her sister; Cancer in her brother, father, mother, and sister; Colon cancer in her maternal aunt; Diabetes in her father and mother; Heart attack in her maternal grandfather; Heart disease in her mother; Hyperlipidemia in her father and mother; Kidney disease in her brother; Sleep apnea in her sister.  Review of Systems  Skin: Positive for rash.    OBJECTIVE:  BP (!) 166/70 (BP Location: Left Arm) Comment: pt would not keep still when taking BP  Pulse 71   Temp 98.3 F (36.8 C) (Oral)   Resp 16   SpO2 100%   Wt Readings from Last 3 Encounters:  07/21/18 194 lb (88 kg)  06/15/18 196 lb (88.9 kg)  05/19/18 193 lb (87.5 kg)   Temp Readings from Last 3 Encounters:  07/22/18 98.3 F (36.8 C) (Oral)  05/19/18 97.9 F (36.6 C) (Oral)  04/20/18 98.4 F (36.9 C) (Oral)   BP Readings from Last 3 Encounters:  07/22/18 (!) 166/70  07/21/18 (!) 145/88  07/07/18 (!) 146/86   Pulse Readings from Last 3 Encounters:  07/22/18 71  07/21/18 88  07/07/18 96    Physical Exam    Constitutional: She is oriented to person, place, and time. She appears well-nourished. No distress.  Eyes: Pupils are equal, round, and reactive to light. EOM are normal.  Cardiovascular: Normal rate.  Pulmonary/Chest: Effort normal.  Abdominal: She exhibits no distension.  Musculoskeletal:       Hands: Neurological: She is alert and oriented to person, place, and time. No cranial nerve deficit. Gait normal.  Skin: Skin is dry. She is not diaphoretic.  Psychiatric: She has a normal mood and affect.  Vitals reviewed.   No results found for this or any previous visit (from the past 72 hour(s)).  No results found.  ASSESSMENT AND PLAN:   Paronychia of finger of left hand  Discharge Instructions   None        The patient is advised to call or return to clinic if she does not see an improvement in symptoms, or to seek the care of the closest emergency department if she worsens with the above plan.   Philis Fendt, MHS, PA-C 07/22/2018 2:16 PM   Tereasa Coop, PA-C 07/22/18 1417

## 2018-07-22 NOTE — ED Triage Notes (Signed)
Pt states she gets dizzy often due to a concussion, and loses her balance a lot, pt states she lost her balance and fell and hit the lfoor, landed on her R elbow. Pt has healing laceration to R forearm. Pt also c/o swelling and redness around the nail bed of her L ring finger.

## 2018-07-23 NOTE — Assessment & Plan Note (Signed)
Having improvement with the 2 weeks off from work.  She was able to sleep and appears less stressed.  Does feel groggy sometimes in the morning if she has to wake up early. -We will continue her current dose -Has finished physical therapy and will hold off continuing for now. -Provided a work note for 4-day work weeks -She will follow-up in 3 weeks.Marland Kitchen

## 2018-07-24 ENCOUNTER — Telehealth: Payer: Self-pay | Admitting: *Deleted

## 2018-07-24 NOTE — Telephone Encounter (Signed)
Note was faxed on 07/21/18     Copied from [place] #[id number]. Topic: Quick Communication - See Telephone Encounter >> Jul 21, 2018 11:39 AM Antonieta Iba C wrote: CRM for notification. See Telephone encounter for: 07/21/18.  Pt called in requesting a call back from Dr. Raeford Razor assistant. Pt says that provider excused her from work until today. Pt would like to know if providers assistant could fax over work note to her job. Pt unable to provide fax #, pt says that the office has fax #   CB: [phone]  -
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
## 2018-08-11 ENCOUNTER — Ambulatory Visit (INDEPENDENT_AMBULATORY_CARE_PROVIDER_SITE_OTHER): Payer: BC Managed Care – PPO | Admitting: Family Medicine

## 2018-08-11 ENCOUNTER — Encounter: Payer: Self-pay | Admitting: Family Medicine

## 2018-08-11 DIAGNOSIS — F411 Generalized anxiety disorder: Secondary | ICD-10-CM | POA: Diagnosis not present

## 2018-08-11 DIAGNOSIS — E669 Obesity, unspecified: Secondary | ICD-10-CM | POA: Diagnosis not present

## 2018-08-11 DIAGNOSIS — E782 Mixed hyperlipidemia: Secondary | ICD-10-CM | POA: Diagnosis not present

## 2018-08-11 DIAGNOSIS — E559 Vitamin D deficiency, unspecified: Secondary | ICD-10-CM | POA: Diagnosis not present

## 2018-08-11 NOTE — Patient Instructions (Signed)
Good to see you  I will get the letter faxed in and see you back in a week.

## 2018-08-13 NOTE — Assessment & Plan Note (Signed)
Her job has been a significant stress on her as of late.  Is only able to take the medication on the days she does not work.  She feels much better when she is allowed to sleep through a full night.  Still having symptoms when she has to drive and this makes her leave significantly earlier than she normally would for work. -Work note will be provided. -Continue current dose of Zoloft.  May need to try to change as this seems to make her drowsy.  However she has to wake up at 3 AM in order to make it to work and avoid traffic. -She will follow-up in 1 month.

## 2018-08-15 ENCOUNTER — Telehealth: Payer: Self-pay | Admitting: Family Medicine

## 2018-08-15 NOTE — Telephone Encounter (Signed)
Copied from [place] [id number]. Topic: General - Other >> Aug 15, 2018  2:11 PM Carolyn Stare wrote:  Pt would like a call back about paperwork she left at the office

## 2018-08-15 NOTE — Telephone Encounter (Signed)
Do you have any forms patient may have left with you?

## 2018-08-16 NOTE — Telephone Encounter (Signed)
Patient calling and states that she would like a phone call before these forms are to be filled out. States that she can come in and make an appointment with Dr Raeford Razor if needed, but would like a phone call to discuss forms before they are completed and faxed. Please advise

## 2018-08-16 NOTE — Telephone Encounter (Signed)
Spoke with patient, Will discuss at appointment 08/17/18

## 2018-08-17 ENCOUNTER — Ambulatory Visit (INDEPENDENT_AMBULATORY_CARE_PROVIDER_SITE_OTHER): Payer: BC Managed Care – PPO | Admitting: Family Medicine

## 2018-08-17 ENCOUNTER — Encounter: Payer: Self-pay | Admitting: Family Medicine

## 2018-08-17 DIAGNOSIS — F411 Generalized anxiety disorder: Secondary | ICD-10-CM | POA: Diagnosis not present

## 2018-08-17 NOTE — Progress Notes (Signed)
Rebecca [name] - 62 y.o. female MRN [id number]  Date of birth: 10/02/1956  SUBJECTIVE:  Including CC & ROS.  Chief Complaint  Patient presents with  . Follow-up    Rebecca [name] is a 62 y.o. female that is here today for anxiety follow up. She was been working limited hours due to driving for long periods. Admits to intermittent headaches and dizziness. Denies any recent falls.  She has been having neck and shoulder pain. Taking flexeril with improvement.  She has been taking the Zoloft 4 days a week.  It can cause her drowsiness so she does not take it if she has to work.  She does notice an improvement when she does take the medication.  Has not taken any anti-inflammatories.    Review of Systems  Constitutional: Negative for fever.  HENT: Negative for congestion.   Respiratory: Negative for cough.   Cardiovascular: Negative for chest pain.  Gastrointestinal: Negative for abdominal pain.  Musculoskeletal: Positive for neck pain.  Skin: Negative for color change.  Neurological: Positive for dizziness and headaches.  Hematological: Negative for adenopathy.  Psychiatric/Behavioral: The patient is nervous/anxious.     HISTORY: Past Medical, Surgical, Social, and Family History Reviewed & Updated per EMR.   Pertinent Historical Findings include:  Past Medical History:  Diagnosis Date  . Hyperlipidemia   . Obesity   . Peritonsillar abscess 02/03/2017  . Thyroid disease    monitoring a nodule 1/9    Past Surgical History:  Procedure Laterality Date  . ABDOMINAL HYSTERECTOMY      Allergies  Allergen Reactions  . Prednisolone Other (See Comments)    Patient can't remember  Other reaction(s): Delusions (intolerance) Patient can't remember     Family History  Problem Relation Age of Onset  . Cancer Mother        pancreatic cancer  . Diabetes Mother   . Heart disease Mother        rheumatic heart disease  . Hyperlipidemia Mother   . COPD Sister   . Sleep apnea  Sister   . Cancer Sister        Lung Cancer  . Cancer Brother        lung cancer  . Cancer Father        renal, bone  . Diabetes Father   . Hyperlipidemia Father   . Heart attack Maternal Grandfather   . Kidney disease Brother   . Colon cancer Maternal Aunt      Social History   Socioeconomic History  . Marital status: Single    Spouse name: N/A  . Number of children: 0  . Years of education: 18.5  . Highest education level: Not on file  Occupational History  . Occupation: Retired    Fish farm manager: Psychologist, sport and exercise    Comment: [employer] x 30 years  . 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 Financial resource strain: Not on file  . Food insecurity:    Worry: Not on file    Inability: Not on file  . Transportation needs:    Medical: Not on file    Non-medical: Not on file  Tobacco Use  . Smoking status: Never Smoker  . Smokeless tobacco: Never Used  Substance and Sexual Activity  . Alcohol use: Yes    Alcohol/week: 5.0 - 7.0 standard drinks    Types: 5 - 7 Standard drinks or equivalent per week    Comment: 4-5 *  week/ 1-2 drinks  . Drug use: No  . Sexual activity: Yes    Partners: Male    Birth control/protection: Surgical  Lifestyle  . Physical activity:    Days per week: Not on file    Minutes per session: Not on file  . Stress: Not on file  Relationships  . Social connections:    Talks on phone: Not on file    Gets together: Not on file    Attends religious service: Not on file    Active member of club or organization: Not on file    Attends meetings of clubs or organizations: Not on file    Relationship status: Not on file  . Intimate partner violence:    Fear of current or ex partner: Not on file    Emotionally abused: Not on file    Physically abused: Not on file    Forced sexual activity: Not on file  Other Topics Concern  . Not on file  Social History Narrative   Close  friend/significant other killed (GSW) 2012.   Retired after 30 years, and took another job (commutes to [employer] 4 days/week).     PHYSICAL EXAM:  VS: BP 136/74   Pulse 86   Ht 5\' 4"  (1.626 m)   SpO2 99%   BMI 33.71 kg/m  Physical Exam Gen: NAD, alert, cooperative with exam, well-appearing ENT: normal lips, normal nasal mucosa,  [name]: normal EOM, normal conjunctiva and lids CV:  no edema, +2 pedal pulses   Resp: no accessory muscle use, non-labored,  Skin: no rashes, no areas of induration  Neuro: normal tone, normal sensation to touch Psych:  normal insight, alert and oriented MSK: Normal gait, normal strength     ASSESSMENT & PLAN:   I spent 25 minutes with this patient, greater than 50% was face-to-face time counseling regarding the below diagnosis.   Anxiety state Ongoing symptoms.  These seem to be exacerbated when she has to drive as well as her work environment.  She has lack of sleep if she has to get up early in the morning this is because her stress. -Counseled about her ongoing symptoms and how they seem to be exacerbated with the stress with work in the drive. -I will fill out her paperwork and fax -She has an appointment with behavioral health this weekend and she can follow-up with me if needed after that appointment.

## 2018-08-17 NOTE — Telephone Encounter (Signed)
Patient would like a call back from the nurse/cma to discuss ongoing treatment and paperwork. She needs to clarify some things discussed at today's visit.

## 2018-08-17 NOTE — Assessment & Plan Note (Signed)
Ongoing symptoms.  These seem to be exacerbated when she has to drive as well as her work environment.  She has lack of sleep if she has to get up early in the morning this is because her stress. -Counseled about her ongoing symptoms and how they seem to be exacerbated with the stress with work in the drive. -I will fill out her paperwork and fax -She has an appointment with behavioral health this weekend and she can follow-up with me if needed after that appointment.

## 2018-08-19 ENCOUNTER — Ambulatory Visit (INDEPENDENT_AMBULATORY_CARE_PROVIDER_SITE_OTHER): Payer: BC Managed Care – PPO | Admitting: Psychiatry

## 2018-08-19 ENCOUNTER — Encounter (HOSPITAL_COMMUNITY): Payer: Self-pay | Admitting: Psychiatry

## 2018-08-19 VITALS — BP 130/76 | HR 80 | Ht 63.0 in | Wt 196.0 lb

## 2018-08-19 DIAGNOSIS — F411 Generalized anxiety disorder: Secondary | ICD-10-CM | POA: Diagnosis not present

## 2018-08-19 DIAGNOSIS — Z566 Other physical and mental strain related to work: Secondary | ICD-10-CM | POA: Diagnosis not present

## 2018-08-19 DIAGNOSIS — F4310 Post-traumatic stress disorder, unspecified: Secondary | ICD-10-CM | POA: Diagnosis not present

## 2018-08-19 MED ORDER — BUSPIRONE HCL 7.5 MG PO TABS: 7.5000 mg | ORAL_TABLET | Freq: Every day | ORAL | 0 refills | Status: AC
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
## 2018-08-19 NOTE — Patient Instructions (Signed)
Take zoloft regularly  In the evening. After one week can increase to 75mg  (one and one half tablet ) a day Refer to therapy

## 2018-08-21 ENCOUNTER — Encounter: Payer: Self-pay | Admitting: Family Medicine

## 2018-08-21 ENCOUNTER — Ambulatory Visit (INDEPENDENT_AMBULATORY_CARE_PROVIDER_SITE_OTHER): Payer: BC Managed Care – PPO | Admitting: Family Medicine

## 2018-08-21 ENCOUNTER — Telehealth: Payer: Self-pay | Admitting: Family Medicine

## 2018-08-21 VITALS — BP 140/66 | HR 74 | Temp 97.7°F | Ht 63.0 in | Wt 195.0 lb

## 2018-08-21 DIAGNOSIS — G4700 Insomnia, unspecified: Secondary | ICD-10-CM

## 2018-08-21 DIAGNOSIS — M542 Cervicalgia: Secondary | ICD-10-CM

## 2018-08-21 DIAGNOSIS — R8290 Unspecified abnormal findings in urine: Secondary | ICD-10-CM

## 2018-08-21 DIAGNOSIS — F4310 Post-traumatic stress disorder, unspecified: Secondary | ICD-10-CM

## 2018-08-21 DIAGNOSIS — I10 Essential (primary) hypertension: Secondary | ICD-10-CM | POA: Diagnosis not present

## 2018-08-21 DIAGNOSIS — F32A Depression, unspecified: Secondary | ICD-10-CM

## 2018-08-21 DIAGNOSIS — Z09 Encounter for follow-up examination after completed treatment for conditions other than malignant neoplasm: Secondary | ICD-10-CM

## 2018-08-21 DIAGNOSIS — F419 Anxiety disorder, unspecified: Secondary | ICD-10-CM | POA: Diagnosis not present

## 2018-08-21 DIAGNOSIS — F329 Major depressive disorder, single episode, unspecified: Secondary | ICD-10-CM

## 2018-08-21 LAB — POCT URINALYSIS DIP (MANUAL ENTRY)
Bilirubin, UA: NEGATIVE
Glucose, UA: NEGATIVE mg/dL
Ketones, POC UA: NEGATIVE mg/dL
Leukocytes, UA: NEGATIVE
Nitrite, UA: NEGATIVE
Protein Ur, POC: >=300 mg/dL — AB
Spec Grav, UA: >=1.03 — AB (ref 1.010–1.025)
Urobilinogen, UA: 0.2 E.U./dL
pH, UA: 5.5 (ref 5.0–8.0)

## 2018-08-21 MED ORDER — CYCLOBENZAPRINE HCL 10 MG PO TABS: 10.0000 mg | ORAL_TABLET | Freq: Three times a day (TID) | ORAL | 2 refills | Status: DC | PRN

## 2018-08-21 NOTE — Telephone Encounter (Signed)
Spoke with patient about her questions.   Rosemarie Ax, MD [facility] 08/21/2018, 9:07 AM

## 2018-08-21 NOTE — Progress Notes (Signed)
Follow Up  Subjective:    Patient ID: Rebecca [name], female    DOB: [birth year], 62 y.o.   MRN: [id number]  Chief Complaint  Patient presents with  . Follow-up    Chronic condition  . Neck Pain  . Anxiety   HPI  [name] is a 62 year old female with a past medical history of Thyroid, Peritonsillar Abscess, Obesity, and Hyperlipidemia. She is here today for follow up.   Current Status: Since her last office visit, she is doing well. She has completed her weekly Physical Therapy treatments in September. She continues to use techniques learned in PT. She continues to have incidents of stumbling. She has not had any visual changes, dizziness, and falls.  Her anxiety is worsening because of her PTSD. She continues to see Dr. Raeford Razor in Psychiatry, who has recently prescribed her Zoloft, who she last saw on 08/18/2018. Her next appointment is 08/25/2018. She was recently started on Buspar by her counselor at [facility]. She denies suicidal ideations, homicidal ideations, or auditory hallucinations.   She is currently working on getting short term disability, because of stress of drive to work daily, increased travel and demands of job. She continues to have mild neck and shoulder pain, which she take Flexeril when she is not working because of drowsy side effects.   She denies fevers, chills, fatigue, recent infections, weight loss, and night sweats.  No chest pain, heart palpitations, cough and shortness of breath reported. No reports of GI problems such as nausea, vomiting, diarrhea, and constipation. She has no reports of blood in stools, dysuria and hematuria.   Past Medical History:  Diagnosis Date  . Hyperlipidemia   . Obesity   . Peritonsillar abscess 02/03/2017  . Thyroid disease    monitoring a nodule 1/9    Family History  Problem Relation Age of Onset  . Cancer Mother        pancreatic cancer  . Diabetes Mother   . Heart disease Mother        rheumatic heart  disease  . Hyperlipidemia Mother   . COPD Sister   . Sleep apnea Sister   . Cancer Sister        Lung Cancer  . Cancer Brother        lung cancer  . Cancer Father        renal, bone  . Diabetes Father   . Hyperlipidemia Father   . Heart attack Maternal Grandfather   . Kidney disease Brother   . Colon cancer Maternal Aunt     Social History   Socioeconomic History  . Marital status: Single    Spouse name: N/A  . Number of children: 0  . Years of education: 18.5  . Highest education level: Not on file  Occupational History  . Occupation: Retired    Fish farm manager: Psychologist, sport and exercise    Comment: [employer] x 30 years  . 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 Financial resource strain: Not hard at all  . Food insecurity:    Worry: Never true    Inability: Never true  . Transportation needs:    Medical: No    Non-medical: No  Tobacco Use  . Smoking status: Never Smoker  . Smokeless tobacco: Never Used  Substance and Sexual Activity  . Alcohol use: Yes    Alcohol/week: 5.0 - 7.0 standard drinks    Types: 5 - 7 Standard drinks  or equivalent per week    Comment: 4-5 * week/ 1-2 drinks  . Drug use: No  . Sexual activity: Yes    Partners: Male    Birth control/protection: Surgical  Lifestyle  . Physical activity:    Days per week: 0 days    Minutes per session: 0 min  . Stress: Very much  Relationships  . Social connections:    Talks on phone: More than three times a week    Gets together: More than three times a week    Attends religious service: More than 4 times per year    Active member of club or organization: Yes    Attends meetings of clubs or organizations: More than 4 times per year    Relationship status: Not on file  . Intimate partner violence:    Fear of current or ex partner: No    Emotionally abused: No    Physically abused: No    Forced sexual activity: No  Other Topics Concern  . Not  on file  Social History Narrative   Close friend/significant other killed (GSW) 2012.   Retired after 30 years, and took another job (commutes to [place] 4 days/week).    Past Surgical History:  Procedure Laterality Date  . ABDOMINAL HYSTERECTOMY      Immunization History  Administered Date(s) Administered  . Influenza Split 08/10/2012  . Td 04/27/2013    Current Meds  Medication Sig  . cyclobenzaprine (FLEXERIL) 10 MG tablet Take 1 tablet (10 mg total) by mouth 3 (three) times daily as needed for muscle spasms.  Marland Kitchen pyridOXINE (VITAMIN B-6) 100 MG tablet Take 100 mg by mouth daily.  . sertraline (ZOLOFT) 50 MG tablet Take 1 tablet (50 mg total) by mouth daily.  . [DISCONTINUED] cyclobenzaprine (FLEXERIL) 10 MG tablet Take 10 mg by mouth 3 (three) times daily as needed for muscle spasms.    Allergies  Allergen Reactions  . Prednisolone Other (See Comments)    Patient can't remember  Other reaction(s): Delusions (intolerance) Patient can't remember     BP 140/66 (BP Location: Left Arm, Patient Position: Sitting, Cuff Size: Large)   Pulse 74   Temp 97.7 F (36.5 C) (Oral)   Ht 5\' 3"  (1.6 m)   Wt 195 lb (88.5 kg)   SpO2 99%   BMI 34.54 kg/m   Review of Systems  Constitutional: Negative.   HENT: Negative.   Eyes: Negative.   Respiratory: Negative.   Cardiovascular: Negative.   Gastrointestinal: Negative.   Musculoskeletal: Positive for neck pain.       Shoulder pain  Skin: Negative.   Neurological: Positive for headaches (Occasional).  Psychiatric/Behavioral:       PTSD   Objective:   Physical Exam  Constitutional: She appears well-developed and well-nourished.  HENT:  Head: Normocephalic and atraumatic.  Eyes: Pupils are equal, round, and reactive to light. EOM are normal.  Neck:  Limited ROM in neck. Left neck area more painful.   Cardiovascular: Normal rate, regular rhythm, normal heart sounds and intact distal pulses.  Pulmonary/Chest: Effort normal and  breath sounds normal.  Abdominal: Soft. Bowel sounds are normal.  Skin: Skin is warm and dry.  Psychiatric: She has a normal mood and affect. Her behavior is normal. Judgment and thought content normal.  Increased anxiety.   Nursing note and vitals reviewed.  Assessment & Plan:   1. Essential hypertension Blood pressure is stable at 140/66 today. We will continue to monitor. She will continue to  decrease high sodium intake, excessive alcohol intake, increase potassium intake, smoking cessation, and increase physical activity of at least 30 minutes of cardio activity daily. She will continue to follow Heart Healthy or DASH diet. - POCT urinalysis dipstick  2. PTSD (post-traumatic stress disorder) Mild. Not worsening. Continue to monitor.   3. Insomnia, unspecified type Stable. Continue Trazodone as needed.   4. Anxiety and depression Stable today. She will continue follow up with Dr. Raeford Razor and counselor at [facility] as scheduled. Begin Buspar today.   5. Neck pain Refill: - cyclobenzaprine (FLEXERIL) 10 MG tablet; Take 1 tablet (10 mg total) by mouth 3 (three) times daily as needed for muscle spasms.  Dispense: 30 tablet; Refill: 2  6. Follow up She will follow up in 3 months.    Meds ordered this encounter  Medications  . cyclobenzaprine (FLEXERIL) 10 MG tablet    Sig: Take 1 tablet (10 mg total) by mouth 3 (three) times daily as needed for muscle spasms.    Dispense:  30 tablet    Refill:  [place],  MSN, FNP-C [facility] [address] [place], [place] [address] [phone]

## 2018-08-21 NOTE — Telephone Encounter (Signed)
Work note completed.   Rosemarie Ax, MD [facility] 08/21/2018, 2:01 PM

## 2018-08-23 LAB — URINE CULTURE

## 2018-08-25 ENCOUNTER — Ambulatory Visit: Payer: BC Managed Care – PPO | Admitting: Family Medicine

## 2018-08-29 ENCOUNTER — Telehealth: Payer: Self-pay | Admitting: Family Medicine

## 2018-08-29 NOTE — Telephone Encounter (Signed)
Copied from [place] [id number]. Topic: General - Other >> Aug 29, 2018  3:59 PM Bea Graff, NT wrote: Reason for CRM: Pt is requesting a call from Dr. Doristine Locks nurse to discuss her forms for work. Pt would not provide any more info. Please advise.

## 2018-08-31 NOTE — Telephone Encounter (Signed)
Attempted to leave message on voicemail, could not leave message.

## 2018-09-04 DIAGNOSIS — R7303 Prediabetes: Secondary | ICD-10-CM | POA: Diagnosis not present

## 2018-09-04 DIAGNOSIS — E669 Obesity, unspecified: Secondary | ICD-10-CM | POA: Diagnosis not present

## 2018-09-04 DIAGNOSIS — E559 Vitamin D deficiency, unspecified: Secondary | ICD-10-CM | POA: Diagnosis not present

## 2018-09-04 NOTE — Telephone Encounter (Signed)
Spoke with patient regarding paperwork. Needs forms to be filled out for her wok for disability.

## 2018-09-06 ENCOUNTER — Encounter: Payer: Self-pay | Admitting: Family Medicine

## 2018-09-06 DIAGNOSIS — F4310 Post-traumatic stress disorder, unspecified: Secondary | ICD-10-CM | POA: Insufficient documentation

## 2018-09-06 NOTE — Telephone Encounter (Signed)
Spoke with patient about disability paperwork. Will complete it with her on her appointment on Friday.   Rosemarie Ax, MD [facility] 09/06/2018, 11:31 AM

## 2018-09-07 NOTE — Progress Notes (Signed)
Rebecca [name] - 62 y.o. female MRN [id number]  Date of birth: 1956-10-01  SUBJECTIVE:  Including CC & ROS.  Chief Complaint  Patient presents with  . Follow-up    anxiety   I Rebecca [name] am serving as a scribe for Dr. Clearance Coots.   Rebecca [name] is a 62 y.o. female that is here for anxiety. States that she is not doing any better from her last visit.  She was seen by psychiatry and her Zoloft was increased.  BuSpar was also added to her regimen.  She reports having ongoing symptoms.   Review of Systems  Constitutional: Negative for fever.  HENT: Negative for congestion.   Respiratory: Negative for cough.   Cardiovascular: Negative for chest pain.  Gastrointestinal: Negative for abdominal pain.  Neurological: Positive for headaches.  Psychiatric/Behavioral: The patient is nervous/anxious.     HISTORY: Past Medical, Surgical, Social, and Family History Reviewed & Updated per EMR.   Pertinent Historical Findings include:  Past Medical History:  Diagnosis Date  . Hyperlipidemia   . Obesity   . Peritonsillar abscess 02/03/2017  . Thyroid disease    monitoring a nodule 1/9    Past Surgical History:  Procedure Laterality Date  . ABDOMINAL HYSTERECTOMY      Allergies  Allergen Reactions  . Prednisolone Other (See Comments)    Patient can't remember  Other reaction(s): Delusions (intolerance) Patient can't remember     Family History  Problem Relation Age of Onset  . Cancer Mother        pancreatic cancer  . Diabetes Mother   . Heart disease Mother        rheumatic heart disease  . Hyperlipidemia Mother   . COPD Sister   . Sleep apnea Sister   . Cancer Sister        Lung Cancer  . Cancer Brother        lung cancer  . Cancer Father        renal, bone  . Diabetes Father   . Hyperlipidemia Father   . Heart attack Maternal Grandfather   . Kidney disease Brother   . Colon cancer Maternal Aunt      Social History   Socioeconomic History  .  Marital status: Single    Spouse name: N/A  . Number of children: 0  . Years of education: 18.5  . Highest education level: Not on file  Occupational History  . Occupation: Retired    Fish farm manager: Psychologist, sport and exercise    Comment: [employer] x 30 years  . 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 Financial resource strain: Not hard at all  . Food insecurity:    Worry: Never true    Inability: Never true  . Transportation needs:    Medical: No    Non-medical: No  Tobacco Use  . Smoking status: Never Smoker  . Smokeless tobacco: Never Used  Substance and Sexual Activity  . Alcohol use: Yes    Alcohol/week: 5.0 - 7.0 standard drinks    Types: 5 - 7 Standard drinks or equivalent per week    Comment: 4-5 * week/ 1-2 drinks  . Drug use: No  . Sexual activity: Yes    Partners: Male    Birth control/protection: Surgical  Lifestyle  . Physical activity:    Days per week: 0 days    Minutes per session: 0 min  . Stress: Very much  Relationships  .  Social connections:    Talks on phone: More than three times a week    Gets together: More than three times a week    Attends religious service: More than 4 times per year    Active member of club or organization: Yes    Attends meetings of clubs or organizations: More than 4 times per year    Relationship status: Not on file  . Intimate partner violence:    Fear of current or ex partner: No    Emotionally abused: No    Physically abused: No    Forced sexual activity: No  Other Topics Concern  . Not on file  Social History Narrative   Close friend/significant other killed (GSW) 2012.   Retired after 30 years, and took another job (commutes to [place] 4 days/week).     PHYSICAL EXAM:  VS: BP 130/80 (BP Location: Left Arm, Patient Position: Sitting)   Pulse 85   Ht 5\' 3"  (1.6 m)   Wt 201 lb (91.2 kg)   SpO2 97%   BMI 35.61 kg/m  Physical Exam Gen: NAD, alert,  cooperative with exam, well-appearing ENT: normal lips, normal nasal mucosa,  Eye: normal EOM, normal conjunctiva and lids CV:  no edema, +2 pedal pulses   Resp: no accessory muscle use, non-labored,   Skin: no rashes, no areas of induration  Neuro: normal tone, normal sensation to touch Psych:  normal insight, alert and oriented MSK: Normal gait, normal strength     ASSESSMENT & PLAN:   Anxiety state Has seen psychiatry and will have ongoing counseling sessions. -Zoloft was increased and had BuSpar added. -Completed her disability paperwork today -She will follow-up in 3 to 4 weeks.   The above documentation has been reviewed and is accurate and complete. Clearance Coots, MD 09/11/2018, 3:36 PM>

## 2018-09-08 ENCOUNTER — Ambulatory Visit (INDEPENDENT_AMBULATORY_CARE_PROVIDER_SITE_OTHER): Payer: BC Managed Care – PPO | Admitting: Family Medicine

## 2018-09-08 ENCOUNTER — Encounter: Payer: Self-pay | Admitting: Family Medicine

## 2018-09-08 VITALS — BP 130/80 | HR 85 | Ht 63.0 in | Wt 201.0 lb

## 2018-09-08 DIAGNOSIS — F4310 Post-traumatic stress disorder, unspecified: Secondary | ICD-10-CM

## 2018-09-08 DIAGNOSIS — F411 Generalized anxiety disorder: Secondary | ICD-10-CM | POA: Diagnosis not present

## 2018-09-11 MED ORDER — SERTRALINE HCL 25 MG PO TABS: 75.0000 mg | ORAL_TABLET | Freq: Every day | ORAL | 1 refills | Status: DC

## 2018-09-11 NOTE — Assessment & Plan Note (Signed)
Has seen psychiatry and will have ongoing counseling sessions. -Zoloft was increased and had BuSpar added. -Completed her disability paperwork today -She will follow-up in 3 to 4 weeks.

## 2018-09-19 ENCOUNTER — Ambulatory Visit (INDEPENDENT_AMBULATORY_CARE_PROVIDER_SITE_OTHER): Payer: BC Managed Care – PPO | Admitting: Family Medicine

## 2018-09-19 ENCOUNTER — Encounter: Payer: Self-pay | Admitting: Family Medicine

## 2018-09-19 VITALS — BP 118/78 | HR 77 | Ht 63.0 in | Wt 199.0 lb

## 2018-09-19 DIAGNOSIS — F411 Generalized anxiety disorder: Secondary | ICD-10-CM

## 2018-09-19 DIAGNOSIS — F4310 Post-traumatic stress disorder, unspecified: Secondary | ICD-10-CM

## 2018-09-19 NOTE — Patient Instructions (Signed)
Good to see you  I'll get the paperwork done soon

## 2018-09-19 NOTE — Progress Notes (Signed)
Rebecca [name] - 62 y.o. female MRN [id number]  Date of birth: [birth year]  SUBJECTIVE:  Including CC & ROS.  Chief Complaint  Patient presents with  . Neck Pain  . Headache    Rebecca [name] is a 61 y.o. female that is following up for her anxiety and PTSD.  She has had increased stress from work.  She is having headaches and bouts of dizziness.  She has been taking the Zoloft.  Has not been able to schedule to see her psychiatrist until December.      Review of Systems  Constitutional: Negative for fever.  HENT: Negative for congestion.   Respiratory: Negative for cough.   Cardiovascular: Negative for chest pain.  Gastrointestinal: Negative for abdominal pain.  Musculoskeletal: Negative for back pain.  Skin: Negative for color change.  Neurological: Positive for dizziness and headaches.  Psychiatric/Behavioral: Negative for agitation. The patient is nervous/anxious.     HISTORY: Past Medical, Surgical, Social, and Family History Reviewed & Updated per EMR.   Pertinent Historical Findings include:  Past Medical History:  Diagnosis Date  . Hyperlipidemia   . Obesity   . Peritonsillar abscess 02/03/2017  . Thyroid disease    monitoring a nodule 1/9    Past Surgical History:  Procedure Laterality Date  . ABDOMINAL HYSTERECTOMY      Allergies  Allergen Reactions  . Prednisolone Other (See Comments)    Patient can't remember  Other reaction(s): Delusions (intolerance) Patient can't remember     Family History  Problem Relation Age of Onset  . Cancer Mother        pancreatic cancer  . Diabetes Mother   . Heart disease Mother        rheumatic heart disease  . Hyperlipidemia Mother   . COPD Sister   . Sleep apnea Sister   . Cancer Sister        Lung Cancer  . Cancer Brother        lung cancer  . Cancer Father        renal, bone  . Diabetes Father   . Hyperlipidemia Father   . Heart attack Maternal Grandfather   . Kidney disease Brother   . Colon cancer  Maternal Aunt      Social History   Socioeconomic History  . Marital status: Single    Spouse name: N/A  . Number of children: 0  . Years of education: 18.5  . Highest education level: Not on file  Occupational History  . Occupation: Retired    Fish farm manager: Psychologist, sport and exercise    Comment: [employer] x 30 years  . 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 Financial resource strain: Not hard at all  . Food insecurity:    Worry: Never true    Inability: Never true  . Transportation needs:    Medical: No    Non-medical: No  Tobacco Use  . Smoking status: Never Smoker  . Smokeless tobacco: Never Used  Substance and Sexual Activity  . Alcohol use: Yes    Alcohol/week: 5.0 - 7.0 standard drinks    Types: 5 - 7 Standard drinks or equivalent per week    Comment: 4-5 * week/ 1-2 drinks  . Drug use: No  . Sexual activity: Yes    Partners: Male    Birth control/protection: Surgical  Lifestyle  . Physical activity:    Days per week: 0 days    Minutes per session:  0 min  . Stress: Very much  Relationships  . Social connections:    Talks on phone: More than three times a week    Gets together: More than three times a week    Attends religious service: More than 4 times per year    Active member of club or organization: Yes    Attends meetings of clubs or organizations: More than 4 times per year    Relationship status: Not on file  . Intimate partner violence:    Fear of current or ex partner: No    Emotionally abused: No    Physically abused: No    Forced sexual activity: No  Other Topics Concern  . Not on file  Social History Narrative   Close friend/significant other killed (GSW) 2012.   Retired after 30 years, and took another job (commutes to [place] 4 days/week).     PHYSICAL EXAM:  VS: BP 118/78   Pulse 77   Ht 5\' 3"  (1.6 m)   Wt 199 lb (90.3 kg)   SpO2 96%   BMI 35.25 kg/m  Physical Exam Gen:  NAD, alert, cooperative with exam, well-appearing ENT: normal lips, normal nasal mucosa,  Eye: normal EOM, normal conjunctiva and lids CV:  no edema, +2 pedal pulses   Resp: no accessory muscle use, non-labored,  Skin: no rashes, no areas of induration  Neuro: normal tone, normal sensation to touch Psych:  normal insight, alert and oriented MSK: Normal gait, normal strength     ASSESSMENT & PLAN:   Anxiety state Having increased anxiety and symptoms most likely related to the stress she is experiencing at work. -She is provided continuous FMLA which I will fill out and fax back to her office. -Continue the Zoloft for now. -Could consider having her see social work at a [facility] location depending on availability.

## 2018-09-20 NOTE — Assessment & Plan Note (Signed)
Having increased anxiety and symptoms most likely related to the stress she is experiencing at work. -She is provided continuous FMLA which I will fill out and fax back to her office. -Continue the Zoloft for now. -Could consider having her see social work at a [facility] location depending on availability.

## 2018-09-22 DIAGNOSIS — Z0279 Encounter for issue of other medical certificate: Secondary | ICD-10-CM

## 2018-09-22 NOTE — Telephone Encounter (Signed)
Forms have been charged for. Copy sent to scan.   Patient has picked up original.

## 2018-09-27 ENCOUNTER — Ambulatory Visit (INDEPENDENT_AMBULATORY_CARE_PROVIDER_SITE_OTHER): Payer: BC Managed Care – PPO | Admitting: Psychology

## 2018-09-27 DIAGNOSIS — F4310 Post-traumatic stress disorder, unspecified: Secondary | ICD-10-CM | POA: Diagnosis not present

## 2018-09-27 DIAGNOSIS — F4322 Adjustment disorder with anxiety: Secondary | ICD-10-CM | POA: Diagnosis not present

## 2018-09-29 ENCOUNTER — Ambulatory Visit (INDEPENDENT_AMBULATORY_CARE_PROVIDER_SITE_OTHER): Payer: BC Managed Care – PPO | Admitting: Family Medicine

## 2018-09-29 ENCOUNTER — Encounter: Payer: Self-pay | Admitting: Family Medicine

## 2018-09-29 VITALS — BP 134/62 | HR 88 | Temp 98.0°F | Ht 63.0 in | Wt 202.4 lb

## 2018-09-29 DIAGNOSIS — E782 Mixed hyperlipidemia: Secondary | ICD-10-CM | POA: Diagnosis not present

## 2018-09-29 DIAGNOSIS — R05 Cough: Secondary | ICD-10-CM

## 2018-09-29 DIAGNOSIS — J029 Acute pharyngitis, unspecified: Secondary | ICD-10-CM

## 2018-09-29 DIAGNOSIS — E669 Obesity, unspecified: Secondary | ICD-10-CM | POA: Diagnosis not present

## 2018-09-29 DIAGNOSIS — R059 Cough, unspecified: Secondary | ICD-10-CM

## 2018-09-29 DIAGNOSIS — J040 Acute laryngitis: Secondary | ICD-10-CM

## 2018-09-29 DIAGNOSIS — J069 Acute upper respiratory infection, unspecified: Secondary | ICD-10-CM | POA: Diagnosis not present

## 2018-09-29 DIAGNOSIS — R7303 Prediabetes: Secondary | ICD-10-CM | POA: Diagnosis not present

## 2018-09-29 DIAGNOSIS — Z09 Encounter for follow-up examination after completed treatment for conditions other than malignant neoplasm: Secondary | ICD-10-CM

## 2018-09-29 DIAGNOSIS — F419 Anxiety disorder, unspecified: Secondary | ICD-10-CM

## 2018-09-29 MED ORDER — AMOXICILLIN-POT CLAVULANATE 875-125 MG PO TABS: 1.0000 | ORAL_TABLET | Freq: Two times a day (BID) | ORAL | 0 refills | Status: AC

## 2018-09-29 NOTE — Progress Notes (Signed)
Sick Visit  Subjective:    Patient ID: Rebecca [name], female    DOB: 1956/03/13, 62 y.o.   MRN: [id number]   No chief complaint on file.  HPI Rebecca [name] is a 62 year old female with a past medical history of Thyroid Disease, Peritonsillar Abscess, Obesity, and Hyperlipidemia.   Current Status: Laryngitis, congestion, and cough, that worsens at night. She states that she has been having these symptoms for about 1 month now without relief. She has been taking Theraflu, Mucinex, and cough medicine, with minimal relief. She continues to have moderate anxiety, which she follows up with Psychiatry as needed. She denies suicidal ideations, homicidal ideations, or auditory hallucinations.  She denies fevers, chills, fatigue, recent infections, weight loss, and night sweats. She has not had any headaches, visual changes, dizziness, and falls. No chest pain, heart palpitations, and shortness of breath reported. No reports of GI problems such as nausea, vomiting, diarrhea, and constipation. She has no reports of blood in stools, dysuria and hematuria. She denies pain today.   Past Medical History:  Diagnosis Date  . Hyperlipidemia   . Obesity   . Peritonsillar abscess 02/03/2017  . Thyroid disease    monitoring a nodule 1/9     Family History  Problem Relation Age of Onset  . Cancer Mother        pancreatic cancer  . Diabetes Mother   . Heart disease Mother        rheumatic heart disease  . Hyperlipidemia Mother   . COPD Sister   . Sleep apnea Sister   . Cancer Sister        Lung Cancer  . Cancer Brother        lung cancer  . Cancer Father        renal, bone  . Diabetes Father   . Hyperlipidemia Father   . Heart attack Maternal Grandfather   . Kidney disease Brother   . Colon cancer Maternal Aunt     Social History   Socioeconomic History  . Marital status: Single    Spouse name: N/A  . Number of children: 0  . Years of education: 18.5  . Highest education level: Not on  file  Occupational History  . Occupation: Retired    Fish farm manager: Psychologist, sport and exercise    Comment: [employer] x 30 years  . 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 Financial resource strain: Not hard at all  . Food insecurity:    Worry: Never true    Inability: Never true  . Transportation needs:    Medical: No    Non-medical: No  Tobacco Use  . Smoking status: Never Smoker  . Smokeless tobacco: Never Used  Substance and Sexual Activity  . Alcohol use: Yes    Alcohol/week: 5.0 - 7.0 standard drinks    Types: 5 - 7 Standard drinks or equivalent per week    Comment: 4-5 * week/ 1-2 drinks  . Drug use: No  . Sexual activity: Yes    Partners: Male    Birth control/protection: Surgical  Lifestyle  . Physical activity:    Days per week: 0 days    Minutes per session: 0 min  . Stress: Very much  Relationships  . Social connections:    Talks on phone: More than three times a week    Gets together: More than three times a week    Attends religious service: More than  4 times per year    Active member of club or organization: Yes    Attends meetings of clubs or organizations: More than 4 times per year    Relationship status: Not on file  . Intimate partner violence:    Fear of current or ex partner: No    Emotionally abused: No    Physically abused: No    Forced sexual activity: No  Other Topics Concern  . Not on file  Social History Narrative   Close friend/significant other killed (GSW) 2012.   Retired after 30 years, and took another job (commutes to [place] 4 days/week).    Past Surgical History:  Procedure Laterality Date  . ABDOMINAL HYSTERECTOMY      Immunization History  Administered Date(s) Administered  . Influenza Split 08/10/2012  . Td 04/27/2013    Current Meds  Medication Sig  . acetaminophen (TYLENOL) 500 MG tablet Take 1 tablet (500 mg total) by mouth every 6 (six) hours as needed.  .  busPIRone (BUSPAR) 7.5 MG tablet Take 1 tablet (7.5 mg total) by mouth daily.  . cholecalciferol (VITAMIN D) 1000 units tablet Take 1,000 Units by mouth daily.  . cyclobenzaprine (FLEXERIL) 10 MG tablet Take 1 tablet (10 mg total) by mouth 3 (three) times daily as needed for muscle spasms.  Marland Kitchen ibuprofen (ADVIL,MOTRIN) 600 MG tablet Take 1 tablet (600 mg total) by mouth every 6 (six) hours as needed.  . pyridOXINE (VITAMIN B-6) 100 MG tablet Take 100 mg by mouth daily.  . sertraline (ZOLOFT) 25 MG tablet Take 3 tablets (75 mg total) by mouth daily.  . traZODone (DESYREL) 50 MG tablet Take 0.5-1 tablets (25-50 mg total) by mouth at bedtime as needed for sleep.    Allergies  Allergen Reactions  . Prednisolone Other (See Comments)    Patient can't remember  Other reaction(s): Delusions (intolerance) Patient can't remember     BP 134/62   Pulse 88   Temp 98 F (36.7 C) (Oral)   Ht 5\' 3"  (1.6 m)   Wt 202 lb 6.4 oz (91.8 kg)   SpO2 100%   BMI 35.85 kg/m   Review of Systems  Constitutional: Negative.   HENT: Positive for congestion, sore throat, trouble swallowing and voice change (Laryngitis).   Eyes: Negative.   Respiratory: Positive for cough.   Musculoskeletal: Negative.   Psychiatric/Behavioral: The patient is nervous/anxious (Mild).      Objective:   Physical Exam  Constitutional: She is oriented to person, place, and time. She appears well-developed and well-nourished.  HENT:  Head: Normocephalic and atraumatic.  Right Ear: External ear normal.  Left Ear: External ear normal.  Swelling, erythema noted in right throat.   Eyes: Pupils are equal, round, and reactive to light. Conjunctivae and EOM are normal.  Neck: Normal range of motion. Neck supple.  Cardiovascular: Normal rate, regular rhythm, normal heart sounds and intact distal pulses.  Pulmonary/Chest: Effort normal and breath sounds normal.  Abdominal: Soft. Bowel sounds are normal.  Neurological: She is alert and  oriented to person, place, and time.  Skin: Skin is warm and dry.  Psychiatric: Her behavior is normal. Judgment and thought content normal.  Anxitey  Nursing note and vitals reviewed.  Assessment & Plan:   1. Throat infection She will gargle with warm water, use lozenges/drops, throat sprays, gargles, and teas are available for relief of pain related to pharyngitis. In addition, adjusting the temperature and texture of beverages and foods helps to relieve the pain  of swallowing, which can limit hydration and caloric intake during acute pharyngitis. - amoxicillin-clavulanate (AUGMENTIN) 875-125 MG tablet; Take 1 tablet by mouth 2 (two) times daily for 10 days.  Dispense: 20 tablet; Refill: 0  2. Viral upper respiratory tract infection We will initiate Augmentin today.  - amoxicillin-clavulanate (AUGMENTIN) 875-125 MG tablet; Take 1 tablet by mouth 2 (two) times daily for 10 days.  Dispense: 20 tablet; Refill: 0  3. Pharyngitis, unspecified etiology - amoxicillin-clavulanate (AUGMENTIN) 875-125 MG tablet; Take 1 tablet by mouth 2 (two) times daily for 10 days.  Dispense: 20 tablet; Refill: 0  4. Laryngitis Stable. Not worsening.   5. Cough Continue OTC cough suppressant as needed.   6. Anxiety Stable.   7. Follow up She will keep follow up appointment 11/2018.  Meds ordered this encounter  Medications  . amoxicillin-clavulanate (AUGMENTIN) 875-125 MG tablet    Sig: Take 1 tablet by mouth 2 (two) times daily for 10 days.    Dispense:  20 tablet    Refill:  0   Kathe Becton,  MSN, FNP-C [facility] [address] [place], [place] [address] [phone]

## 2018-09-29 NOTE — Patient Instructions (Signed)
Pharyngitis Pharyngitis is a sore throat (pharynx). There is redness, pain, and swelling of your throat. Follow these instructions at home:  Drink enough fluids to keep your pee (urine) clear or pale yellow.  Only take medicine as told by your doctor. ? You may get sick again if you do not take medicine as told. Finish your medicines, even if you start to feel better. ? Do not take aspirin.  Rest.  Rinse your mouth (gargle) with salt water ( tsp of salt per 1 qt of water) every 1-2 hours. This will help the pain.  If you are not at risk for choking, you can suck on hard candy or sore throat lozenges. Contact a doctor if:  You have large, tender lumps on your neck.  You have a rash.  You cough up green, yellow-brown, or bloody spit. Get help right away if:  You have a stiff neck.  You drool or cannot swallow liquids.  You throw up (vomit) or are not able to keep medicine or liquids down.  You have very bad pain that does not go away with medicine.  You have problems breathing (not from a stuffy nose). This information is not intended to replace advice given to you by your health care provider. Make sure you discuss any questions you have with your health care provider. Document Released: 04/12/2008 Document Revised: 04/01/2016 Document Reviewed: 07/02/2013 Elsevier Interactive Patient Education  2017 Elsevier Inc.    Amoxicillin; Clavulanic Acid tablets What is this medicine? AMOXICILLIN; CLAVULANIC ACID (a mox i SIL in; KLAV yoo lan ic AS id) is a penicillin antibiotic. It is used to treat certain kinds of bacterial infections. It will not work for colds, flu, or other viral infections. This medicine may be used for other purposes; ask your health care provider or pharmacist if you have questions. COMMON BRAND NAME(S): Augmentin What should I tell my health care provider before I take this medicine? They need to know if you have any of these conditions: -bowel  disease, like colitis -kidney disease -liver disease -mononucleosis -an unusual or allergic reaction to amoxicillin, penicillin, cephalosporin, other antibiotics, clavulanic acid, other medicines, foods, dyes, or preservatives -pregnant or trying to get pregnant -breast-feeding How should I use this medicine? Take this medicine by mouth with a full glass of water. Follow the directions on the prescription label. Take at the start of a meal. Do not crush or chew. If the tablet has a score line, you may cut it in half at the score line for easier swallowing. Take your medicine at regular intervals. Do not take your medicine more often than directed. Take all of your medicine as directed even if you think you are better. Do not skip doses or stop your medicine early. Talk to your pediatrician regarding the use of this medicine in children. Special care may be needed. Overdosage: If you think you have taken too much of this medicine contact a poison control center or emergency room at once. NOTE: This medicine is only for you. Do not share this medicine with others. What if I miss a dose? If you miss a dose, take it as soon as you can. If it is almost time for your next dose, take only that dose. Do not take double or extra doses. What may interact with this medicine? -allopurinol -anticoagulants -birth control pills -methotrexate -probenecid This list may not describe all possible interactions. Give your health care provider a list of all the medicines, herbs, non-prescription  drugs, or dietary supplements you use. Also tell them if you smoke, drink alcohol, or use illegal drugs. Some items may interact with your medicine. What should I watch for while using this medicine? Tell your doctor or health care professional if your symptoms do not improve. Do not treat diarrhea with over the counter products. Contact your doctor if you have diarrhea that lasts more than 2 days or if it is severe and  watery. If you have diabetes, you may get a false-positive result for sugar in your urine. Check with your doctor or health care professional. Birth control pills may not work properly while you are taking this medicine. Talk to your doctor about using an extra method of birth control. What side effects may I notice from receiving this medicine? Side effects that you should report to your doctor or health care professional as soon as possible: -allergic reactions like skin rash, itching or hives, swelling of the face, lips, or tongue -breathing problems -dark urine -fever or chills, sore throat -redness, blistering, peeling or loosening of the skin, including inside the mouth -seizures -trouble passing urine or change in the amount of urine -unusual bleeding, bruising -unusually weak or tired -white patches or sores in the mouth or throat Side effects that usually do not require medical attention (report to your doctor or health care professional if they continue or are bothersome): -diarrhea -dizziness -headache -nausea, vomiting -stomach upset -vaginal or anal irritation This list may not describe all possible side effects. Call your doctor for medical advice about side effects. You may report side effects to FDA at 1-800-FDA-1088. Where should I keep my medicine? Keep out of the reach of children. Store at room temperature below 25 degrees C (77 degrees F). Keep container tightly closed. Throw away any unused medicine after the expiration date. NOTE: This sheet is a summary. It may not cover all possible information. If you have questions about this medicine, talk to your doctor, pharmacist, or health care provider.  2018 Elsevier/Gold Standard (2008-01-18 12:04:30)

## 2018-10-10 ENCOUNTER — Ambulatory Visit: Payer: BC Managed Care – PPO | Admitting: Psychology

## 2018-10-11 ENCOUNTER — Ambulatory Visit (INDEPENDENT_AMBULATORY_CARE_PROVIDER_SITE_OTHER): Payer: BC Managed Care – PPO | Admitting: Psychology

## 2018-10-11 DIAGNOSIS — F4310 Post-traumatic stress disorder, unspecified: Secondary | ICD-10-CM | POA: Diagnosis not present

## 2018-10-13 ENCOUNTER — Ambulatory Visit (INDEPENDENT_AMBULATORY_CARE_PROVIDER_SITE_OTHER): Payer: BC Managed Care – PPO | Admitting: Family Medicine

## 2018-10-13 ENCOUNTER — Encounter: Payer: Self-pay | Admitting: Family Medicine

## 2018-10-13 VITALS — BP 144/82 | HR 66 | Resp 16 | Wt 206.0 lb

## 2018-10-13 DIAGNOSIS — F4310 Post-traumatic stress disorder, unspecified: Secondary | ICD-10-CM | POA: Diagnosis not present

## 2018-10-13 DIAGNOSIS — M542 Cervicalgia: Secondary | ICD-10-CM

## 2018-10-13 MED ORDER — IBUPROFEN-FAMOTIDINE 800-26.6 MG PO TABS: 1.0000 | ORAL_TABLET | Freq: Three times a day (TID) | ORAL | 3 refills | Status: DC

## 2018-10-13 NOTE — Patient Instructions (Signed)
Good to see you  Please take the duexis on the day of driving  Please try massage on the area.  Please try seeing the counselor at [facility] for EMDR for PTSD  Please see me back when you need to.

## 2018-10-13 NOTE — Progress Notes (Signed)
Rebecca [name] - 62 y.o. female MRN [id number]  Date of birth: [birth year]  SUBJECTIVE:  Including CC & ROS.  No chief complaint on file.   Rebecca [name] is a 62 y.o. female that is following up for anxiety, PTSD after her concussion.  She has been put on Zoloft for the depression, anxiety, and PTSD.  She reports having improvement with this medicine.  She is still experiencing intermittent pain on the left trapezius and neck.  Reports having this when she is riding in a car.  It seems to be localized to this area and has not taken anything for the pain.  Has been put on FMLA for work.  Her short-term disability is still pending.   Review of Systems  Constitutional: Negative for fever.  HENT: Negative for congestion.   Respiratory: Negative for cough.   Cardiovascular: Negative for chest pain.  Gastrointestinal: Negative for abdominal pain.  Musculoskeletal: Positive for neck pain.  Skin: Negative for color change.  Neurological: Positive for headaches.  Hematological: Negative for adenopathy.  Psychiatric/Behavioral: Negative for agitation.    HISTORY: Past Medical, Surgical, Social, and Family History Reviewed & Updated per EMR.   Pertinent Historical Findings include:  Past Medical History:  Diagnosis Date  . Hyperlipidemia   . Obesity   . Peritonsillar abscess 02/03/2017  . Thyroid disease    monitoring a nodule 1/9    Past Surgical History:  Procedure Laterality Date  . ABDOMINAL HYSTERECTOMY      Allergies  Allergen Reactions  . Prednisolone Other (See Comments)    Patient can't remember  Other reaction(s): Delusions (intolerance) Patient can't remember     Family History  Problem Relation Age of Onset  . Cancer Mother        pancreatic cancer  . Diabetes Mother   . Heart disease Mother        rheumatic heart disease  . Hyperlipidemia Mother   . COPD Sister   . Sleep apnea Sister   . Cancer Sister        Lung Cancer  . Cancer Brother        lung  cancer  . Cancer Father        renal, bone  . Diabetes Father   . Hyperlipidemia Father   . Heart attack Maternal Grandfather   . Kidney disease Brother   . Colon cancer Maternal Aunt      Social History   Socioeconomic History  . Marital status: Single    Spouse name: N/A  . Number of children: 0  . Years of education: 18.5  . Highest education level: Not on file  Occupational History  . Occupation: Retired    Fish farm manager: Psychologist, sport and exercise    Comment: [employer] x 30 years  . 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 Financial resource strain: Not hard at all  . Food insecurity:    Worry: Never true    Inability: Never true  . Transportation needs:    Medical: No    Non-medical: No  Tobacco Use  . Smoking status: Never Smoker  . Smokeless tobacco: Never Used  Substance and Sexual Activity  . Alcohol use: Yes    Alcohol/week: 5.0 - 7.0 standard drinks    Types: 5 - 7 Standard drinks or equivalent per week    Comment: 4-5 * week/ 1-2 drinks  . Drug use: No  . Sexual activity: Yes    Partners:  Male    Birth control/protection: Surgical  Lifestyle  . Physical activity:    Days per week: 0 days    Minutes per session: 0 min  . Stress: Very much  Relationships  . Social connections:    Talks on phone: More than three times a week    Gets together: More than three times a week    Attends religious service: More than 4 times per year    Active member of club or organization: Yes    Attends meetings of clubs or organizations: More than 4 times per year    Relationship status: Not on file  . Intimate partner violence:    Fear of current or ex partner: No    Emotionally abused: No    Physically abused: No    Forced sexual activity: No  Other Topics Concern  . Not on file  Social History Narrative   Close friend/significant other killed (GSW) 2012.   Retired after 30 years, and took another job  (commutes to [place] 4 days/week).     PHYSICAL EXAM:  VS: BP (!) 144/82   Pulse 66   Resp 16   Wt 206 lb (93.4 kg)   SpO2 98%   BMI 36.49 kg/m  Physical Exam Gen: NAD, alert, cooperative with exam, well-appearing ENT: normal lips, normal nasal mucosa,  Eye: normal EOM, normal conjunctiva and lids CV:  no edema, +2 pedal pulses   Resp: no accessory muscle use, non-labored,  Skin: no rashes, no areas of induration  Neuro: normal tone, normal sensation to touch Psych:  normal insight, alert and oriented MSK:  Neck: Some tenderness to palpation over the left trapezius. Normal neck range of motion. Normal strength resistance. Normal strength resistance with shrug. Normal shoulder range of motion. Normal grip strength. Neurovascular intact     ASSESSMENT & PLAN:   PTSD (post-traumatic stress disorder) Seems to exacerbate if she is in the car  - can try EMDR. SW at [facility] can perform this.   Neck pain Likely related to posture and stress  - duexis - can consider trigger point injections.

## 2018-10-13 NOTE — Assessment & Plan Note (Signed)
Seems to exacerbate if she is in the car  - can try EMDR. SW at [facility] can perform this.

## 2018-10-13 NOTE — Assessment & Plan Note (Signed)
Likely related to posture and stress  - duexis - can consider trigger point injections.

## 2018-10-25 ENCOUNTER — Ambulatory Visit (INDEPENDENT_AMBULATORY_CARE_PROVIDER_SITE_OTHER): Payer: BC Managed Care – PPO | Admitting: Psychology

## 2018-10-25 DIAGNOSIS — F4310 Post-traumatic stress disorder, unspecified: Secondary | ICD-10-CM

## 2018-10-25 DIAGNOSIS — F4322 Adjustment disorder with anxiety: Secondary | ICD-10-CM

## 2018-11-06 ENCOUNTER — Ambulatory Visit (HOSPITAL_COMMUNITY): Payer: BC Managed Care – PPO | Admitting: Psychiatry

## 2018-11-06 ENCOUNTER — Encounter

## 2018-11-10 ENCOUNTER — Ambulatory Visit (INDEPENDENT_AMBULATORY_CARE_PROVIDER_SITE_OTHER): Payer: BC Managed Care – PPO | Admitting: Family Medicine

## 2018-11-10 ENCOUNTER — Encounter: Payer: Self-pay | Admitting: Family Medicine

## 2018-11-10 VITALS — BP 164/92 | HR 81 | Resp 16 | Ht 63.0 in | Wt 214.0 lb

## 2018-11-10 DIAGNOSIS — F4310 Post-traumatic stress disorder, unspecified: Secondary | ICD-10-CM | POA: Diagnosis not present

## 2018-11-10 NOTE — Progress Notes (Signed)
Rebecca [name] - 63 y.o. female MRN [id number]  Date of birth: [birth year]  SUBJECTIVE:  Including CC & ROS.  No chief complaint on file.   Rebecca [name] is a 63 y.o. female that is following up for her anxiety, PTSD.  She has seen psychology.  She has been taking the Zoloft fairly readily.  She has been granted short-term disability.  She has not been taking the BuSpar as regulated.  She still has trouble traveling in the car as this reproduces symptoms.  Has been able to sleep still except really has not been exercising like she wants to.  Plans on setting goals for herself.    Review of Systems  Constitutional: Negative for fever.  HENT: Negative for congestion.   Respiratory: Negative for cough.   Cardiovascular: Negative for chest pain.  Gastrointestinal: Negative for abdominal pain.  Musculoskeletal: Negative for gait problem.  Skin: Negative for color change.  Psychiatric/Behavioral: The patient is nervous/anxious.     HISTORY: Past Medical, Surgical, Social, and Family History Reviewed & Updated per EMR.   Pertinent Historical Findings include:  Past Medical History:  Diagnosis Date  . Hyperlipidemia   . Obesity   . Peritonsillar abscess 02/03/2017  . Thyroid disease    monitoring a nodule 1/9    Past Surgical History:  Procedure Laterality Date  . ABDOMINAL HYSTERECTOMY      Allergies  Allergen Reactions  . Prednisolone Other (See Comments)    Patient can't remember  Other reaction(s): Delusions (intolerance) Patient can't remember     Family History  Problem Relation Age of Onset  . Cancer Mother        pancreatic cancer  . Diabetes Mother   . Heart disease Mother        rheumatic heart disease  . Hyperlipidemia Mother   . COPD Sister   . Sleep apnea Sister   . Cancer Sister        Lung Cancer  . Cancer Brother        lung cancer  . Cancer Father        renal, bone  . Diabetes Father   . Hyperlipidemia Father   . Heart attack Maternal  Grandfather   . Kidney disease Brother   . Colon cancer Maternal Aunt      Social History   Socioeconomic History  . Marital status: Single    Spouse name: N/A  . Number of children: 0  . Years of education: 18.5  . Highest education level: Not on file  Occupational History  . Occupation: Retired    Fish farm manager: Psychologist, sport and exercise    Comment: [employer] x 30 years  . 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 Financial resource strain: Not hard at all  . Food insecurity:    Worry: Never true    Inability: Never true  . Transportation needs:    Medical: No    Non-medical: No  Tobacco Use  . Smoking status: Never Smoker  . Smokeless tobacco: Never Used  Substance and Sexual Activity  . Alcohol use: Yes    Alcohol/week: 5.0 - 7.0 standard drinks    Types: 5 - 7 Standard drinks or equivalent per week    Comment: 4-5 * week/ 1-2 drinks  . Drug use: No  . Sexual activity: Yes    Partners: Male    Birth control/protection: Surgical  Lifestyle  . Physical activity:    Days per  week: 0 days    Minutes per session: 0 min  . Stress: Very much  Relationships  . Social connections:    Talks on phone: More than three times a week    Gets together: More than three times a week    Attends religious service: More than 4 times per year    Active member of club or organization: Yes    Attends meetings of clubs or organizations: More than 4 times per year    Relationship status: Not on file  . Intimate partner violence:    Fear of current or ex partner: No    Emotionally abused: No    Physically abused: No    Forced sexual activity: No  Other Topics Concern  . Not on file  Social History Narrative   Close friend/significant other killed (GSW) 2012.   Retired after 30 years, and took another job (commutes to [place] 4 days/week).     PHYSICAL EXAM:  VS: BP (!) 164/92   Pulse 81   Resp 16   Ht 5\' 3"  (1.6 m)   Wt  214 lb (97.1 kg)   SpO2 98%   BMI 37.91 kg/m  Physical Exam Gen: NAD, alert, cooperative with exam, well-appearing ENT: normal lips, normal nasal mucosa,  Eye: normal EOM, normal conjunctiva and lids CV:  no edema, +2 pedal pulses   Resp: no accessory muscle use, non-labored,  Skin: no rashes, no areas of induration  Neuro: normal tone, normal sensation to touch Psych:  normal insight, alert and oriented MSK: Normal gait, normal strength     ASSESSMENT & PLAN:   PTSD (post-traumatic stress disorder) Still has ongoing symptoms when she is traveling in the car. -Referral to [facility] for the consideration of EMDR.

## 2018-11-10 NOTE — Patient Instructions (Signed)
Good to see you  Glad you had a good holiday season.  I will make a referral to [facility] and they will be in contact to schedule.

## 2018-11-13 NOTE — Assessment & Plan Note (Signed)
Still has ongoing symptoms when she is traveling in the car. -Referral to [facility] for the consideration of EMDR.

## 2018-11-22 ENCOUNTER — Ambulatory Visit (INDEPENDENT_AMBULATORY_CARE_PROVIDER_SITE_OTHER): Payer: BC Managed Care – PPO | Admitting: Psychology

## 2018-11-22 DIAGNOSIS — F4322 Adjustment disorder with anxiety: Secondary | ICD-10-CM | POA: Diagnosis not present

## 2018-11-22 DIAGNOSIS — F4310 Post-traumatic stress disorder, unspecified: Secondary | ICD-10-CM | POA: Diagnosis not present

## 2018-11-24 ENCOUNTER — Telehealth: Payer: Self-pay

## 2018-11-24 ENCOUNTER — Encounter: Payer: Self-pay | Admitting: Family Medicine

## 2018-11-24 ENCOUNTER — Ambulatory Visit (INDEPENDENT_AMBULATORY_CARE_PROVIDER_SITE_OTHER): Payer: BC Managed Care – PPO | Admitting: Family Medicine

## 2018-11-24 VITALS — BP 128/74 | HR 74 | Temp 98.0°F | Ht 63.0 in | Wt 209.0 lb

## 2018-11-24 DIAGNOSIS — J040 Acute laryngitis: Secondary | ICD-10-CM | POA: Diagnosis not present

## 2018-11-24 DIAGNOSIS — F4310 Post-traumatic stress disorder, unspecified: Secondary | ICD-10-CM | POA: Diagnosis not present

## 2018-11-24 DIAGNOSIS — F419 Anxiety disorder, unspecified: Secondary | ICD-10-CM

## 2018-11-24 DIAGNOSIS — Z131 Encounter for screening for diabetes mellitus: Secondary | ICD-10-CM

## 2018-11-24 DIAGNOSIS — Z09 Encounter for follow-up examination after completed treatment for conditions other than malignant neoplasm: Secondary | ICD-10-CM

## 2018-11-24 LAB — POCT URINALYSIS DIP (MANUAL ENTRY)
Bilirubin, UA: NEGATIVE
Blood, UA: NEGATIVE
Glucose, UA: NEGATIVE mg/dL
Ketones, POC UA: NEGATIVE mg/dL
Leukocytes, UA: NEGATIVE
Nitrite, UA: NEGATIVE
Protein Ur, POC: NEGATIVE mg/dL
Spec Grav, UA: 1.01 (ref 1.010–1.025)
Urobilinogen, UA: 0.2 E.U./dL
pH, UA: 7.5 (ref 5.0–8.0)

## 2018-11-24 LAB — POCT GLYCOSYLATED HEMOGLOBIN (HGB A1C): Hemoglobin A1C: 5.2 % (ref 4.0–5.6)

## 2018-11-24 NOTE — Patient Instructions (Signed)
Vegan Diet A vegan diet excludes all foods that come from animals, including foods that are made from meat, fish, poultry, dairy, and eggs. A vegan diet may be followed for ethical or health reasons. What are tips for following this plan? While following this diet, it is important to find plant sources or supplements that contain the same nutrients you would find in the foods that come from animals. Talk with your health care provider or dietitian about whether you should be taking nutritional supplements. What do I need to know about the vegan diet?  This diet includes all foods that come from plants. Some people choose not to eat sea vegetables, such as seaweed.  This diet excludes all foods that come from animals.  A vegan diet lacks certain nutrients that are commonly found in animal products. These nutrients include: ? Protein. ? Vitamin B12. ? Vitamin D. ? Iron. ? Omega-3 fatty acids. ? Calcium. ? Zinc. What foods can I eat?  Fruits Any. Vegetables Any (except sea vegetables, such as kelp and seaweed, if you choose not to eat them). Grains Any. Meats and other proteins Beans, such as black beans or kidney beans. Other legumes, such as lentils and split peas. Soy products. Nuts, such as almonds, [place] nuts, and pecans. Seeds, such as sunflower seeds. Tofu. Tempeh. Hummus. Dairy Any dairy product that is made with milk that comes from soy, almonds, rice, hemp, coconut, or any other type of nut. Fats and oils All vegetable-based oils, such as olive, canola, coconut, corn, safflower, peanut, and sesame oils. All vegan butters. Beverages Juice. Carbonated soft drinks. Tea. Sweets and desserts Any dessert that is labeled "vegan." Ice cream that is made with milk from soy, almond, rice, hemp, coconut, or other nuts and does not have other animal ingredients (such as chocolate chips that are made from cow milk). Vitamin B12 Breakfast cereals and prepared products that have added  vitamin B12. Nutritional yeast. Vitamin D Orange juice. Fortified mushrooms. Cereals with added vitamin D. Iron Dark leafy greens. Nuts. Beans. Grain products that have added iron, such as cereals. Tofu. Tempeh. Soybeans. Quinoa. Plant-based iron is absorbed better when it is eaten with a food that has vitamin C. Omega-3 fatty acids Walnuts. Foods with added omega-3 fatty acids, such as juices. Flax seeds. Hemp seeds. Canola oil and soybean oil. Tofu. Calcium Dark leafy greens, such as kale, bok choy, Chinese cabbage, collard greens, and mustard greens. Broccoli. Okra. Soy products with added calcium. Calcium-fortified breakfast cereals. Calcium-fortified fruit juices. Figs. Zinc Wheat germ, cereals, and breads that have added zinc. Baked beans. Legumes, such as cashews, chickpeas, kidney beans, and green peas. Almonds and nut butters. Tofu and other soy products. Seasonings and condiments Nutritional yeast. Salt. Pepper. Fresh herbs. Soy sauce. Vegetable broth. The items listed above may not be a complete list of foods and beverages you can eat. Contact a dietitian for more options. What foods should I avoid? Fruits No fruits need to be avoided. All fruits are included in this diet. Vegetables Sea vegetables, such as kelp and seaweed (if you choose not to eat them). Grains No grains need to be avoided. All grains are included in this diet. Meats and other proteins Any animal meats. Poultry. Eggs. Fish. Seafood. Dairy Milk, cheese, yogurt, or ice cream that is made from cow milk, goat milk, or sheep milk. Fats and oils Butter. Lard. Beverages Cow milk. Goat milk. Sheep milk. Drinks that are sweetened with honey. Sweets and desserts Any desserts that  are made with eggs or animal milk. Honey. Cheesecake. Seasonings and condiments Honey. Any condiments made with eggs or animal milk. The items listed above may not be a complete list of foods and beverages to avoid. Contact a dietitian  for more information. Summary  A vegan diet excludes any foods or drinks that come from an animal.  A vegan diet may be followed for ethical or health reasons.  When following this diet, you can become deficient in certain vitamins and minerals. These include vitamin B12, iron, calcium, and zinc. Talk with your health care provider or dietitian to make sure you get these important nutrients in your diet. This information is not intended to replace advice given to you by your health care provider. Make sure you discuss any questions you have with your health care provider. Document Released: 06/28/2014 Document Revised: 12/01/2017 Document Reviewed: 12/01/2017 Elsevier Interactive Patient Education  2019 [employer] Heart-healthy meal planning includes:  Eating less unhealthy fats.  Eating more healthy fats.  Making other changes in your diet. Talk with your doctor or a diet specialist (dietitian) to create an eating plan that is right for you. What is my plan? Your doctor may recommend an eating plan that includes:  Total fat: ______% or less of total calories a day.  Saturated fat: ______% or less of total calories a day.  Cholesterol: less than _________mg a day. What are tips for following this plan? Cooking Avoid frying your food. Try to bake, boil, grill, or broil it instead. You can also reduce fat by:  Removing the skin from poultry.  Removing all visible fats from meats.  Steaming vegetables in water or broth. Meal planning   At meals, divide your plate into four equal parts: ? Fill one-half of your plate with vegetables and green salads. ? Fill one-fourth of your plate with whole grains. ? Fill one-fourth of your plate with lean protein foods.  Eat 4-5 servings of vegetables per day. A serving of vegetables is: ? 1 cup of raw or cooked vegetables. ? 2 cups of raw leafy greens.  Eat 4-5 servings of fruit per day. A serving of fruit  is: ? 1 medium whole fruit. ?  cup of dried fruit. ?  cup of fresh, frozen, or canned fruit. ?  cup of 100% fruit juice.  Eat more foods that have soluble fiber. These are apples, broccoli, carrots, beans, peas, and barley. Try to get 20-30 g of fiber per day.  Eat 4-5 servings of nuts, legumes, and seeds per week: ? 1 serving of dried beans or legumes equals  cup after being cooked. ? 1 serving of nuts is  cup. ? 1 serving of seeds equals 1 tablespoon. General information  Eat more home-cooked food. Eat less restaurant, buffet, and fast food.  Limit or avoid alcohol.  Limit foods that are high in starch and sugar.  Avoid fried foods.  Lose weight if you are overweight.  Keep track of how much salt (sodium) you eat. This is important if you have high blood pressure. Ask your doctor to tell you more about this.  Try to add vegetarian meals each week. Fats  Choose healthy fats. These include olive oil and canola oil, flaxseeds, walnuts, almonds, and seeds.  Eat more omega-3 fats. These include salmon, mackerel, sardines, tuna, flaxseed oil, and ground flaxseeds. Try to eat fish at least 2 times each week.  Check food labels. Avoid foods with trans fats or high amounts  of saturated fat.  Limit saturated fats. ? These are often found in animal products, such as meats, butter, and cream. ? These are also found in plant foods, such as palm oil, palm kernel oil, and coconut oil.  Avoid foods with partially hydrogenated oils in them. These have trans fats. Examples are stick margarine, some tub margarines, cookies, crackers, and other baked goods. What foods can I eat? Fruits All fresh, canned (in natural juice), or frozen fruits. Vegetables Fresh or frozen vegetables (raw, steamed, roasted, or grilled). Green salads. Grains Most grains. Choose whole wheat and whole grains most of the time. Rice and pasta, including brown rice and pastas made with whole wheat. Meats and  other proteins Lean, well-trimmed beef, veal, pork, and lamb. Chicken and [place] without skin. All fish and shellfish. Wild duck, rabbit, pheasant, and venison. Egg whites or low-cholesterol egg substitutes. Dried beans, peas, lentils, and tofu. Seeds and most nuts. Dairy Low-fat or nonfat cheeses, including ricotta and mozzarella. Skim or 1% milk that is liquid, powdered, or evaporated. Buttermilk that is made with low-fat milk. Nonfat or low-fat yogurt. Fats and oils Non-hydrogenated (trans-free) margarines. Vegetable oils, including soybean, sesame, sunflower, olive, peanut, safflower, corn, canola, and cottonseed. Salad dressings or mayonnaise made with a vegetable oil. Beverages Mineral water. Coffee and tea. Diet carbonated beverages. Sweets and desserts Sherbet, gelatin, and fruit ice. Small amounts of dark chocolate. Limit all sweets and desserts. Seasonings and condiments All seasonings and condiments. The items listed above may not be a complete list of foods and drinks you can eat. Contact a dietitian for more options. What foods should I avoid? Fruits Canned fruit in heavy syrup. Fruit in cream or butter sauce. Fried fruit. Limit coconut. Vegetables Vegetables cooked in cheese, cream, or butter sauce. Fried vegetables. Grains Breads that are made with saturated or trans fats, oils, or whole milk. Croissants. Sweet rolls. Donuts. High-fat crackers, such as cheese crackers. Meats and other proteins Fatty meats, such as hot dogs, ribs, sausage, bacon, rib-eye roast or steak. High-fat deli meats, such as salami and bologna. Caviar. Domestic duck and goose. Organ meats, such as liver. Dairy Cream, sour cream, cream cheese, and creamed cottage cheese. Whole-milk cheeses. Whole or 2% milk that is liquid, evaporated, or condensed. Whole buttermilk. Cream sauce or high-fat cheese sauce. Yogurt that is made from whole milk. Fats and oils Meat fat, or shortening. Cocoa butter, hydrogenated  oils, palm oil, coconut oil, palm kernel oil. Solid fats and shortenings, including bacon fat, salt pork, lard, and butter. Nondairy cream substitutes. Salad dressings with cheese or sour cream. Beverages Regular sodas and juice drinks with added sugar. Sweets and desserts Frosting. Pudding. Cookies. Cakes. Pies. Milk chocolate or white chocolate. Buttered syrups. Full-fat ice cream or ice cream drinks. The items listed above may not be a complete list of foods and drinks to avoid. Contact a dietitian for more information. Summary  Heart-healthy meal planning includes eating less unhealthy fats, eating more healthy fats, and making other changes in your diet.  Eat a balanced diet. This includes fruits and vegetables, low-fat or nonfat dairy, lean protein, nuts and legumes, whole grains, and heart-healthy oils and fats. This information is not intended to replace advice given to you by your health care provider. Make sure you discuss any questions you have with your health care provider. Document Released: 04/25/2012 Document Revised: 12/02/2017 Document Reviewed: 12/02/2017 Elsevier Interactive Patient Education  2019 [employer] DASH Eating Plan DASH stands for "Dietary Approaches to Stop  Hypertension." The DASH eating plan is a healthy eating plan that has been shown to reduce high blood pressure (hypertension). It may also reduce your risk for type 2 diabetes, heart disease, and stroke. The DASH eating plan may also help with weight loss. What are tips for following this plan?  General guidelines  Avoid eating more than 2,300 mg (milligrams) of salt (sodium) a day. If you have hypertension, you may need to reduce your sodium intake to 1,500 mg a day.  Limit alcohol intake to no more than 1 drink a day for nonpregnant women and 2 drinks a day for men. One drink equals 12 oz of beer, 5 oz of wine, or 1 oz of hard liquor.  Work with your health care provider to maintain a healthy body  weight or to lose weight. Ask what an ideal weight is for you.  Get at least 30 minutes of exercise that causes your heart to beat faster (aerobic exercise) most days of the week. Activities may include walking, swimming, or biking.  Work with your health care provider or diet and nutrition specialist (dietitian) to adjust your eating plan to your individual calorie needs. Reading food labels   Check food labels for the amount of sodium per serving. Choose foods with less than 5 percent of the Daily Value of sodium. Generally, foods with less than 300 mg of sodium per serving fit into this eating plan.  To find whole grains, look for the word "whole" as the first word in the ingredient list. Shopping  Buy products labeled as "low-sodium" or "no salt added."  Buy fresh foods. Avoid canned foods and premade or frozen meals. Cooking  Avoid adding salt when cooking. Use salt-free seasonings or herbs instead of table salt or sea salt. Check with your health care provider or pharmacist before using salt substitutes.  Do not fry foods. Cook foods using healthy methods such as baking, boiling, grilling, and broiling instead.  Cook with heart-healthy oils, such as olive, canola, soybean, or sunflower oil. Meal planning  Eat a balanced diet that includes: ? 5 or more servings of fruits and vegetables each day. At each meal, try to fill half of your plate with fruits and vegetables. ? Up to 6-8 servings of whole grains each day. ? Less than 6 oz of lean meat, poultry, or fish each day. A 3-oz serving of meat is about the same size as a deck of cards. One egg equals 1 oz. ? 2 servings of low-fat dairy each day. ? A serving of nuts, seeds, or beans 5 times each week. ? Heart-healthy fats. Healthy fats called Omega-3 fatty acids are found in foods such as flaxseeds and coldwater fish, like sardines, salmon, and mackerel.  Limit how much you eat of the following: ? Canned or prepackaged  foods. ? Food that is high in trans fat, such as fried foods. ? Food that is high in saturated fat, such as fatty meat. ? Sweets, desserts, sugary drinks, and other foods with added sugar. ? Full-fat dairy products.  Do not salt foods before eating.  Try to eat at least 2 vegetarian meals each week.  Eat more home-cooked food and less restaurant, buffet, and fast food.  When eating at a restaurant, ask that your food be prepared with less salt or no salt, if possible. What foods are recommended? The items listed may not be a complete list. Talk with your dietitian about what dietary choices are best for you. Grains Whole-grain  or whole-wheat bread. Whole-grain or whole-wheat pasta. Brown rice. [place]. Bulgur. Whole-grain and low-sodium cereals. Pita bread. Low-fat, low-sodium crackers. Whole-wheat flour tortillas. Vegetables Fresh or frozen vegetables (raw, steamed, roasted, or grilled). Low-sodium or reduced-sodium tomato and vegetable juice. Low-sodium or reduced-sodium tomato sauce and tomato paste. Low-sodium or reduced-sodium canned vegetables. Fruits All fresh, dried, or frozen fruit. Canned fruit in natural juice (without added sugar). Meat and other protein foods Skinless chicken or [place]. Ground chicken or [place]. Pork with fat trimmed off. Fish and seafood. Egg whites. Dried beans, peas, or lentils. Unsalted nuts, nut butters, and seeds. Unsalted canned beans. Lean cuts of beef with fat trimmed off. Low-sodium, lean deli meat. Dairy Low-fat (1%) or fat-free (skim) milk. Fat-free, low-fat, or reduced-fat cheeses. Nonfat, low-sodium ricotta or cottage cheese. Low-fat or nonfat yogurt. Low-fat, low-sodium cheese. Fats and oils Soft margarine without trans fats. Vegetable oil. Low-fat, reduced-fat, or light mayonnaise and salad dressings (reduced-sodium). Canola, safflower, olive, soybean, and sunflower oils. Avocado. Seasoning and other foods Herbs. Spices. Seasoning  mixes without salt. Unsalted popcorn and pretzels. Fat-free sweets. What foods are not recommended? The items listed may not be a complete list. Talk with your dietitian about what dietary choices are best for you. Grains Baked goods made with fat, such as croissants, muffins, or some breads. Dry pasta or rice meal packs. Vegetables Creamed or fried vegetables. Vegetables in a cheese sauce. Regular canned vegetables (not low-sodium or reduced-sodium). Regular canned tomato sauce and paste (not low-sodium or reduced-sodium). Regular tomato and vegetable juice (not low-sodium or reduced-sodium). Angie Fava. Olives. Fruits Canned fruit in a light or heavy syrup. Fried fruit. Fruit in cream or butter sauce. Meat and other protein foods Fatty cuts of meat. Ribs. Fried meat. Berniece Salines. Sausage. Bologna and other processed lunch meats. Salami. Fatback. Hotdogs. Bratwurst. Salted nuts and seeds. Canned beans with added salt. Canned or smoked fish. Whole eggs or egg yolks. Chicken or [place] with skin. Dairy Whole or 2% milk, cream, and half-and-half. Whole or full-fat cream cheese. Whole-fat or sweetened yogurt. Full-fat cheese. Nondairy creamers. Whipped toppings. Processed cheese and cheese spreads. Fats and oils Butter. Stick margarine. Lard. Shortening. Ghee. Bacon fat. Tropical oils, such as coconut, palm kernel, or palm oil. Seasoning and other foods Salted popcorn and pretzels. Onion salt, garlic salt, seasoned salt, table salt, and sea salt. Worcestershire sauce. Tartar sauce. Barbecue sauce. Teriyaki sauce. Soy sauce, including reduced-sodium. Steak sauce. Canned and packaged gravies. Fish sauce. Oyster sauce. Cocktail sauce. Horseradish that you find on the shelf. Ketchup. Mustard. Meat flavorings and tenderizers. Bouillon cubes. Hot sauce and Tabasco sauce. Premade or packaged marinades. Premade or packaged taco seasonings. Relishes. Regular salad dressings. Where to find more information:  National  Heart, Lung, and [place]: https://wilson-eaton.com/  American Heart Association: www.heart.org Summary  The DASH eating plan is a healthy eating plan that has been shown to reduce high blood pressure (hypertension). It may also reduce your risk for type 2 diabetes, heart disease, and stroke.  With the DASH eating plan, you should limit salt (sodium) intake to 2,300 mg a day. If you have hypertension, you may need to reduce your sodium intake to 1,500 mg a day.  When on the DASH eating plan, aim to eat more fresh fruits and vegetables, whole grains, lean proteins, low-fat dairy, and heart-healthy fats.  Work with your health care provider or diet and nutrition specialist (dietitian) to adjust your eating plan to your individual calorie needs. This information is not intended to  replace advice given to you by your health care provider. Make sure you discuss any questions you have with your health care provider. Document Released: 10/14/2011 Document Revised: 10/18/2016 Document Reviewed: 10/18/2016 Elsevier Interactive Patient Education  2019 [employer].
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
## 2018-12-13 ENCOUNTER — Ambulatory Visit (INDEPENDENT_AMBULATORY_CARE_PROVIDER_SITE_OTHER): Payer: BC Managed Care – PPO | Admitting: Psychology

## 2018-12-13 DIAGNOSIS — F4310 Post-traumatic stress disorder, unspecified: Secondary | ICD-10-CM | POA: Diagnosis not present

## 2018-12-13 DIAGNOSIS — F4322 Adjustment disorder with anxiety: Secondary | ICD-10-CM | POA: Diagnosis not present

## 2018-12-19 ENCOUNTER — Encounter: Payer: Self-pay | Admitting: Family Medicine

## 2018-12-19 ENCOUNTER — Ambulatory Visit (INDEPENDENT_AMBULATORY_CARE_PROVIDER_SITE_OTHER): Payer: BC Managed Care – PPO | Admitting: Family Medicine

## 2018-12-19 VITALS — BP 160/90 | HR 73 | Temp 98.0°F | Ht 63.0 in

## 2018-12-19 DIAGNOSIS — F411 Generalized anxiety disorder: Secondary | ICD-10-CM | POA: Diagnosis not present

## 2018-12-19 DIAGNOSIS — F4310 Post-traumatic stress disorder, unspecified: Secondary | ICD-10-CM

## 2018-12-19 MED ORDER — SERTRALINE HCL 25 MG PO TABS: 75.0000 mg | ORAL_TABLET | Freq: Every day | ORAL | 1 refills | Status: DC

## 2018-12-19 NOTE — Assessment & Plan Note (Signed)
Appears stable at the current dose. - Refilled Zoloft today

## 2018-12-19 NOTE — Assessment & Plan Note (Signed)
Symptoms are still ongoing. -Referral to psych for EMDR

## 2018-12-19 NOTE — Patient Instructions (Signed)
Good to see you   
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
## 2019-01-03 ENCOUNTER — Ambulatory Visit: Payer: Self-pay | Admitting: Psychology

## 2019-01-15 ENCOUNTER — Encounter: Payer: Self-pay | Admitting: Family Medicine

## 2019-01-15 ENCOUNTER — Ambulatory Visit (INDEPENDENT_AMBULATORY_CARE_PROVIDER_SITE_OTHER): Payer: BC Managed Care – PPO | Admitting: Family Medicine

## 2019-01-15 DIAGNOSIS — F4310 Post-traumatic stress disorder, unspecified: Secondary | ICD-10-CM
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
## 2019-01-16 NOTE — Assessment & Plan Note (Signed)
Stable - has appt set up for further therapy.

## 2019-01-24 ENCOUNTER — Ambulatory Visit: Payer: BC Managed Care – PPO | Admitting: Psychology

## 2019-01-26 NOTE — Telephone Encounter (Signed)
error 

## 2019-02-14 ENCOUNTER — Telehealth: Payer: Self-pay

## 2019-02-14 NOTE — Telephone Encounter (Signed)
Questions for Screening COVID-19  Symptom onset: n/a  Travel or Contacts: no travel no contact  During this illness, did/does the patient experience any of the following symptoms? Fever >100.65F []   Yes [x]   No []   Unknown Subjective fever (felt feverish) []   Yes [x]   No []   Unknown Chills []   Yes [x]   No []   Unknown Muscle aches (myalgia) []   Yes [x]   No []   Unknown Runny nose (rhinorrhea) []   Yes [x]   No []   Unknown Sore throat []   Yes [x]   No []   Unknown Cough (new onset or worsening of chronic cough) []   Yes [x]   No []   Unknown Shortness of breath (dyspnea) []   Yes [x]   No []   Unknown Nausea or vomiting []   Yes [x]   No []   Unknown Headache []   Yes [x]   No []   Unknown Abdominal pain  []   Yes [x]   No []   Unknown Diarrhea (?3 loose/looser than normal stools/24hr period) []   Yes [x]   No []   Unknown Other, specify:  Patient risk factors: Smoker? []   Current []   Former [x]   Never If female, currently pregnant? []   Yes [x]   No  Patient Active Problem List   Diagnosis Date Noted  . PTSD (post-traumatic stress disorder) 09/06/2018  . Concussion with no loss of consciousness 03/17/2018  . Elevated blood pressure 07/16/2016  . Anxiety state 07/03/2016  . Neck pain 07/03/2016  . 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 Acid reflux disease 11/27/2014  . Special screening for malignant neoplasms, colon 09/17/2013  . Obesity, unspecified 01/16/2013  . Spinal stenosis in cervical region 01/11/2013  . Multiple thyroid nodules 01/11/2013  . Hand pain 06/16/2012  . Hyperlipidemia 06/16/2012    Plan:  []   High risk for COVID-19 with red flags go to ED (with CP, SOB, weak/lightheaded, or fever > 101.5). Call ahead.  []   High risk for COVID-19 but stable. Inform provider and coordinate time for [facility] visit.   []   No red flags but URI signs or symptoms okay for [facility] visit.

## 2019-02-15 ENCOUNTER — Ambulatory Visit (INDEPENDENT_AMBULATORY_CARE_PROVIDER_SITE_OTHER): Payer: BC Managed Care – PPO | Admitting: Family Medicine

## 2019-02-15 ENCOUNTER — Encounter: Payer: Self-pay | Admitting: Family Medicine

## 2019-02-15 DIAGNOSIS — F4310 Post-traumatic stress disorder, unspecified: Secondary | ICD-10-CM | POA: Diagnosis not present

## 2019-02-15 NOTE — Assessment & Plan Note (Signed)
Has made adjustments to staying at home due to the coronavirus. Has been exercising.  - may need refill of zoloft at next appointment  - may need to perform a virtual appt for next visit.
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
## 2019-02-20 ENCOUNTER — Ambulatory Visit (INDEPENDENT_AMBULATORY_CARE_PROVIDER_SITE_OTHER): Payer: BC Managed Care – PPO | Admitting: Psychology

## 2019-02-20 DIAGNOSIS — F4310 Post-traumatic stress disorder, unspecified: Secondary | ICD-10-CM | POA: Diagnosis not present

## 2019-02-23 ENCOUNTER — Ambulatory Visit (INDEPENDENT_AMBULATORY_CARE_PROVIDER_SITE_OTHER): Payer: BC Managed Care – PPO | Admitting: Family Medicine

## 2019-02-23 ENCOUNTER — Encounter: Payer: Self-pay | Admitting: Family Medicine

## 2019-02-23 ENCOUNTER — Other Ambulatory Visit: Payer: Self-pay

## 2019-02-23 DIAGNOSIS — M542 Cervicalgia: Secondary | ICD-10-CM | POA: Diagnosis not present

## 2019-02-23 DIAGNOSIS — F419 Anxiety disorder, unspecified: Secondary | ICD-10-CM

## 2019-02-23 DIAGNOSIS — F4310 Post-traumatic stress disorder, unspecified: Secondary | ICD-10-CM
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
## 2019-03-02 ENCOUNTER — Ambulatory Visit (INDEPENDENT_AMBULATORY_CARE_PROVIDER_SITE_OTHER): Payer: BC Managed Care – PPO | Admitting: Psychology

## 2019-03-02 DIAGNOSIS — F4310 Post-traumatic stress disorder, unspecified: Secondary | ICD-10-CM

## 2019-03-12 ENCOUNTER — Ambulatory Visit (INDEPENDENT_AMBULATORY_CARE_PROVIDER_SITE_OTHER): Payer: BC Managed Care – PPO | Admitting: Psychology

## 2019-03-12 DIAGNOSIS — F4310 Post-traumatic stress disorder, unspecified: Secondary | ICD-10-CM

## 2019-03-19 ENCOUNTER — Encounter: Payer: Self-pay | Admitting: Family Medicine

## 2019-03-19 ENCOUNTER — Ambulatory Visit (INDEPENDENT_AMBULATORY_CARE_PROVIDER_SITE_OTHER): Payer: BC Managed Care – PPO | Admitting: Family Medicine

## 2019-03-19 DIAGNOSIS — F4310 Post-traumatic stress disorder, unspecified: Secondary | ICD-10-CM

## 2019-03-19 MED ORDER — SERTRALINE HCL 25 MG PO TABS: 75.0000 mg | ORAL_TABLET | Freq: Every day | ORAL | 1 refills | Status: DC

## 2019-03-19 NOTE — Assessment & Plan Note (Signed)
Is having acute grief related to the passing of her close friend.  Has an appointment with her psychologist tomorrow. -Refilled Zoloft today. -Can follow-up in 1 month.
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
## 2019-03-20 ENCOUNTER — Ambulatory Visit: Payer: BC Managed Care – PPO | Admitting: Psychology

## 2019-03-26 ENCOUNTER — Ambulatory Visit (INDEPENDENT_AMBULATORY_CARE_PROVIDER_SITE_OTHER): Payer: BC Managed Care – PPO | Admitting: Psychology

## 2019-03-26 DIAGNOSIS — F4310 Post-traumatic stress disorder, unspecified: Secondary | ICD-10-CM | POA: Diagnosis not present

## 2019-04-09 ENCOUNTER — Ambulatory Visit (INDEPENDENT_AMBULATORY_CARE_PROVIDER_SITE_OTHER): Payer: BC Managed Care – PPO | Admitting: Psychology

## 2019-04-09 DIAGNOSIS — F4310 Post-traumatic stress disorder, unspecified: Secondary | ICD-10-CM | POA: Diagnosis not present

## 2019-04-16 ENCOUNTER — Ambulatory Visit (INDEPENDENT_AMBULATORY_CARE_PROVIDER_SITE_OTHER): Payer: BC Managed Care – PPO | Admitting: Psychology

## 2019-04-16 DIAGNOSIS — F4310 Post-traumatic stress disorder, unspecified: Secondary | ICD-10-CM | POA: Diagnosis not present

## 2019-04-23 ENCOUNTER — Ambulatory Visit (INDEPENDENT_AMBULATORY_CARE_PROVIDER_SITE_OTHER): Payer: BC Managed Care – PPO | Admitting: Family Medicine

## 2019-04-23 ENCOUNTER — Encounter: Payer: Self-pay | Admitting: Family Medicine

## 2019-04-23 DIAGNOSIS — F4310 Post-traumatic stress disorder, unspecified: Secondary | ICD-10-CM | POA: Diagnosis not present
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
## 2019-04-23 NOTE — Assessment & Plan Note (Signed)
Has had trouble sleeping so has increased Zoloft again.  Headaches have been acutely worse. -May need referral to neurology or consider MRI of the brain. -Follow-up in 1 month.

## 2019-04-24 ENCOUNTER — Ambulatory Visit (INDEPENDENT_AMBULATORY_CARE_PROVIDER_SITE_OTHER): Payer: BC Managed Care – PPO | Admitting: Psychology

## 2019-04-24 DIAGNOSIS — F4310 Post-traumatic stress disorder, unspecified: Secondary | ICD-10-CM

## 2019-05-02 ENCOUNTER — Ambulatory Visit (INDEPENDENT_AMBULATORY_CARE_PROVIDER_SITE_OTHER): Payer: BC Managed Care – PPO | Admitting: Psychology

## 2019-05-02 DIAGNOSIS — F4310 Post-traumatic stress disorder, unspecified: Secondary | ICD-10-CM

## 2019-05-11 ENCOUNTER — Ambulatory Visit (INDEPENDENT_AMBULATORY_CARE_PROVIDER_SITE_OTHER): Payer: BC Managed Care – PPO | Admitting: Psychology

## 2019-05-11 DIAGNOSIS — F4310 Post-traumatic stress disorder, unspecified: Secondary | ICD-10-CM | POA: Diagnosis not present

## 2019-05-14 ENCOUNTER — Ambulatory Visit: Payer: BC Managed Care – PPO | Admitting: Psychology

## 2019-05-15 ENCOUNTER — Ambulatory Visit (INDEPENDENT_AMBULATORY_CARE_PROVIDER_SITE_OTHER): Payer: BC Managed Care – PPO | Admitting: Family Medicine

## 2019-05-15 ENCOUNTER — Encounter: Payer: Self-pay | Admitting: Family Medicine

## 2019-05-15 ENCOUNTER — Other Ambulatory Visit: Payer: Self-pay

## 2019-05-15 VITALS — BP 131/84 | HR 63 | Ht 63.0 in | Wt 202.0 lb

## 2019-05-15 DIAGNOSIS — F4310 Post-traumatic stress disorder, unspecified: Secondary | ICD-10-CM

## 2019-05-15 DIAGNOSIS — S060X0S Concussion without loss of consciousness, sequela: Secondary | ICD-10-CM
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
## 2019-05-16 NOTE — Assessment & Plan Note (Signed)
Has not been able to sleep as of late.  Feels that is associated with the upcoming mediation. -Counseled on supportive care. -Follow-up in 1 month.

## 2019-05-23 ENCOUNTER — Ambulatory Visit (INDEPENDENT_AMBULATORY_CARE_PROVIDER_SITE_OTHER): Payer: BC Managed Care – PPO | Admitting: Psychology

## 2019-05-23 DIAGNOSIS — F4310 Post-traumatic stress disorder, unspecified: Secondary | ICD-10-CM

## 2019-05-29 ENCOUNTER — Ambulatory Visit (INDEPENDENT_AMBULATORY_CARE_PROVIDER_SITE_OTHER): Payer: BC Managed Care – PPO | Admitting: Psychology

## 2019-05-29 DIAGNOSIS — F4310 Post-traumatic stress disorder, unspecified: Secondary | ICD-10-CM

## 2019-06-06 ENCOUNTER — Ambulatory Visit (INDEPENDENT_AMBULATORY_CARE_PROVIDER_SITE_OTHER): Payer: BC Managed Care – PPO | Admitting: Psychology

## 2019-06-06 DIAGNOSIS — F4310 Post-traumatic stress disorder, unspecified: Secondary | ICD-10-CM

## 2019-06-13 ENCOUNTER — Ambulatory Visit (INDEPENDENT_AMBULATORY_CARE_PROVIDER_SITE_OTHER): Payer: BC Managed Care – PPO | Admitting: Psychology

## 2019-06-13 DIAGNOSIS — F4310 Post-traumatic stress disorder, unspecified: Secondary | ICD-10-CM | POA: Diagnosis not present

## 2019-06-14 ENCOUNTER — Ambulatory Visit (INDEPENDENT_AMBULATORY_CARE_PROVIDER_SITE_OTHER): Payer: BC Managed Care – PPO | Admitting: Family Medicine

## 2019-06-14 ENCOUNTER — Other Ambulatory Visit: Payer: Self-pay

## 2019-06-14 ENCOUNTER — Encounter: Payer: Self-pay | Admitting: Family Medicine

## 2019-06-14 DIAGNOSIS — F4310 Post-traumatic stress disorder, unspecified: Secondary | ICD-10-CM | POA: Diagnosis not present

## 2019-06-14 NOTE — Assessment & Plan Note (Addendum)
Is seeing her psychologist on a weekly basis.  Has another mediation this Monday.  Taking Zoloft with some improvement of her symptoms. -Continue counseling. -Continue Zoloft. -Paperwork completed today.
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
## 2019-06-20 ENCOUNTER — Ambulatory Visit: Payer: BC Managed Care – PPO | Admitting: Psychology

## 2019-07-05 ENCOUNTER — Ambulatory Visit (INDEPENDENT_AMBULATORY_CARE_PROVIDER_SITE_OTHER): Payer: BC Managed Care – PPO | Admitting: Psychology

## 2019-07-05 DIAGNOSIS — F4310 Post-traumatic stress disorder, unspecified: Secondary | ICD-10-CM | POA: Diagnosis not present

## 2019-07-11 ENCOUNTER — Ambulatory Visit (INDEPENDENT_AMBULATORY_CARE_PROVIDER_SITE_OTHER): Payer: BC Managed Care – PPO | Admitting: Psychology

## 2019-07-11 DIAGNOSIS — F4310 Post-traumatic stress disorder, unspecified: Secondary | ICD-10-CM

## 2019-07-17 ENCOUNTER — Other Ambulatory Visit: Payer: Self-pay

## 2019-07-17 ENCOUNTER — Encounter: Payer: Self-pay | Admitting: Family Medicine

## 2019-07-17 ENCOUNTER — Ambulatory Visit (INDEPENDENT_AMBULATORY_CARE_PROVIDER_SITE_OTHER): Payer: BC Managed Care – PPO | Admitting: Family Medicine

## 2019-07-17 DIAGNOSIS — F4310 Post-traumatic stress disorder, unspecified: Secondary | ICD-10-CM

## 2019-07-17 NOTE — Assessment & Plan Note (Signed)
Waxing and waning.  Still has good follow-up with her psychologist.  Had a good birthday with some of her friends at a winery. -Counseled on supportive care. -Continue on current medication. -Follow-up in 1 month.
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
## 2019-07-24 ENCOUNTER — Ambulatory Visit (INDEPENDENT_AMBULATORY_CARE_PROVIDER_SITE_OTHER): Payer: BC Managed Care – PPO | Admitting: Family Medicine

## 2019-07-24 ENCOUNTER — Other Ambulatory Visit: Payer: Self-pay

## 2019-07-24 VITALS — BP 142/55 | HR 63 | Temp 98.8°F | Ht 63.0 in | Wt 206.8 lb

## 2019-07-24 DIAGNOSIS — F4310 Post-traumatic stress disorder, unspecified: Secondary | ICD-10-CM

## 2019-07-24 DIAGNOSIS — M542 Cervicalgia: Secondary | ICD-10-CM

## 2019-07-24 DIAGNOSIS — S161XXA Strain of muscle, fascia and tendon at neck level, initial encounter: Secondary | ICD-10-CM | POA: Insufficient documentation

## 2019-07-24 DIAGNOSIS — R8290 Unspecified abnormal findings in urine: Secondary | ICD-10-CM

## 2019-07-24 DIAGNOSIS — F419 Anxiety disorder, unspecified: Secondary | ICD-10-CM | POA: Diagnosis not present

## 2019-07-24 DIAGNOSIS — Z1211 Encounter for screening for malignant neoplasm of colon: Secondary | ICD-10-CM

## 2019-07-24 DIAGNOSIS — M47812 Spondylosis without myelopathy or radiculopathy, cervical region: Secondary | ICD-10-CM | POA: Insufficient documentation

## 2019-07-24 DIAGNOSIS — R195 Other fecal abnormalities: Secondary | ICD-10-CM | POA: Insufficient documentation

## 2019-07-24 DIAGNOSIS — Z8639 Personal history of other endocrine, nutritional and metabolic disease: Secondary | ICD-10-CM | POA: Insufficient documentation

## 2019-07-24 DIAGNOSIS — S161XXD Strain of muscle, fascia and tendon at neck level, subsequent encounter: Secondary | ICD-10-CM

## 2019-07-24 DIAGNOSIS — Z09 Encounter for follow-up examination after completed treatment for conditions other than malignant neoplasm: Secondary | ICD-10-CM

## 2019-07-24 LAB — POCT URINALYSIS DIPSTICK
Bilirubin, UA: NEGATIVE
Blood, UA: NEGATIVE
Glucose, UA: NEGATIVE
Ketones, UA: NEGATIVE
Leukocytes, UA: NEGATIVE
Nitrite, UA: NEGATIVE
Protein, UA: NEGATIVE
Spec Grav, UA: 1.02 (ref 1.010–1.025)
Urobilinogen, UA: 2 E.U./dL — AB
pH, UA: 6.5 (ref 5.0–8.0)

## 2019-07-24 NOTE — Progress Notes (Signed)
Patient [facility]   Established Patient Office Visit  Subjective:  Patient ID: Rebecca [name], female    DOB: 01-16-1956  Age: 63 y.o. MRN: [id number]  CC:  Chief Complaint  Patient presents with  . lower back pain , swelling in neck and jaw    wants thyroid level check , lower back pain     HPI Rebecca [name] is a 63 year old female who presents for Follow Up today.   Past Medical History:  Diagnosis Date  . Hyperlipidemia   . Neck pain   . Obesity   . Peritonsillar abscess 02/03/2017  . Thyroid disease    monitoring a nodule 1/9   Current Status: Since her last office visit, she has c/o lower back, neck spasm, and pelvic pain, which she states may be reason for increased pain. She continues to do [name] intermittently. Her anxiety is increased today. She continues to see Psychiatrist once weekly,  for PTSD and anxiety issues. She denies suicidal ideations, homicidal ideations, or auditory hallucinations. She has changes in bowel movements lately. Her last colonoscopy was over 10 years ago. Denies nausea, vomiting, diarrhea, and constipation.  She denies fevers, chills, fatigue, recent infections, weight loss, and night sweats. She has not had any headaches, visual changes, dizziness, and falls. No chest pain, heart palpitations, cough and shortness of breath reported.  She has no reports of blood in stools, dysuria and hematuria.  Past Surgical History:  Procedure Laterality Date  . ABDOMINAL HYSTERECTOMY      Family History  Problem Relation Age of Onset  . Cancer Mother        pancreatic cancer  . Diabetes Mother   . Heart disease Mother        rheumatic heart disease  . Hyperlipidemia Mother   . COPD Sister   . Sleep apnea Sister   . Cancer Sister        Lung Cancer  . Cancer Brother        lung cancer  . Cancer Father        renal, bone  . Diabetes Father   . Hyperlipidemia Father   . Heart attack Maternal  Grandfather   . Kidney disease Brother   . Colon cancer Maternal Aunt     Social History   Socioeconomic History  . Marital status: Single    Spouse name: N/A  . Number of children: 0  . Years of education: 18.5  . Highest education level: Not on file  Occupational History  . Occupation: Retired    Fish farm manager: Psychologist, sport and exercise    Comment: [employer] x 30 years  . 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 Financial resource strain: Not hard at all  . Food insecurity    Worry: Never true    Inability: Never true  . Transportation needs    Medical: No    Non-medical: No  Tobacco Use  . Smoking status: Never Smoker  . Smokeless tobacco: Never Used  Substance and Sexual Activity  . Alcohol use: Yes    Alcohol/week: 5.0 - 7.0 standard drinks    Types: 5 - 7 Standard drinks or equivalent per week    Comment: 4-5 * week/ 1-2 drinks  . Drug use: No  . Sexual activity: Yes    Partners: Male    Birth control/protection: Surgical  Lifestyle  . Physical activity  Days per week: 0 days    Minutes per session: 0 min  . Stress: Very much  Relationships  . Social connections    Talks on phone: More than three times a week    Gets together: More than three times a week    Attends religious service: More than 4 times per year    Active member of club or organization: Yes    Attends meetings of clubs or organizations: More than 4 times per year    Relationship status: Not on file  . Intimate partner violence    Fear of current or ex partner: No    Emotionally abused: No    Physically abused: No    Forced sexual activity: No  Other Topics Concern  . Not on file  Social History Narrative   Close friend/significant other killed (GSW) 2012.   Retired after 30 years, and took another job (commutes to [place] 4 days/week).    Outpatient Medications Prior to Visit  Medication Sig Dispense Refill  . acetaminophen  (TYLENOL) 500 MG tablet Take 1 tablet (500 mg total) by mouth every 6 (six) hours as needed. 30 tablet 0  . cholecalciferol (VITAMIN D) 1000 units tablet Take 1,000 Units by mouth daily.    Marland Kitchen ibuprofen (ADVIL,MOTRIN) 600 MG tablet Take 1 tablet (600 mg total) by mouth every 6 (six) hours as needed. 30 tablet 0  . Omega-3 Fatty Acids (FISH OIL) 1000 MG CAPS Take by mouth.    . pyridOXINE (VITAMIN B-6) 100 MG tablet Take 100 mg by mouth daily.    . sertraline (ZOLOFT) 25 MG tablet Take 3 tablets (75 mg total) by mouth daily. 90 tablet 1  . busPIRone (BUSPAR) 7.5 MG tablet Take 1 tablet (7.5 mg total) by mouth daily. (Patient not taking: Reported on 04/23/2019) 30 tablet 0  . cyclobenzaprine (FLEXERIL) 10 MG tablet Take 1 tablet (10 mg total) by mouth 3 (three) times daily as needed for muscle spasms. (Patient not taking: Reported on 04/23/2019) 30 tablet 2  . Ibuprofen-Famotidine 800-26.6 MG TABS Take 1 tablet by mouth 3 (three) times daily. (Patient not taking: Reported on 04/23/2019) 90 tablet 3  . phentermine 37.5 MG capsule Take 37.5 mg by mouth every morning.    . traZODone (DESYREL) 50 MG tablet Take 0.5-1 tablets (25-50 mg total) by mouth at bedtime as needed for sleep. (Patient not taking: Reported on 04/23/2019) 30 tablet 3   No facility-administered medications prior to visit.     Allergies  Allergen Reactions  . Prednisolone Other (See Comments)    Patient can't remember  Other reaction(s): Delusions (intolerance) Patient can't remember     ROS Review of Systems  Constitutional: Negative.   HENT: Negative.   Eyes: Negative.   Respiratory: Negative.   Cardiovascular: Negative.   Endocrine: Negative.   Genitourinary: Negative.   Musculoskeletal: Positive for neck pain.       Neck strain  Skin: Negative.   Allergic/Immunologic: Negative.   Neurological: Positive for dizziness (occasional ) and headaches (occasional).  Hematological: Negative.   Psychiatric/Behavioral:  Negative.       Objective:    Physical Exam  Constitutional: She is oriented to person, place, and time. She appears well-developed and well-nourished.  HENT:  Head: Normocephalic and atraumatic.  Eyes: Conjunctivae are normal.  Neck: Normal range of motion. Neck supple.  Cardiovascular: Normal rate, regular rhythm, normal heart sounds and intact distal pulses.  Pulmonary/Chest: Effort normal and breath sounds normal.  Abdominal: Soft.  Bowel sounds are normal. She exhibits distension (obese).  Musculoskeletal: Normal range of motion.  Neurological: She is alert and oriented to person, place, and time. She has normal reflexes.  Skin: Skin is warm and dry.  Psychiatric: She has a normal mood and affect. Her behavior is normal. Judgment and thought content normal.  Nursing note and vitals reviewed.   BP (!) 142/55 (BP Location: Left Arm, Patient Position: Sitting, Cuff Size: Large)   Pulse 63   Temp 98.8 F (37.1 C) (Oral)   Ht 5\' 3"  (1.6 m)   Wt 206 lb 12.8 oz (93.8 kg)   SpO2 94%   BMI 36.63 kg/m  Wt Readings from Last 3 Encounters:  07/24/19 206 lb 12.8 oz (93.8 kg)  07/17/19 208 lb (94.3 kg)  06/14/19 203 lb (92.1 kg)    Health Maintenance Due  Topic Date Due  . Hepatitis C Screening  [birth year]  . FOOT EXAM  07/08/1968  . OPHTHALMOLOGY EXAM  07/08/1968  . HIV Screening  07/08/1973  . URINE MICROALBUMIN  06/08/2017  . MAMMOGRAM  12/25/2018  . HEMOGLOBIN A1C  05/25/2019  . INFLUENZA VACCINE  06/09/2019    There are no preventive care reminders to display for this patient.  Lab Results  Component Value Date   TSH 1.90 06/08/2016   Lab Results  Component Value Date   WBC 8.8 02/28/2018   HGB 14.1 02/28/2018   HCT 44.7 02/28/2018   MCV 92.8 02/28/2018   PLT 245 05/09/2017   Lab Results  Component Value Date   NA 141 02/28/2018   K 4.3 02/28/2018   CO2 26 02/28/2018   GLUCOSE 87 02/28/2018   BUN 8 02/28/2018   CREATININE 0.71 02/28/2018   BILITOT 0.6  02/28/2018   ALKPHOS 94 02/28/2018   AST 20 02/28/2018   ALT 13 02/28/2018   PROT 7.4 02/28/2018   ALBUMIN 4.4 02/28/2018   CALCIUM 9.5 02/28/2018   Lab Results  Component Value Date   CHOL 277 (H) 05/09/2017   Lab Results  Component Value Date   HDL 72 05/09/2017   Lab Results  Component Value Date   LDLCALC 177 (H) 05/09/2017   Lab Results  Component Value Date   TRIG 139 05/09/2017   Lab Results  Component Value Date   CHOLHDL 3.8 05/09/2017   Lab Results  Component Value Date   HGBA1C 5.2 11/24/2018      Assessment & Plan:   1. PTSD (post-traumatic stress disorder)  2. Anxiety Stable. Continue to follow up with Psychiatrist today.   3. Neck pain She will continue OTC pain medications as directed.   4. Muscle strain of neck She will practice RICE methods to aide in pain management of neck pain and discomfort.   5. History of thyroid nodule We will assess Thyroid levels today.  - Thyroid Panel With TSH  5. Change in stool  6. Screening for colon cancer - Ambulatory referral to Gastroenterology  7. Abnormal urinalysis Results are pending.  - Urine Culture - POCT Urinalysis Dipstick  8. Follow up She will follow up in 3 months.   No orders of the defined types were placed in this encounter.   Orders Placed This Encounter  Procedures  . Urine Culture  . Thyroid Panel With TSH  . Ambulatory referral to Gastroenterology  . POCT Urinalysis Dipstick     Referral Orders     Ambulatory referral to Gastroenterology   Kathe Becton,  MSN, FNP-BC [facility] [place], [place] [address] [phone] fax   Problem List Items Addressed This Visit      Other   Neck pain   PTSD (post-traumatic stress disorder) - Primary    Other Visit Diagnoses    Anxiety       History of thyroid nodule       Relevant Orders   Thyroid Panel With TSH    Screening for colon cancer       Relevant Orders   Ambulatory referral to Gastroenterology   Abnormal urinalysis       Relevant Orders   Urine Culture   POCT Urinalysis Dipstick (Completed)   Follow up          No orders of the defined types were placed in this encounter.   Follow-up: Return in about 3 months (around 10/23/2019).    Azzie Glatter, FNP

## 2019-07-24 NOTE — Patient Instructions (Addendum)
Exercising to Lose Weight Exercise is structured, repetitive physical activity to improve fitness and health. Getting regular exercise is important for everyone. It is especially important if you are overweight. Being overweight increases your risk of heart disease, stroke, diabetes, high blood pressure, and several types of cancer. Reducing your calorie intake and exercising can help you lose weight. Exercise is usually categorized as moderate or vigorous intensity. To lose weight, most people need to do a certain amount of moderate-intensity or vigorous-intensity exercise each week. Moderate-intensity exercise  Moderate-intensity exercise is any activity that gets you moving enough to burn at least three times more energy (calories) than if you were sitting. Examples of moderate exercise include:  Walking a mile in 15 minutes.  Doing light yard work.  Biking at an easy pace. Most people should get at least 150 minutes (2 hours and 30 minutes) a week of moderate-intensity exercise to maintain their body weight. Vigorous-intensity exercise Vigorous-intensity exercise is any activity that gets you moving enough to burn at least six times more calories than if you were sitting. When you exercise at this intensity, you should be working hard enough that you are not able to carry on a conversation. Examples of vigorous exercise include:  Running.  Playing a team sport, such as football, basketball, and soccer.  Jumping rope. Most people should get at least 75 minutes (1 hour and 15 minutes) a week of vigorous-intensity exercise to maintain their body weight. How can exercise affect me? When you exercise enough to burn more calories than you eat, you lose weight. Exercise also reduces body fat and builds muscle. The more muscle you have, the more calories you burn. Exercise also:  Improves mood.  Reduces stress and tension.  Improves your overall fitness, flexibility, and endurance.   Increases bone strength. The amount of exercise you need to lose weight depends on:  Your age.  The type of exercise.  Any health conditions you have.  Your overall physical ability. Talk to your health care provider about how much exercise you need and what types of activities are safe for you. What actions can I take to lose weight? Nutrition   Make changes to your diet as told by your health care provider or diet and nutrition specialist (dietitian). This may include: ? Eating fewer calories. ? Eating more protein. ? Eating less unhealthy fats. ? Eating a diet that includes fresh fruits and vegetables, whole grains, low-fat dairy products, and lean protein. ? Avoiding foods with added fat, salt, and sugar.  Drink plenty of water while you exercise to prevent dehydration or heat stroke. Activity  Choose an activity that you enjoy and set realistic goals. Your health care provider can help you make an exercise plan that works for you.  Exercise at a moderate or vigorous intensity most days of the week. ? The intensity of exercise may vary from person to person. You can tell how intense a workout is for you by paying attention to your breathing and heartbeat. Most people will notice their breathing and heartbeat get faster with more intense exercise.  Do resistance training twice each week, such as: ? Push-ups. ? Sit-ups. ? Lifting weights. ? Using resistance bands.  Getting short amounts of exercise can be just as helpful as long structured periods of exercise. If you have trouble finding time to exercise, try to include exercise in your daily routine. ? Get up, stretch, and walk around every 30 minutes throughout the day. ? Go for a   walk during your lunch break. ? Park your car farther away from your destination. ? If you take public transportation, get off one stop early and walk the rest of the way. ? Make phone calls while standing up and walking around. ? Take the  stairs instead of elevators or escalators.  Wear comfortable clothes and shoes with good support.  Do not exercise so much that you hurt yourself, feel dizzy, or get very short of breath. Where to find more information  U.S. Department of Health and Human Services: [employer].at  Centers for Disease Control and Prevention (CDC): http://www.wolf.info/ Contact a health care provider:  Before starting a new exercise program.  If you have questions or concerns about your weight.  If you have a medical problem that keeps you from exercising. Get help right away if you have any of the following while exercising:  Injury.  Dizziness.  Difficulty breathing or shortness of breath that does not go away when you stop exercising.  Chest pain.  Rapid heartbeat. Summary  Being overweight increases your risk of heart disease, stroke, diabetes, high blood pressure, and several types of cancer.  Losing weight happens when you burn more calories than you eat.  Reducing the amount of calories you eat in addition to getting regular moderate or vigorous exercise each week helps you lose weight. This information is not intended to replace advice given to you by your health care provider. Make sure you discuss any questions you have with your health care provider. Document Released: 11/27/2010 Document Revised: 11/07/2017 Document Reviewed: 11/07/2017 Elsevier Patient Education  2020 [employer].  Muscle Strain A muscle strain is an injury that happens when a muscle is stretched longer than normal. This can happen during a fall, sports, or lifting. This can tear some muscle fibers. Usually, recovery from muscle strain takes 1-2 weeks. Complete healing normally takes 5-6 weeks. This condition is first treated with PRICE therapy. This involves:  Protecting your muscle from being injured again.  Resting your injured muscle.  Icing your injured muscle.  Applying pressure (compression) to your injured  muscle. This may be done with a splint or elastic bandage.  Raising (elevating) your injured muscle. Your doctor may also recommend medicine for pain. Follow these instructions at home: If you have a splint:  Wear the splint as told by your doctor. Take it off only as told by your doctor.  Loosen the splint if your fingers or toes tingle, get numb, or turn cold and blue.  Keep the splint clean.  If the splint is not waterproof: ? Do not let it get wet. ? Cover it with a watertight covering when you take a bath or a shower. Managing pain, stiffness, and swelling   If directed, put ice on your injured area. ? If you have a removable splint, take it off as told by your doctor. ? Put ice in a plastic bag. ? Place a towel between your skin and the bag. ? Leave the ice on for 20 minutes, 2-3 times a day.  Move your fingers or toes often. This helps to avoid stiffness and lessen swelling.  Raise your injured area above the level of your heart while you are sitting or lying down.  Wear an elastic bandage as told by your doctor. Make sure it is not too tight. General instructions  Take over-the-counter and prescription medicines only as told by your doctor.  Limit your activity. Rest your injured muscle as told by your doctor. Your  doctor may say that gentle movements are okay.  If physical therapy was prescribed, do exercises as told by your doctor.  Do not put pressure on any part of the splint until it is fully hardened. This may take many hours.  Do not use any products that contain nicotine or tobacco, such as cigarettes and e-cigarettes. These can delay bone healing. If you need help quitting, ask your doctor.  Warm up before you exercise. This helps to prevent more muscle strains.  Ask your doctor when it is safe to drive if you have a splint.  Keep all follow-up visits as told by your doctor. This is important. Contact a doctor if:  You have more pain or swelling in  your injured area. Get help right away if:  You have any of these problems in your injured area: ? You have numbness. ? You have tingling. ? You lose a lot of strength. Summary  A muscle strain is an injury that happens when a muscle is stretched longer than normal.  This condition is first treated with PRICE therapy. This includes protecting, resting, icing, adding pressure, and raising your injury.  Limit your activity. Rest your injured muscle as told by your doctor. Your doctor may say that gentle movements are okay.  Warm up before you exercise. This helps to prevent more muscle strains. This information is not intended to replace advice given to you by your health care provider. Make sure you discuss any questions you have with your health care provider. Document Released: 08/03/2008 Document Revised: 12/21/2018 Document Reviewed: 12/01/2016 Elsevier Patient Education  2020 [employer].

## 2019-07-25 LAB — THYROID PANEL WITH TSH
Free Thyroxine Index: 1.7 (ref 1.2–4.9)
T3 Uptake Ratio: 26 % (ref 24–39)
T4, Total: 6.4 ug/dL (ref 4.5–12.0)
TSH: 1.38 u[IU]/mL (ref 0.450–4.500)

## 2019-07-26 LAB — URINE CULTURE

## 2019-08-06 ENCOUNTER — Telehealth: Payer: Self-pay | Admitting: Family Medicine

## 2019-08-07 NOTE — Telephone Encounter (Signed)
Spoke with patient. Will provide letter.   Rosemarie Ax, MD [facility] 08/07/2019, 3:06 PM

## 2019-08-08 ENCOUNTER — Ambulatory Visit (INDEPENDENT_AMBULATORY_CARE_PROVIDER_SITE_OTHER): Payer: BC Managed Care – PPO | Admitting: Psychology

## 2019-08-08 DIAGNOSIS — F4310 Post-traumatic stress disorder, unspecified: Secondary | ICD-10-CM | POA: Diagnosis not present

## 2019-08-14 ENCOUNTER — Ambulatory Visit (INDEPENDENT_AMBULATORY_CARE_PROVIDER_SITE_OTHER): Payer: BC Managed Care – PPO | Admitting: Psychology

## 2019-08-14 DIAGNOSIS — F4310 Post-traumatic stress disorder, unspecified: Secondary | ICD-10-CM

## 2019-08-16 ENCOUNTER — Ambulatory Visit (INDEPENDENT_AMBULATORY_CARE_PROVIDER_SITE_OTHER): Payer: BC Managed Care – PPO | Admitting: Family Medicine

## 2019-08-16 ENCOUNTER — Other Ambulatory Visit: Payer: Self-pay

## 2019-08-16 DIAGNOSIS — F4310 Post-traumatic stress disorder, unspecified: Secondary | ICD-10-CM | POA: Diagnosis not present

## 2019-08-16 MED ORDER — SERTRALINE HCL 25 MG PO TABS: 75.0000 mg | ORAL_TABLET | Freq: Every day | ORAL | 1 refills | Status: DC

## 2019-08-16 NOTE — Progress Notes (Signed)
Rebecca [name] - 63 y.o. female MRN [id number]  Date of birth: 09-16-1956  SUBJECTIVE:  Including CC & ROS.  Chief Complaint  Patient presents with  . Follow-up    follow up for concussion    Rebecca [name] is a 63 y.o. female that is  following up for her PTSD and postconcussive symptoms.  She reports having myalgias in different areas.  She is having some upper back and neck pain.  Her quality of sleep has been lacking.  She can stay in bed most all day on some days.  She feels nauseous and has reflux.  She has met with a gastroenterologist and they have a EGD as well as a colonoscopy scheduled.  She has been taking the Zoloft on a more regular basis.  She has not been exercising like she normally does.   Review of Systems  Constitutional: Negative for fever.  HENT: Negative for congestion.   Respiratory: Negative for cough.   Cardiovascular: Negative for chest pain.  Gastrointestinal: Negative for abdominal pain.  Musculoskeletal: Positive for myalgias and neck pain.  Skin: Negative for color change.  Neurological: Positive for headaches.  Hematological: Negative for adenopathy.    HISTORY: Past Medical, Surgical, Social, and Family History Reviewed & Updated per EMR.   Pertinent Historical Findings include:  Past Medical History:  Diagnosis Date  . Hyperlipidemia   . Neck pain   . Obesity   . Peritonsillar abscess 02/03/2017  . Thyroid disease    monitoring a nodule 1/9    Past Surgical History:  Procedure Laterality Date  . ABDOMINAL HYSTERECTOMY      Allergies  Allergen Reactions  . Prednisolone Other (See Comments)    Patient can't remember  Other reaction(s): Delusions (intolerance) Patient can't remember     Family History  Problem Relation Age of Onset  . Cancer Mother        pancreatic cancer  . Diabetes Mother   . Heart disease Mother        rheumatic heart disease  . Hyperlipidemia Mother   . COPD Sister   . Sleep apnea Sister   . Cancer  Sister        Lung Cancer  . Cancer Brother        lung cancer  . Cancer Father        renal, bone  . Diabetes Father   . Hyperlipidemia Father   . Heart attack Maternal Grandfather   . Kidney disease Brother   . Colon cancer Maternal Aunt      Social History   Socioeconomic History  . Marital status: Single    Spouse name: N/A  . Number of children: 0  . Years of education: 18.5  . Highest education level: Not on file  Occupational History  . Occupation: Retired    Fish farm manager: Psychologist, sport and exercise    Comment: [employer] x 30 years  . 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 Financial resource strain: Not hard at all  . Food insecurity    Worry: Never true    Inability: Never true  . Transportation needs    Medical: No    Non-medical: No  Tobacco Use  . Smoking status: Never Smoker  . Smokeless tobacco: Never Used  Substance and Sexual Activity  . Alcohol use: Yes    Alcohol/week: 5.0 - 7.0 standard drinks    Types: 5 - 7 Standard drinks or equivalent per week  Comment: 4-5 * week/ 1-2 drinks  . Drug use: No  . Sexual activity: Yes    Partners: Male    Birth control/protection: Surgical  Lifestyle  . Physical activity    Days per week: 0 days    Minutes per session: 0 min  . Stress: Very much  Relationships  . Social connections    Talks on phone: More than three times a week    Gets together: More than three times a week    Attends religious service: More than 4 times per year    Active member of club or organization: Yes    Attends meetings of clubs or organizations: More than 4 times per year    Relationship status: Not on file  . Intimate partner violence    Fear of current or ex partner: No    Emotionally abused: No    Physically abused: No    Forced sexual activity: No  Other Topics Concern  . Not on file  Social History Narrative   Close friend/significant other killed (GSW) 2012.    Retired after 30 years, and took another job (commutes to [place] 4 days/week).     PHYSICAL EXAM:  VS: BP (!) 155/73   Pulse 64   Ht _0  (1.6 m)   Wt 209 lb (94.8 kg)   BMI 37.02 kg/m  Physical Exam Gen: NAD, alert, cooperative with exam, well-appearing ENT: normal lips, normal nasal mucosa,  Eye: normal EOM, normal conjunctiva and lids CV:  no edema, +2 pedal pulses   Resp: no accessory muscle use, non-labored,   Skin: no rashes, no areas of induration  Neuro: normal tone, normal sensation to touch Psych:  normal insight, alert and oriented MSK:  Normal neck ROM  Normal gait NVI       ASSESSMENT & PLAN:   PTSD (post-traumatic stress disorder) Appears to be having an acute exacerbation. Has lack of energy and focus. Feels like staying in the bed some days.  - refilled zoloft  - counseled about writing down techniques  - consider referral to PT.

## 2019-08-16 NOTE — Assessment & Plan Note (Signed)
Appears to be having an acute exacerbation. Has lack of energy and focus. Feels like staying in the bed some days.  - refilled zoloft  - counseled about writing down techniques  - consider referral to PT.

## 2019-08-16 NOTE — Patient Instructions (Signed)
Good to see you Please let me know if you want to try physical therapy  Please try using the techniques we discussed   Please send me a message in MyChart with any questions or updates.  Please see me back in 4 weeks.   --Dr. Raeford Razor

## 2019-08-24 ENCOUNTER — Ambulatory Visit (INDEPENDENT_AMBULATORY_CARE_PROVIDER_SITE_OTHER): Payer: BC Managed Care – PPO | Admitting: Psychology

## 2019-08-24 DIAGNOSIS — F4310 Post-traumatic stress disorder, unspecified: Secondary | ICD-10-CM | POA: Diagnosis not present

## 2019-08-28 ENCOUNTER — Ambulatory Visit: Payer: Self-pay | Admitting: Family Medicine

## 2019-08-29 ENCOUNTER — Ambulatory Visit (INDEPENDENT_AMBULATORY_CARE_PROVIDER_SITE_OTHER): Payer: BC Managed Care – PPO | Admitting: Psychology

## 2019-08-29 DIAGNOSIS — F4310 Post-traumatic stress disorder, unspecified: Secondary | ICD-10-CM | POA: Diagnosis not present

## 2019-08-30 ENCOUNTER — Ambulatory Visit: Payer: BC Managed Care – PPO | Admitting: Psychology

## 2019-09-03 ENCOUNTER — Ambulatory Visit (INDEPENDENT_AMBULATORY_CARE_PROVIDER_SITE_OTHER): Payer: BC Managed Care – PPO | Admitting: Psychology

## 2019-09-03 DIAGNOSIS — F4310 Post-traumatic stress disorder, unspecified: Secondary | ICD-10-CM | POA: Diagnosis not present

## 2019-09-10 ENCOUNTER — Ambulatory Visit (INDEPENDENT_AMBULATORY_CARE_PROVIDER_SITE_OTHER): Payer: BC Managed Care – PPO | Admitting: Psychology

## 2019-09-10 DIAGNOSIS — F4310 Post-traumatic stress disorder, unspecified: Secondary | ICD-10-CM

## 2019-09-13 ENCOUNTER — Ambulatory Visit (INDEPENDENT_AMBULATORY_CARE_PROVIDER_SITE_OTHER): Payer: BC Managed Care – PPO | Admitting: Family Medicine

## 2019-09-13 ENCOUNTER — Other Ambulatory Visit: Payer: Self-pay

## 2019-09-13 ENCOUNTER — Encounter: Payer: Self-pay | Admitting: Family Medicine

## 2019-09-13 DIAGNOSIS — F4310 Post-traumatic stress disorder, unspecified: Secondary | ICD-10-CM | POA: Diagnosis not present

## 2019-09-13 NOTE — Progress Notes (Signed)
Rebecca [name] - 63 y.o. female MRN [id number]  Date of birth: Jun 14, 1956  SUBJECTIVE:  Including CC & ROS.  Chief Complaint  Patient presents with  . Follow-up    follow up visit    Rebecca [name] is a 63 y.o. female that is following up for her PTSD and postconcussive symptoms.  She takes the Zoloft intermittently when she is feeling more anxious.  Her PTSD symptoms are under control currently.  She is still walking as well as talking to the psychologist on a regular basis.  Her short-term disability was approved so that is a relief for her.    Review of Systems  Constitutional: Negative for fever.  HENT: Negative for congestion.   Respiratory: Negative for cough.   Cardiovascular: Negative for chest pain.  Gastrointestinal: Negative for abdominal pain.  Musculoskeletal: Positive for myalgias.  Skin: Negative for color change.  Neurological: Positive for headaches.    HISTORY: Past Medical, Surgical, Social, and Family History Reviewed & Updated per EMR.   Pertinent Historical Findings include:  Past Medical History:  Diagnosis Date  . Hyperlipidemia   . Neck pain   . Obesity   . Peritonsillar abscess 02/03/2017  . Thyroid disease    monitoring a nodule 1/9    Past Surgical History:  Procedure Laterality Date  . ABDOMINAL HYSTERECTOMY      Allergies  Allergen Reactions  . Prednisolone Other (See Comments)    Patient can't remember  Other reaction(s): Delusions (intolerance) Patient can't remember     Family History  Problem Relation Age of Onset  . Cancer Mother        pancreatic cancer  . Diabetes Mother   . Heart disease Mother        rheumatic heart disease  . Hyperlipidemia Mother   . COPD Sister   . Sleep apnea Sister   . Cancer Sister        Lung Cancer  . Cancer Brother        lung cancer  . Cancer Father        renal, bone  . Diabetes Father   . Hyperlipidemia Father   . Heart attack Maternal Grandfather   . Kidney disease Brother   .  Colon cancer Maternal Aunt      Social History   Socioeconomic History  . Marital status: Single    Spouse name: N/A  . Number of children: 0  . Years of education: 18.5  . Highest education level: Not on file  Occupational History  . Occupation: Retired    Fish farm manager: Psychologist, sport and exercise    Comment: [employer] x 30 years  . 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 Financial resource strain: Not hard at all  . Food insecurity    Worry: Never true    Inability: Never true  . Transportation needs    Medical: No    Non-medical: No  Tobacco Use  . Smoking status: Never Smoker  . Smokeless tobacco: Never Used  Substance and Sexual Activity  . Alcohol use: Yes    Alcohol/week: 5.0 - 7.0 standard drinks    Types: 5 - 7 Standard drinks or equivalent per week    Comment: 4-5 * week/ 1-2 drinks  . Drug use: No  . Sexual activity: Yes    Partners: Male    Birth control/protection: Surgical  Lifestyle  . Physical activity    Days per week: 0 days  Minutes per session: 0 min  . Stress: Very much  Relationships  . Social connections    Talks on phone: More than three times a week    Gets together: More than three times a week    Attends religious service: More than 4 times per year    Active member of club or organization: Yes    Attends meetings of clubs or organizations: More than 4 times per year    Relationship status: Not on file  . Intimate partner violence    Fear of current or ex partner: No    Emotionally abused: No    Physically abused: No    Forced sexual activity: No  Other Topics Concern  . Not on file  Social History Narrative   Close friend/significant other killed (GSW) 2012.   Retired after 30 years, and took another job (commutes to [place] 4 days/week).     PHYSICAL EXAM:  VS: BP 120/76   Pulse 75   Ht 5\' 3"  (1.6 m)   Wt 205 lb (93 kg)   BMI 36.31 kg/m  Physical Exam Gen: NAD,  alert, cooperative with exam, well-appearing ENT: normal lips, normal nasal mucosa,  Eye: normal EOM, normal conjunctiva and lids CV:  no edema, +2 pedal pulses   Resp: no accessory muscle use, non-labored,   Skin: no rashes, no areas of induration  Neuro: normal tone, normal sensation to touch Psych:  normal insight, alert and oriented MSK: Normal gait, normal strength     ASSESSMENT & PLAN:   PTSD (post-traumatic stress disorder) Symptoms seem to be controlled as of late.  Taking Zoloft as needed.  Short-term disability was approved for another year so that is relief. -Follow-up in 1 month.

## 2019-09-13 NOTE — Assessment & Plan Note (Signed)
Symptoms seem to be controlled as of late.  Taking Zoloft as needed.  Short-term disability was approved for another year so that is relief. -Follow-up in 1 month.

## 2019-09-25 ENCOUNTER — Ambulatory Visit (INDEPENDENT_AMBULATORY_CARE_PROVIDER_SITE_OTHER): Payer: BC Managed Care – PPO | Admitting: Psychology

## 2019-09-25 DIAGNOSIS — F4310 Post-traumatic stress disorder, unspecified: Secondary | ICD-10-CM

## 2019-10-01 ENCOUNTER — Ambulatory Visit (INDEPENDENT_AMBULATORY_CARE_PROVIDER_SITE_OTHER): Payer: BC Managed Care – PPO | Admitting: Psychology

## 2019-10-01 DIAGNOSIS — F4310 Post-traumatic stress disorder, unspecified: Secondary | ICD-10-CM | POA: Diagnosis not present

## 2019-10-11 ENCOUNTER — Encounter: Payer: Self-pay | Admitting: Family Medicine

## 2019-10-11 ENCOUNTER — Ambulatory Visit (INDEPENDENT_AMBULATORY_CARE_PROVIDER_SITE_OTHER): Payer: BC Managed Care – PPO | Admitting: Family Medicine

## 2019-10-11 ENCOUNTER — Other Ambulatory Visit: Payer: Self-pay

## 2019-10-11 DIAGNOSIS — F4310 Post-traumatic stress disorder, unspecified: Secondary | ICD-10-CM

## 2019-10-11 NOTE — Progress Notes (Signed)
Mariadelaluz Pavlicek - 63 y.o. female MRN [id number]  Date of birth: [birth year]  SUBJECTIVE:  Including CC & ROS.  Chief Complaint  Patient presents with  . Follow-up    Elayjah Vigneault is a 63 y.o. female that is following up for her PTSD.  She admits to not sleeping very well.  This is been ongoing for some time now.  She has started walking again.  She has walked in the mornings past couple of days.  She feels better when she does walk.  She is also getting supplies for her family numbers that are in different cities.  Different for numerous have recently diagnosed with Covid.  She is still seeing her psychologist.    Review of Systems  Constitutional: Negative for fever.  HENT: Negative for congestion.   Respiratory: Negative for cough.   Cardiovascular: Negative for chest pain.  Gastrointestinal: Negative for abdominal pain.  Musculoskeletal: Negative for joint swelling.  Skin: Negative for color change.  Neurological: Positive for headaches.  Hematological: Negative for adenopathy.  Psychiatric/Behavioral: Negative for agitation.    HISTORY: Past Medical, Surgical, Social, and Family History Reviewed & Updated per EMR.   Pertinent Historical Findings include:  Past Medical History:  Diagnosis Date  . Hyperlipidemia   . Neck pain   . Obesity   . Peritonsillar abscess 02/03/2017  . Thyroid disease    monitoring a nodule 1/9    Past Surgical History:  Procedure Laterality Date  . ABDOMINAL HYSTERECTOMY      Allergies  Allergen Reactions  . Prednisolone Other (See Comments)    Patient can't remember  Other reaction(s): Delusions (intolerance) Patient can't remember     Family History  Problem Relation Age of Onset  . Cancer Mother        pancreatic cancer  . Diabetes Mother   . Heart disease Mother        rheumatic heart disease  . Hyperlipidemia Mother   . COPD Sister   . Sleep apnea Sister   . Cancer Sister        Lung Cancer  . Cancer Brother    lung cancer  . Cancer Father        renal, bone  . Diabetes Father   . Hyperlipidemia Father   . Heart attack Maternal Grandfather   . Kidney disease Brother   . Colon cancer Maternal Aunt      Social History   Socioeconomic History  . Marital status: Single    Spouse name: N/A  . Number of children: 0  . Years of education: 18.5  . Highest education level: Not on file  Occupational History  . Occupation: Retired    Fish farm manager: Psychologist, sport and exercise    Comment: [employer] x 30 years  . 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 Financial resource strain: Not hard at all  . Food insecurity    Worry: Never true    Inability: Never true  . Transportation needs    Medical: No    Non-medical: No  Tobacco Use  . Smoking status: Never Smoker  . Smokeless tobacco: Never Used  Substance and Sexual Activity  . Alcohol use: Yes    Alcohol/week: 5.0 - 7.0 standard drinks    Types: 5 - 7 Standard drinks or equivalent per week    Comment: 4-5 * week/ 1-2 drinks  . Drug use: No  . Sexual activity: Yes    Partners: Male  Birth control/protection: Surgical  Lifestyle  . Physical activity    Days per week: 0 days    Minutes per session: 0 min  . Stress: Very much  Relationships  . Social connections    Talks on phone: More than three times a week    Gets together: More than three times a week    Attends religious service: More than 4 times per year    Active member of club or organization: Yes    Attends meetings of clubs or organizations: More than 4 times per year    Relationship status: Not on file  . Intimate partner violence    Fear of current or ex partner: No    Emotionally abused: No    Physically abused: No    Forced sexual activity: No  Other Topics Concern  . Not on file  Social History Narrative   Close friend/significant other killed (GSW) 2012.   Retired after 30 years, and took another job  (commutes to [place] 4 days/week).     PHYSICAL EXAM:  VS: BP (!) 152/84   Pulse 86   Ht 5\' 3"  (1.6 m)   Wt 209 lb (94.8 kg)   BMI 37.02 kg/m  Physical Exam Gen: NAD, alert, cooperative with exam, well-appearing ENT: normal lips, normal nasal mucosa,  Eye: normal EOM, normal conjunctiva and lids CV:  no edema, +2 pedal pulses   Resp: no accessory muscle use, non-labored,   Skin: no rashes, no areas of induration  Neuro: normal tone, normal sensation to touch Psych:  normal insight, alert and oriented MSK: normal gait, normal strength       ASSESSMENT & PLAN:   PTSD (post-traumatic stress disorder) Has started walking again. Trouble sleeping.  - counseled on supportive care - counseled on HEP  - f/u in one month.

## 2019-10-11 NOTE — Assessment & Plan Note (Signed)
Has started walking again. Trouble sleeping.  - counseled on supportive care - counseled on HEP  - f/u in one month.

## 2019-10-16 ENCOUNTER — Ambulatory Visit (INDEPENDENT_AMBULATORY_CARE_PROVIDER_SITE_OTHER): Payer: BC Managed Care – PPO | Admitting: Psychology

## 2019-10-16 DIAGNOSIS — F4310 Post-traumatic stress disorder, unspecified: Secondary | ICD-10-CM

## 2019-10-23 ENCOUNTER — Ambulatory Visit (INDEPENDENT_AMBULATORY_CARE_PROVIDER_SITE_OTHER): Payer: BC Managed Care – PPO | Admitting: Family Medicine

## 2019-10-23 ENCOUNTER — Encounter: Payer: Self-pay | Admitting: Family Medicine

## 2019-10-23 ENCOUNTER — Other Ambulatory Visit: Payer: Self-pay

## 2019-10-23 VITALS — BP 143/77 | HR 75 | Temp 97.8°F | Ht 63.0 in | Wt 218.0 lb

## 2019-10-23 DIAGNOSIS — F4310 Post-traumatic stress disorder, unspecified: Secondary | ICD-10-CM

## 2019-10-23 DIAGNOSIS — R8290 Unspecified abnormal findings in urine: Secondary | ICD-10-CM

## 2019-10-23 DIAGNOSIS — I10 Essential (primary) hypertension: Secondary | ICD-10-CM | POA: Insufficient documentation

## 2019-10-23 DIAGNOSIS — F419 Anxiety disorder, unspecified: Secondary | ICD-10-CM

## 2019-10-23 DIAGNOSIS — Z09 Encounter for follow-up examination after completed treatment for conditions other than malignant neoplasm: Secondary | ICD-10-CM

## 2019-10-23 DIAGNOSIS — Z0000 Encounter for general adult medical examination without abnormal findings: Secondary | ICD-10-CM | POA: Diagnosis not present

## 2019-10-23 DIAGNOSIS — M542 Cervicalgia: Secondary | ICD-10-CM

## 2019-10-23 LAB — POCT URINALYSIS DIPSTICK
Bilirubin, UA: NEGATIVE
Glucose, UA: NEGATIVE
Ketones, UA: NEGATIVE
Leukocytes, UA: NEGATIVE
Nitrite, UA: NEGATIVE
Protein, UA: POSITIVE — AB
Spec Grav, UA: >=1.03 — AB (ref 1.010–1.025)
Urobilinogen, UA: 0.2 E.U./dL
pH, UA: 5 (ref 5.0–8.0)

## 2019-10-23 LAB — POCT GLYCOSYLATED HEMOGLOBIN (HGB A1C): Hemoglobin A1C: 5.3 % (ref 4.0–5.6)

## 2019-10-23 LAB — GLUCOSE, POCT (MANUAL RESULT ENTRY): POC Glucose: 94 mg/dl (ref 70–99)

## 2019-10-23 NOTE — Progress Notes (Signed)
Patient [facility]   Established Patient Office Visit  Subjective:  Patient ID: Rebecca [name], female    DOB: [birth year]  Age: 63 y.o. MRN: [id number]  CC:  Chief Complaint  Patient presents with  . Follow-up    HTN    HPI Rebecca [name] is a 63 year old female who presents for Follow Up today.   Past Medical History:  Diagnosis Date  . Hyperlipidemia   . Neck pain   . Obesity   . Peritonsillar abscess 02/03/2017  . Thyroid disease    monitoring a nodule 1/9   Current Status: Since her last office visit, she is doing well with no complaints. She states that her anxiety is increased as this time. She denies suicidal ideations, homicidal ideations, or auditory hallucinations.She continues therapy session weekly, and Psychiatry monthly for PTSD and early retirement issues. She has recently been approved for short term disability. Her sleep is unstable. She has discontinue Lexapro for months now. She recently had Colonoscopy, which revealed polyps, to follow up in 7 years. Denies nausea, vomiting, diarrhea, and constipation. She has no reports of blood in stools, dysuria and hematuria. She denies fevers, chills, fatigue, recent infections, weight loss, and night sweats. She has not had any headaches, visual changes, dizziness, and falls. No chest pain, heart palpitations, cough and shortness of breath reported. She denies pain today.   Past Surgical History:  Procedure Laterality Date  . ABDOMINAL HYSTERECTOMY      Family History  Problem Relation Age of Onset  . Cancer Mother        pancreatic cancer  . Diabetes Mother   . Heart disease Mother        rheumatic heart disease  . Hyperlipidemia Mother   . COPD Sister   . Sleep apnea Sister   . Cancer Sister        Lung Cancer  . Cancer Brother        lung cancer  . Cancer Father        renal, bone  . Diabetes Father   . Hyperlipidemia Father   . Heart attack Maternal  Grandfather   . Kidney disease Brother   . Colon cancer Maternal Aunt     Social History   Socioeconomic History  . Marital status: Single    Spouse name: N/A  . Number of children: 0  . Years of education: 18.5  . Highest education level: Not on file  Occupational History  . Occupation: Retired    Fish farm manager: Psychologist, sport and exercise    Comment: [employer] x 30 years  . 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 Smoking status: Never Smoker  . Smokeless tobacco: Never Used  Substance and Sexual Activity  . Alcohol use: Yes    Alcohol/week: 5.0 - 7.0 standard drinks    Types: 5 - 7 Standard drinks or equivalent per week    Comment: 4-5 * week/ 1-2 drinks  . Drug use: No  . Sexual activity: Yes    Partners: Male    Birth control/protection: Surgical  Other Topics Concern  . Not on file  Social History Narrative   Close friend/significant other killed (GSW) 2012.   Retired after 30 years, and took another job (commutes to [place] 4 days/week).   Social Determinants of Health   Financial Resource Strain:   . Difficulty of Paying Living Expenses: Not on file  Food Insecurity:   . Worried About Charity fundraiser in the Last Year: Not on file  . Ran Out of Food in the Last Year: Not on file  Transportation Needs:   . Lack of Transportation (Medical): Not on file  . Lack of Transportation (Non-Medical): Not on file  Physical Activity:   . Days of Exercise per Week: Not on file  . Minutes of Exercise per Session: Not on file  Stress:   . Feeling of Stress : Not on file  Social Connections:   . Frequency of Communication with Friends and Family: Not on file  . Frequency of Social Gatherings with Friends and Family: Not on file  . Attends Religious Services: Not on file  . Active Member of Clubs or Organizations: Not on file  . Attends Archivist Meetings: Not on file  . Marital Status: Not on file    Intimate Partner Violence:   . Fear of Current or Ex-Partner: Not on file  . Emotionally Abused: Not on file  . Physically Abused: Not on file  . Sexually Abused: Not on file    Outpatient Medications Prior to Visit  Medication Sig Dispense Refill  . acetaminophen (TYLENOL) 500 MG tablet Take 1 tablet (500 mg total) by mouth every 6 (six) hours as needed. 30 tablet 0  . ibuprofen (ADVIL,MOTRIN) 600 MG tablet Take 1 tablet (600 mg total) by mouth every 6 (six) hours as needed. 30 tablet 0  . pyridOXINE (VITAMIN B-6) 100 MG tablet Take 100 mg by mouth daily.    . sertraline (ZOLOFT) 25 MG tablet Take 3 tablets (75 mg total) by mouth daily. 90 tablet 1  . cholecalciferol (VITAMIN D) 1000 units tablet Take 1,000 Units by mouth daily.    . cyclobenzaprine (FLEXERIL) 10 MG tablet Take 1 tablet (10 mg total) by mouth 3 (three) times daily as needed for muscle spasms. (Patient not taking: Reported on 04/23/2019) 30 tablet 2  . Omega-3 Fatty Acids (FISH OIL) 1000 MG CAPS Take by mouth.    . phentermine 37.5 MG capsule Take 37.5 mg by mouth every morning.    . traZODone (DESYREL) 50 MG tablet Take 0.5-1 tablets (25-50 mg total) by mouth at bedtime as needed for sleep. (Patient not taking: Reported on 04/23/2019) 30 tablet 3  . Ibuprofen-Famotidine 800-26.6 MG TABS Take 1 tablet by mouth 3 (three) times daily. (Patient not taking: Reported on 04/23/2019) 90 tablet 3   No facility-administered medications prior to visit.    Allergies  Allergen Reactions  . Prednisolone Other (See Comments)    Patient can't remember  Other reaction(s): Delusions (intolerance) Patient can't remember     ROS Review of Systems  Constitutional: Negative.   HENT: Negative.   Eyes: Negative.   Respiratory: Negative.   Cardiovascular: Negative.   Gastrointestinal: Negative.   Endocrine: Negative.   Genitourinary: Negative.   Musculoskeletal: Negative.   Skin: Negative.   Allergic/Immunologic: Negative.    Neurological: Negative.   Hematological: Negative.   Psychiatric/Behavioral: Negative.       Objective:    Physical Exam  Constitutional: She is oriented to person, place, and time. She appears well-developed and well-nourished.  HENT:  Head: Normocephalic.  Eyes: Conjunctivae are normal.  Cardiovascular: Normal rate, regular rhythm, normal heart sounds and intact distal pulses.  Pulmonary/Chest: Effort normal and breath sounds normal.  Abdominal: Soft. Bowel sounds are normal.  Musculoskeletal:        General: Normal range of motion.  Cervical back: Normal range of motion and neck supple.  Neurological: She is alert and oriented to person, place, and time. She has normal reflexes.  Skin: Skin is warm and dry.  Psychiatric: She has a normal mood and affect. Her behavior is normal. Judgment and thought content normal.  Nursing note and vitals reviewed.   BP (!) 143/77   Pulse 75   Temp 97.8 F (36.6 C) (Oral)   Ht 5\' 3"  (1.6 m)   Wt 218 lb (98.9 kg)   SpO2 97%   BMI 38.62 kg/m  Wt Readings from Last 3 Encounters:  10/23/19 218 lb (98.9 kg)  10/11/19 209 lb (94.8 kg)  09/13/19 205 lb (93 kg)    Health Maintenance Due  Topic Date Due  . Hepatitis C Screening  1956-05-29  . PNEUMOCOCCAL POLYSACCHARIDE VACCINE AGE 33-64 HIGH RISK  07/08/1960  . FOOT EXAM  07/08/1968  . OPHTHALMOLOGY EXAM  07/08/1968  . HIV Screening  07/08/1973  . URINE MICROALBUMIN  06/08/2017  . MAMMOGRAM  12/25/2018  . INFLUENZA VACCINE  06/09/2019    There are no preventive care reminders to display for this patient.  Lab Results  Component Value Date   TSH 1.380 07/24/2019   Lab Results  Component Value Date   WBC 8.8 02/28/2018   HGB 14.1 02/28/2018   HCT 44.7 02/28/2018   MCV 92.8 02/28/2018   PLT 245 05/09/2017   Lab Results  Component Value Date   NA 141 02/28/2018   K 4.3 02/28/2018   CO2 26 02/28/2018   GLUCOSE 87 02/28/2018   BUN 8 02/28/2018   CREATININE 0.71  02/28/2018   BILITOT 0.6 02/28/2018   ALKPHOS 94 02/28/2018   AST 20 02/28/2018   ALT 13 02/28/2018   PROT 7.4 02/28/2018   ALBUMIN 4.4 02/28/2018   CALCIUM 9.5 02/28/2018   Lab Results  Component Value Date   CHOL 277 (H) 05/09/2017   Lab Results  Component Value Date   HDL 72 05/09/2017   Lab Results  Component Value Date   LDLCALC 177 (H) 05/09/2017   Lab Results  Component Value Date   TRIG 139 05/09/2017   Lab Results  Component Value Date   CHOLHDL 3.8 05/09/2017   Lab Results  Component Value Date   HGBA1C 5.3 10/23/2019    Assessment & Plan:   1. Essential hypertension The current medical regimen is effective; blood pressure is stable at 143/77 today; continue present plan and medications as prescribed. She will continue to take medications as prescribed, to decrease high sodium intake, excessive alcohol intake, increase potassium intake, smoking cessation, and increase physical activity of at least 30 minutes of cardio activity daily. She will continue to follow Heart Healthy or DASH diet. - POCT HgB A1C  2. PTSD (post-traumatic stress disorder)  3. Anxiety Moderately elevated today. She will continue to follow up with Psychiatry as needed.   4. Neck pain  5. Healthcare maintenance - Urinalysis Dipstick - Glucose (CBG)  6. Abnormal urinalysis Results are pending.  - Urine Culture  7. Follow up She will follow up in 6 months.   No orders of the defined types were placed in this encounter.   Orders Placed This Encounter  Procedures  . Urine Culture  . POCT HgB A1C  . Urinalysis Dipstick  . Glucose (CBG)    Referral Orders  No referral(s) requested today    Kathe Becton,  MSN, FNP-BC [facility] [place], [place] [address] [phone] fax    Problem List Items Addressed This Visit      Other   Neck pain   PTSD (post-traumatic  stress disorder)    Other Visit Diagnoses    Essential hypertension    -  Primary   Relevant Orders   POCT HgB A1C (Completed)   Anxiety       Healthcare maintenance       Relevant Orders   Urinalysis Dipstick (Completed)   Glucose (CBG) (Completed)   Abnormal urinalysis       Relevant Orders   Urine Culture   Follow up          No orders of the defined types were placed in this encounter.   Follow-up: Return in about 6 months (around 04/22/2020).    Azzie Glatter, FNP

## 2019-10-25 ENCOUNTER — Ambulatory Visit (INDEPENDENT_AMBULATORY_CARE_PROVIDER_SITE_OTHER): Payer: BC Managed Care – PPO | Admitting: Psychology

## 2019-10-25 DIAGNOSIS — F4310 Post-traumatic stress disorder, unspecified: Secondary | ICD-10-CM

## 2019-10-25 LAB — URINE CULTURE

## 2019-11-05 ENCOUNTER — Ambulatory Visit (INDEPENDENT_AMBULATORY_CARE_PROVIDER_SITE_OTHER): Payer: BC Managed Care – PPO | Admitting: Psychology

## 2019-11-05 DIAGNOSIS — F4310 Post-traumatic stress disorder, unspecified: Secondary | ICD-10-CM

## 2019-11-15 ENCOUNTER — Ambulatory Visit (INDEPENDENT_AMBULATORY_CARE_PROVIDER_SITE_OTHER): Payer: BC Managed Care – PPO | Admitting: Family Medicine

## 2019-11-15 ENCOUNTER — Encounter: Payer: Self-pay | Admitting: Family Medicine

## 2019-11-15 ENCOUNTER — Other Ambulatory Visit: Payer: Self-pay

## 2019-11-15 DIAGNOSIS — F4310 Post-traumatic stress disorder, unspecified: Secondary | ICD-10-CM

## 2019-11-15 NOTE — Assessment & Plan Note (Signed)
Still having some headaches and occasional nightmares.  Still meeting with her psychologist on a regular basis. -Filled out paperwork. -Counseled supportive care. -Follow-up in 4 weeks.

## 2019-11-15 NOTE — Progress Notes (Signed)
Rebecca [name] - 64 y.o. female MRN [id number]  Date of birth: [birth year]  SUBJECTIVE:  Including CC & ROS.  Chief Complaint  Patient presents with  . Follow-up    Rebecca [name] is a 64 y.o. female that is following up for her ongoing PTSD and postconcussive symptoms.  She is having some trouble sleeping as well as nightmares from time to time.  She did have a good Christmas.  She spent some time with family.  She is seeing her psychologist on a weekly basis.  She feels that they are making good headway.   Review of Systems See HPI   HISTORY: Past Medical, Surgical, Social, and Family History Reviewed & Updated per EMR.   Pertinent Historical Findings include:  Past Medical History:  Diagnosis Date  . Hyperlipidemia   . Neck pain   . Obesity   . Peritonsillar abscess 02/03/2017  . Thyroid disease    monitoring a nodule 1/9    Past Surgical History:  Procedure Laterality Date  . ABDOMINAL HYSTERECTOMY      Allergies  Allergen Reactions  . Prednisolone Other (See Comments)    Patient can't remember  Other reaction(s): Delusions (intolerance) Patient can't remember     Family History  Problem Relation Age of Onset  . Cancer Mother        pancreatic cancer  . Diabetes Mother   . Heart disease Mother        rheumatic heart disease  . Hyperlipidemia Mother   . COPD Sister   . Sleep apnea Sister   . Cancer Sister        Lung Cancer  . Cancer Brother        lung cancer  . Cancer Father        renal, bone  . Diabetes Father   . Hyperlipidemia Father   . Heart attack Maternal Grandfather   . Kidney disease Brother   . Colon cancer Maternal Aunt      Social History   Socioeconomic History  . Marital status: Single    Spouse name: N/A  . Number of children: 0  . Years of education: 18.5  . Highest education level: Not on file  Occupational History  . Occupation: Retired    Fish farm manager: Psychologist, sport and exercise    Comment: [employer] x 30  years  . 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 Smoking status: Never Smoker  . Smokeless tobacco: Never Used  Substance and Sexual Activity  . Alcohol use: Yes    Alcohol/week: 5.0 - 7.0 standard drinks    Types: 5 - 7 Standard drinks or equivalent per week    Comment: 4-5 * week/ 1-2 drinks  . Drug use: No  . Sexual activity: Yes    Partners: Male    Birth control/protection: Surgical  Other Topics Concern  . Not on file  Social History Narrative   Close friend/significant other killed (GSW) 2012.   Retired after 30 years, and took another job (commutes to [place] 4 days/week).   Social Determinants of Health   Financial Resource Strain:   . Difficulty of Paying Living Expenses: Not on file  Food Insecurity:   . Worried About Charity fundraiser in the Last Year: Not on file  . Ran Out of Food in the Last Year: Not on file  Transportation Needs:   . Lack of Transportation (Medical): Not on file  . Lack of Transportation (  Non-Medical): Not on file  Physical Activity:   . Days of Exercise per Week: Not on file  . Minutes of Exercise per Session: Not on file  Stress:   . Feeling of Stress : Not on file  Social Connections:   . Frequency of Communication with Friends and Family: Not on file  . Frequency of Social Gatherings with Friends and Family: Not on file  . Attends Religious Services: Not on file  . Active Member of Clubs or Organizations: Not on file  . Attends Archivist Meetings: Not on file  . Marital Status: Not on file  Intimate Partner Violence:   . Fear of Current or Ex-Partner: Not on file  . Emotionally Abused: Not on file  . Physically Abused: Not on file  . Sexually Abused: Not on file     PHYSICAL EXAM:  VS: BP 134/80   Pulse 85   Ht 5\' 3"  (1.6 m)   Wt 215 lb (97.5 kg)   BMI 38.09 kg/m  Physical Exam Gen: NAD, alert, cooperative with exam, well-appearing ENT: normal lips, normal nasal  mucosa,  Eye: normal EOM, normal conjunctiva and lids Skin: no rashes, no areas of induration  Neuro: normal tone, normal sensation to touch Psych:  normal insight, alert and oriented MSK: Normal gait, normal strength     ASSESSMENT & PLAN:   PTSD (post-traumatic stress disorder) Still having some headaches and occasional nightmares.  Still meeting with her psychologist on a regular basis. -Filled out paperwork. -Counseled supportive care. -Follow-up in 4 weeks.

## 2019-11-22 ENCOUNTER — Ambulatory Visit (INDEPENDENT_AMBULATORY_CARE_PROVIDER_SITE_OTHER): Payer: 59 | Admitting: Psychology

## 2019-11-22 DIAGNOSIS — F4310 Post-traumatic stress disorder, unspecified: Secondary | ICD-10-CM | POA: Diagnosis not present

## 2019-11-27 ENCOUNTER — Ambulatory Visit (INDEPENDENT_AMBULATORY_CARE_PROVIDER_SITE_OTHER): Payer: 59 | Admitting: Psychology

## 2019-11-27 DIAGNOSIS — F4310 Post-traumatic stress disorder, unspecified: Secondary | ICD-10-CM | POA: Diagnosis not present

## 2019-12-06 ENCOUNTER — Ambulatory Visit (INDEPENDENT_AMBULATORY_CARE_PROVIDER_SITE_OTHER): Payer: 59 | Admitting: Psychology

## 2019-12-06 DIAGNOSIS — F4310 Post-traumatic stress disorder, unspecified: Secondary | ICD-10-CM | POA: Diagnosis not present

## 2019-12-10 ENCOUNTER — Other Ambulatory Visit: Payer: Self-pay

## 2019-12-10 ENCOUNTER — Ambulatory Visit (INDEPENDENT_AMBULATORY_CARE_PROVIDER_SITE_OTHER): Payer: BC Managed Care – PPO | Admitting: Family Medicine

## 2019-12-10 ENCOUNTER — Encounter: Payer: Self-pay | Admitting: Family Medicine

## 2019-12-10 VITALS — BP 159/96 | HR 60 | Ht 63.0 in | Wt 215.0 lb

## 2019-12-10 DIAGNOSIS — R079 Chest pain, unspecified: Secondary | ICD-10-CM | POA: Diagnosis not present

## 2019-12-10 DIAGNOSIS — F4310 Post-traumatic stress disorder, unspecified: Secondary | ICD-10-CM | POA: Diagnosis not present

## 2019-12-10 NOTE — Assessment & Plan Note (Signed)
Symptoms may be atypical in nature. -Counseled that she should be seen in the emergency department if pain is ongoing in order to have a thorough work-up to make sure is not cardiac in nature.

## 2019-12-10 NOTE — Progress Notes (Signed)
Rebecca [name] - 64 y.o. female MRN [id number]  Date of birth: [birth year]  SUBJECTIVE:  Including CC & ROS.  Chief Complaint  Patient presents with  . Follow-up    Rebecca [name] is a 64 y.o. female that is following up for her PTSD and is presenting with headache and chest pain.  The chest pain is been ongoing since Saturday.  She does have some improvement today.  Denies any diaphoresis or shortness of breath.  No history of similar symptoms.    Review of Systems See HPI   HISTORY: Past Medical, Surgical, Social, and Family History Reviewed & Updated per EMR.   Pertinent Historical Findings include:  Past Medical History:  Diagnosis Date  . Hyperlipidemia   . Neck pain   . Obesity   . Peritonsillar abscess 02/03/2017  . Thyroid disease    monitoring a nodule 1/9    Past Surgical History:  Procedure Laterality Date  . ABDOMINAL HYSTERECTOMY      Allergies  Allergen Reactions  . Prednisolone Other (See Comments)    Patient can't remember  Other reaction(s): Delusions (intolerance) Patient can't remember     Family History  Problem Relation Age of Onset  . Cancer Mother        pancreatic cancer  . Diabetes Mother   . Heart disease Mother        rheumatic heart disease  . Hyperlipidemia Mother   . COPD Sister   . Sleep apnea Sister   . Cancer Sister        Lung Cancer  . Cancer Brother        lung cancer  . Cancer Father        renal, bone  . Diabetes Father   . Hyperlipidemia Father   . Heart attack Maternal Grandfather   . Kidney disease Brother   . Colon cancer Maternal Aunt      Social History   Socioeconomic History  . Marital status: Single    Spouse name: N/A  . Number of children: 0  . Years of education: 18.5  . Highest education level: Not on file  Occupational History  . Occupation: Retired    Fish farm manager: Psychologist, sport and exercise    Comment: [employer] x 30 years  . 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 Smoking status: Never Smoker  . Smokeless tobacco: Never Used  Substance and Sexual Activity  . Alcohol use: Yes    Alcohol/week: 5.0 - 7.0 standard drinks    Types: 5 - 7 Standard drinks or equivalent per week    Comment: 4-5 * week/ 1-2 drinks  . Drug use: No  . Sexual activity: Yes    Partners: Male    Birth control/protection: Surgical  Other Topics Concern  . Not on file  Social History Narrative   Close friend/significant other killed (GSW) 2012.   Retired after 30 years, and took another job (commutes to [place] 4 days/week).   Social Determinants of Health   Financial Resource Strain:   . Difficulty of Paying Living Expenses: Not on file  Food Insecurity:   . Worried About Charity fundraiser in the Last Year: Not on file  . Ran Out of Food in the Last Year: Not on file  Transportation Needs:   . Lack of Transportation (Medical): Not on file  . Lack of Transportation (Non-Medical): Not on file  Physical Activity:   . Days of Exercise per Week:  Not on file  . Minutes of Exercise per Session: Not on file  Stress:   . Feeling of Stress : Not on file  Social Connections:   . Frequency of Communication with Friends and Family: Not on file  . Frequency of Social Gatherings with Friends and Family: Not on file  . Attends Religious Services: Not on file  . Active Member of Clubs or Organizations: Not on file  . Attends Archivist Meetings: Not on file  . Marital Status: Not on file  Intimate Partner Violence:   . Fear of Current or Ex-Partner: Not on file  . Emotionally Abused: Not on file  . Physically Abused: Not on file  . Sexually Abused: Not on file     PHYSICAL EXAM:  VS: BP (!) 159/96   Pulse 60   Ht 5\' 3"  (1.6 m)   Wt 215 lb (97.5 kg)   BMI 38.09 kg/m  Physical Exam Gen: NAD, alert, cooperative with exam, well-appearing ENT: normal lips, normal nasal mucosa,  Eye: normal EOM, normal conjunctiva and  lids Skin: no rashes, no areas of induration  Neuro: normal tone, normal sensation to touch Psych:  normal insight, alert and oriented       ASSESSMENT & PLAN:   PTSD (post-traumatic stress disorder) Has trouble with sleeping and has ongoing headache. -Could consider switching from Zoloft. -Could consider initiating something for sleep.  Chest pain Symptoms may be atypical in nature. -Counseled that she should be seen in the emergency department if pain is ongoing in order to have a thorough work-up to make sure is not cardiac in nature.

## 2019-12-10 NOTE — Assessment & Plan Note (Signed)
Has trouble with sleeping and has ongoing headache. -Could consider switching from Zoloft. -Could consider initiating something for sleep.

## 2019-12-11 ENCOUNTER — Ambulatory Visit (INDEPENDENT_AMBULATORY_CARE_PROVIDER_SITE_OTHER): Payer: BC Managed Care – PPO | Admitting: Psychology

## 2019-12-11 DIAGNOSIS — F4310 Post-traumatic stress disorder, unspecified: Secondary | ICD-10-CM

## 2019-12-13 ENCOUNTER — Ambulatory Visit: Payer: BC Managed Care – PPO | Admitting: Family Medicine

## 2019-12-20 ENCOUNTER — Ambulatory Visit: Payer: BC Managed Care – PPO | Admitting: Psychology

## 2019-12-24 ENCOUNTER — Ambulatory Visit: Payer: BC Managed Care – PPO | Attending: Internal Medicine

## 2019-12-24 DIAGNOSIS — Z20822 Contact with and (suspected) exposure to covid-19: Secondary | ICD-10-CM

## 2019-12-25 ENCOUNTER — Ambulatory Visit: Payer: BC Managed Care – PPO | Attending: Internal Medicine

## 2019-12-25 LAB — NOVEL CORONAVIRUS, NAA: SARS-CoV-2, NAA: NOT DETECTED

## 2020-01-10 ENCOUNTER — Ambulatory Visit (INDEPENDENT_AMBULATORY_CARE_PROVIDER_SITE_OTHER): Payer: 59 | Admitting: Family Medicine

## 2020-01-10 ENCOUNTER — Encounter: Payer: Self-pay | Admitting: Family Medicine

## 2020-01-10 ENCOUNTER — Other Ambulatory Visit: Payer: Self-pay

## 2020-01-10 DIAGNOSIS — F4310 Post-traumatic stress disorder, unspecified: Secondary | ICD-10-CM

## 2020-01-10 NOTE — Assessment & Plan Note (Signed)
Still having sleep issues and headaches. Has started a gym class.  - counseled on supportive care

## 2020-01-10 NOTE — Progress Notes (Signed)
Rebecca [name] - 64 y.o. female MRN [id number]  Date of birth: Jan 24, 1956  SUBJECTIVE:  Including CC & ROS.  Chief Complaint  Patient presents with  . Follow-up    Rebecca [name] is a 64 y.o. female that is following up for her PTSD. Reports trouble sleeping and has headaches.    Review of Systems See HPI   HISTORY: Past Medical, Surgical, Social, and Family History Reviewed & Updated per EMR.   Pertinent Historical Findings include:  Past Medical History:  Diagnosis Date  . Hyperlipidemia   . Neck pain   . Obesity   . Peritonsillar abscess 02/03/2017  . Thyroid disease    monitoring a nodule 1/9    Past Surgical History:  Procedure Laterality Date  . ABDOMINAL HYSTERECTOMY      Family History  Problem Relation Age of Onset  . Cancer Mother        pancreatic cancer  . Diabetes Mother   . Heart disease Mother        rheumatic heart disease  . Hyperlipidemia Mother   . COPD Sister   . Sleep apnea Sister   . Cancer Sister        Lung Cancer  . Cancer Brother        lung cancer  . Cancer Father        renal, bone  . Diabetes Father   . Hyperlipidemia Father   . Heart attack Maternal Grandfather   . Kidney disease Brother   . Colon cancer Maternal Aunt     Social History   Socioeconomic History  . Marital status: Single    Spouse name: N/A  . Number of children: 0  . Years of education: 18.5  . Highest education level: Not on file  Occupational History  . Occupation: Retired    Fish farm manager: Psychologist, sport and exercise    Comment: [employer] x 30 years  . 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 Smoking status: Never Smoker  . Smokeless tobacco: Never Used  Substance and Sexual Activity  . Alcohol use: Yes    Alcohol/week: 5.0 - 7.0 standard drinks    Types: 5 - 7 Standard drinks or equivalent per week    Comment: 4-5 * week/ 1-2 drinks  . Drug use: No  . Sexual activity: Yes    Partners: Male      Birth control/protection: Surgical  Other Topics Concern  . Not on file  Social History Narrative   Close friend/significant other killed (GSW) 2012.   Retired after 30 years, and took another job (commutes to [place] 4 days/week).   Social Determinants of Health   Financial Resource Strain:   . Difficulty of Paying Living Expenses: Not on file  Food Insecurity:   . Worried About Charity fundraiser in the Last Year: Not on file  . Ran Out of Food in the Last Year: Not on file  Transportation Needs:   . Lack of Transportation (Medical): Not on file  . Lack of Transportation (Non-Medical): Not on file  Physical Activity:   . Days of Exercise per Week: Not on file  . Minutes of Exercise per Session: Not on file  Stress:   . Feeling of Stress : Not on file  Social Connections:   . Frequency of Communication with Friends and Family: Not on file  . Frequency of Social Gatherings with Friends and Family: Not on file  . Attends Religious  Services: Not on file  . Active Member of Clubs or Organizations: Not on file  . Attends Archivist Meetings: Not on file  . Marital Status: Not on file  Intimate Partner Violence:   . Fear of Current or Ex-Partner: Not on file  . Emotionally Abused: Not on file  . Physically Abused: Not on file  . Sexually Abused: Not on file     PHYSICAL EXAM:  VS: BP 135/80   Pulse 90   Ht 5\' 3"  (1.6 m)   Wt 220 lb (99.8 kg)   BMI 38.97 kg/m  Physical Exam Gen: NAD, alert, cooperative with exam, well-appearing      ASSESSMENT & PLAN:   PTSD (post-traumatic stress disorder) Still having sleep issues and headaches. Has started a gym class.  - counseled on supportive care

## 2020-02-07 ENCOUNTER — Encounter: Payer: Self-pay | Admitting: Family Medicine

## 2020-02-07 ENCOUNTER — Ambulatory Visit (INDEPENDENT_AMBULATORY_CARE_PROVIDER_SITE_OTHER): Payer: BC Managed Care – PPO | Admitting: Family Medicine

## 2020-02-07 ENCOUNTER — Other Ambulatory Visit: Payer: Self-pay

## 2020-02-07 DIAGNOSIS — F4310 Post-traumatic stress disorder, unspecified: Secondary | ICD-10-CM | POA: Diagnosis not present

## 2020-02-07 NOTE — Progress Notes (Signed)
Rebecca [name] - 64 y.o. female MRN [id number]  Date of birth: 24-Oct-1956  SUBJECTIVE:  Including CC & ROS.  No chief complaint on file.   Rebecca [name] is a 64 y.o. female that is following up for ongoing postconcussive symptoms, PTSD and anxiety.  She is having trouble sleeping.  She has intermittent headaches.  She has during the day and is working out on a regular basis.   Review of Systems See HPI   HISTORY: Past Medical, Surgical, Social, and Family History Reviewed & Updated per EMR.   Pertinent Historical Findings include:  Past Medical History:  Diagnosis Date  . Hyperlipidemia   . Neck pain   . Obesity   . Peritonsillar abscess 02/03/2017  . Thyroid disease    monitoring a nodule 1/9    Past Surgical History:  Procedure Laterality Date  . ABDOMINAL HYSTERECTOMY      Family History  Problem Relation Age of Onset  . Cancer Mother        pancreatic cancer  . Diabetes Mother   . Heart disease Mother        rheumatic heart disease  . Hyperlipidemia Mother   . COPD Sister   . Sleep apnea Sister   . Cancer Sister        Lung Cancer  . Cancer Brother        lung cancer  . Cancer Father        renal, bone  . Diabetes Father   . Hyperlipidemia Father   . Heart attack Maternal Grandfather   . Kidney disease Brother   . Colon cancer Maternal Aunt     Social History   Socioeconomic History  . Marital status: Single    Spouse name: N/A  . Number of children: 0  . Years of education: 18.5  . Highest education level: Not on file  Occupational History  . Occupation: Retired    Fish farm manager: Psychologist, sport and exercise    Comment: [employer] x 30 years  . 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 Smoking status: Never Smoker  . Smokeless tobacco: Never Used  Substance and Sexual Activity  . Alcohol use: Yes    Alcohol/week: 5.0 - 7.0 standard drinks    Types: 5 - 7 Standard drinks or equivalent per week     Comment: 4-5 * week/ 1-2 drinks  . Drug use: No  . Sexual activity: Yes    Partners: Male    Birth control/protection: Surgical  Other Topics Concern  . Not on file  Social History Narrative   Close friend/significant other killed (GSW) 2012.   Retired after 30 years, and took another job (commutes to [place] 4 days/week).   Social Determinants of Health   Financial Resource Strain:   . Difficulty of Paying Living Expenses:   Food Insecurity:   . Worried About Charity fundraiser in the Last Year:   . Arboriculturist in the Last Year:   Transportation Needs:   . Film/video editor (Medical):   Marland Kitchen Lack of Transportation (Non-Medical):   Physical Activity:   . Days of Exercise per Week:   . Minutes of Exercise per Session:   Stress:   . Feeling of Stress :   Social Connections:   . Frequency of Communication with Friends and Family:   . Frequency of Social Gatherings with Friends and Family:   . Attends Religious Services:   .  Active Member of Clubs or Organizations:   . Attends Archivist Meetings:   Marland Kitchen Marital Status:   Intimate Partner Violence:   . Fear of Current or Ex-Partner:   . Emotionally Abused:   Marland Kitchen Physically Abused:   . Sexually Abused:      PHYSICAL EXAM:  VS: BP 128/70   Ht 5\' 3"  (1.6 m)   Wt 220 lb (99.8 kg)   BMI 38.97 kg/m  Physical Exam Gen: NAD, alert, cooperative with exam, well-appearing      ASSESSMENT & PLAN:   PTSD (post-traumatic stress disorder) Has joined a gym and is setting goals for herself. -Follow-up in 1 month.

## 2020-02-08 NOTE — Assessment & Plan Note (Signed)
Has joined a gym and is setting goals for herself. -Follow-up in 1 month.

## 2020-02-11 ENCOUNTER — Ambulatory Visit (INDEPENDENT_AMBULATORY_CARE_PROVIDER_SITE_OTHER): Payer: BC Managed Care – PPO | Admitting: Psychology

## 2020-02-11 DIAGNOSIS — F4310 Post-traumatic stress disorder, unspecified: Secondary | ICD-10-CM | POA: Diagnosis not present

## 2020-02-22 ENCOUNTER — Ambulatory Visit (INDEPENDENT_AMBULATORY_CARE_PROVIDER_SITE_OTHER): Payer: BC Managed Care – PPO | Admitting: Psychology

## 2020-02-22 DIAGNOSIS — F4310 Post-traumatic stress disorder, unspecified: Secondary | ICD-10-CM | POA: Diagnosis not present

## 2020-02-28 ENCOUNTER — Ambulatory Visit (INDEPENDENT_AMBULATORY_CARE_PROVIDER_SITE_OTHER): Payer: BC Managed Care – PPO | Admitting: Psychology

## 2020-02-28 DIAGNOSIS — F4310 Post-traumatic stress disorder, unspecified: Secondary | ICD-10-CM

## 2020-03-13 ENCOUNTER — Encounter: Payer: Self-pay | Admitting: Family Medicine

## 2020-03-13 ENCOUNTER — Ambulatory Visit (INDEPENDENT_AMBULATORY_CARE_PROVIDER_SITE_OTHER): Payer: BC Managed Care – PPO | Admitting: Family Medicine

## 2020-03-13 ENCOUNTER — Other Ambulatory Visit: Payer: Self-pay

## 2020-03-13 DIAGNOSIS — F4310 Post-traumatic stress disorder, unspecified: Secondary | ICD-10-CM

## 2020-03-13 MED ORDER — SERTRALINE HCL 25 MG PO TABS: 75.0000 mg | ORAL_TABLET | Freq: Every day | ORAL | 1 refills | Status: DC

## 2020-03-13 NOTE — Progress Notes (Signed)
Rebecca [name] - 64 y.o. female MRN [id number]  Date of birth: September 25, 1956  SUBJECTIVE:  Including CC & ROS.  Chief Complaint  Patient presents with  . Follow-up    Rebecca [name] is a 64 y.o. female that is following up for her PTSD symptoms.  Still having headaches and does not sleep well at night.  She is walking at least twice a week..   Review of Systems See HPI   HISTORY: Past Medical, Surgical, Social, and Family History Reviewed & Updated per EMR.   Pertinent Historical Findings include:  Past Medical History:  Diagnosis Date  . Hyperlipidemia   . Neck pain   . Obesity   . Peritonsillar abscess 02/03/2017  . Thyroid disease    monitoring a nodule 1/9    Past Surgical History:  Procedure Laterality Date  . ABDOMINAL HYSTERECTOMY      Family History  Problem Relation Age of Onset  . Cancer Mother        pancreatic cancer  . Diabetes Mother   . Heart disease Mother        rheumatic heart disease  . Hyperlipidemia Mother   . COPD Sister   . Sleep apnea Sister   . Cancer Sister        Lung Cancer  . Cancer Brother        lung cancer  . Cancer Father        renal, bone  . Diabetes Father   . Hyperlipidemia Father   . Heart attack Maternal Grandfather   . Kidney disease Brother   . Colon cancer Maternal Aunt     Social History   Socioeconomic History  . Marital status: Single    Spouse name: N/A  . Number of children: 0  . Years of education: 18.5  . Highest education level: Not on file  Occupational History  . Occupation: Retired    Fish farm manager: Psychologist, sport and exercise    Comment: [employer] x 30 years  . 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 Smoking status: Never Smoker  . Smokeless tobacco: Never Used  Substance and Sexual Activity  . Alcohol use: Yes    Alcohol/week: 5.0 - 7.0 standard drinks    Types: 5 - 7 Standard drinks or equivalent per week    Comment: 4-5 * week/ 1-2 drinks   . Drug use: No  . Sexual activity: Yes    Partners: Male    Birth control/protection: Surgical  Other Topics Concern  . Not on file  Social History Narrative   Close friend/significant other killed (GSW) 2012.   Retired after 30 years, and took another job (commutes to [place] 4 days/week).   Social Determinants of Health   Financial Resource Strain:   . Difficulty of Paying Living Expenses:   Food Insecurity:   . Worried About Charity fundraiser in the Last Year:   . Arboriculturist in the Last Year:   Transportation Needs:   . Film/video editor (Medical):   Marland Kitchen Lack of Transportation (Non-Medical):   Physical Activity:   . Days of Exercise per Week:   . Minutes of Exercise per Session:   Stress:   . Feeling of Stress :   Social Connections:   . Frequency of Communication with Friends and Family:   . Frequency of Social Gatherings with Friends and Family:   . Attends Religious Services:   . Active Member of  Clubs or Organizations:   . Attends Archivist Meetings:   Marland Kitchen Marital Status:   Intimate Partner Violence:   . Fear of Current or Ex-Partner:   . Emotionally Abused:   Marland Kitchen Physically Abused:   . Sexually Abused:      PHYSICAL EXAM:  VS: BP 137/86   Pulse 71   Ht 5\' 3"  (1.6 m)   Wt 220 lb (99.8 kg)   BMI 38.97 kg/m  Physical Exam Gen: NAD, alert, cooperative with exam, well-appearing       ASSESSMENT & PLAN:   PTSD (post-traumatic stress disorder) Is not sleeping well and has lack of concentration.  She gets headaches on a regular basis as well.  She is still going through with psychology.  Discussed different treatment options in terms of changing medications or trying new ones.  She has tried other medications without much improvement.  Could consider referral to sleep medicine.  Will monitor for now.  Refilled Zoloft.

## 2020-03-13 NOTE — Assessment & Plan Note (Addendum)
Is not sleeping well and has lack of concentration.  She gets headaches on a regular basis as well.  She is still going through with psychology.  Discussed different treatment options in terms of changing medications or trying new ones.  She has tried other medications without much improvement.  Could consider referral to sleep medicine.  Will monitor for now.  Refilled Zoloft.

## 2020-04-10 ENCOUNTER — Other Ambulatory Visit: Payer: Self-pay

## 2020-04-10 ENCOUNTER — Ambulatory Visit (INDEPENDENT_AMBULATORY_CARE_PROVIDER_SITE_OTHER): Payer: BC Managed Care – PPO | Admitting: Family Medicine

## 2020-04-10 ENCOUNTER — Encounter: Payer: Self-pay | Admitting: Family Medicine

## 2020-04-10 DIAGNOSIS — F4310 Post-traumatic stress disorder, unspecified: Secondary | ICD-10-CM

## 2020-04-10 NOTE — Progress Notes (Signed)
Rebecca [name] - 64 y.o. female MRN [id number]  Date of birth: [birth year]  SUBJECTIVE:  Including CC & ROS.  Chief Complaint  Patient presents with  . Follow-up    Rebecca [name] is a 64 y.o. female that is following up for her PTSD and postconcussive symptoms.  She has been having a hard day today after hearing that was concluded.  She is tearful during the interview.  She is still taking the Zoloft.  She has been following up with her psychologist.   Review of Systems See HPI   HISTORY: Past Medical, Surgical, Social, and Family History Reviewed & Updated per EMR.   Pertinent Historical Findings include:  Past Medical History:  Diagnosis Date  . Hyperlipidemia   . Neck pain   . Obesity   . Peritonsillar abscess 02/03/2017  . Thyroid disease    monitoring a nodule 1/9    Past Surgical History:  Procedure Laterality Date  . ABDOMINAL HYSTERECTOMY      Family History  Problem Relation Age of Onset  . Cancer Mother        pancreatic cancer  . Diabetes Mother   . Heart disease Mother        rheumatic heart disease  . Hyperlipidemia Mother   . COPD Sister   . Sleep apnea Sister   . Cancer Sister        Lung Cancer  . Cancer Brother        lung cancer  . Cancer Father        renal, bone  . Diabetes Father   . Hyperlipidemia Father   . Heart attack Maternal Grandfather   . Kidney disease Brother   . Colon cancer Maternal Aunt     Social History   Socioeconomic History  . Marital status: Single    Spouse name: N/A  . Number of children: 0  . Years of education: 18.5  . Highest education level: Not on file  Occupational History  . Occupation: Retired    Fish farm manager: Psychologist, sport and exercise    Comment: [employer] x 30 years  . 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 Smoking status: Never Smoker  . Smokeless tobacco: Never Used  Substance and Sexual Activity  . Alcohol use: Yes    Alcohol/week: 5.0  - 7.0 standard drinks    Types: 5 - 7 Standard drinks or equivalent per week    Comment: 4-5 * week/ 1-2 drinks  . Drug use: No  . Sexual activity: Yes    Partners: Male    Birth control/protection: Surgical  Other Topics Concern  . Not on file  Social History Narrative   Close friend/significant other killed (GSW) 2012.   Retired after 30 years, and took another job (commutes to [place] 4 days/week).   Social Determinants of Health   Financial Resource Strain:   . Difficulty of Paying Living Expenses:   Food Insecurity:   . Worried About Charity fundraiser in the Last Year:   . Arboriculturist in the Last Year:   Transportation Needs:   . Film/video editor (Medical):   Marland Kitchen Lack of Transportation (Non-Medical):   Physical Activity:   . Days of Exercise per Week:   . Minutes of Exercise per Session:   Stress:   . Feeling of Stress :   Social Connections:   . Frequency of Communication with Friends and Family:   . Frequency  of Social Gatherings with Friends and Family:   . Attends Religious Services:   . Active Member of Clubs or Organizations:   . Attends Archivist Meetings:   Marland Kitchen Marital Status:   Intimate Partner Violence:   . Fear of Current or Ex-Partner:   . Emotionally Abused:   Marland Kitchen Physically Abused:   . Sexually Abused:      PHYSICAL EXAM:  VS: BP (!) 155/95   Pulse 81   Ht 5\' 3"  (1.6 m)   Wt 209 lb (94.8 kg)   BMI 37.02 kg/m  Physical Exam Gen: NAD, alert, cooperative with exam, well-appearing     ASSESSMENT & PLAN:   PTSD (post-traumatic stress disorder) Having a fairly hard day today.  She had a hearing about her case earlier today.  She still has having headaches and trouble sleeping from time to time.  She is still following up with her psychiatrist. -Counseled on supportive care. -Counseled on grief.

## 2020-04-10 NOTE — Assessment & Plan Note (Signed)
Having a fairly hard day today.  She had a hearing about her case earlier today.  She still has having headaches and trouble sleeping from time to time.  She is still following up with her psychiatrist. -Counseled on supportive care. -Counseled on grief.

## 2020-04-21 ENCOUNTER — Ambulatory Visit (INDEPENDENT_AMBULATORY_CARE_PROVIDER_SITE_OTHER): Payer: BC Managed Care – PPO | Admitting: Psychology

## 2020-04-21 DIAGNOSIS — F4310 Post-traumatic stress disorder, unspecified: Secondary | ICD-10-CM

## 2020-04-22 ENCOUNTER — Ambulatory Visit (INDEPENDENT_AMBULATORY_CARE_PROVIDER_SITE_OTHER): Payer: BC Managed Care – PPO | Admitting: Family Medicine

## 2020-04-22 ENCOUNTER — Encounter: Payer: Self-pay | Admitting: Family Medicine

## 2020-04-22 ENCOUNTER — Other Ambulatory Visit: Payer: Self-pay

## 2020-04-22 VITALS — BP 116/67 | HR 71 | Temp 98.1°F | Wt 202.0 lb

## 2020-04-22 DIAGNOSIS — Z0000 Encounter for general adult medical examination without abnormal findings: Secondary | ICD-10-CM

## 2020-04-22 DIAGNOSIS — F419 Anxiety disorder, unspecified: Secondary | ICD-10-CM

## 2020-04-22 DIAGNOSIS — F4310 Post-traumatic stress disorder, unspecified: Secondary | ICD-10-CM | POA: Diagnosis not present

## 2020-04-22 DIAGNOSIS — I10 Essential (primary) hypertension: Secondary | ICD-10-CM

## 2020-04-22 DIAGNOSIS — M542 Cervicalgia: Secondary | ICD-10-CM | POA: Diagnosis not present

## 2020-04-22 DIAGNOSIS — Z09 Encounter for follow-up examination after completed treatment for conditions other than malignant neoplasm: Secondary | ICD-10-CM

## 2020-04-22 MED ORDER — CYCLOBENZAPRINE HCL 10 MG PO TABS: 10.0000 mg | ORAL_TABLET | Freq: Three times a day (TID) | ORAL | 3 refills | Status: DC | PRN

## 2020-04-22 NOTE — Progress Notes (Signed)
Patient [facility]    Established Patient Office Visit  Subjective:  Patient ID: Rebecca [name], female    DOB: [birth year]  Age: 64 y.o. MRN: [id number]  CC:  Chief Complaint  Patient presents with  . Follow-up    6 month follow up, no concerns    HPI Rebecca [name] is a 64 year old female who presents for Follow Up today.   Patient Active Problem List   Diagnosis Date Noted  . Chest pain 12/10/2019  . Essential hypertension 10/23/2019  . Cervical strain 07/24/2019  . History of thyroid nodule 07/24/2019  . Change in stool 07/24/2019  . PTSD (post-traumatic stress disorder) 09/06/2018  . Concussion with no loss of consciousness 03/17/2018  . Elevated blood pressure 07/16/2016  . Anxiety state 07/03/2016  . Neck pain 07/03/2016  . 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 Acid reflux disease 11/27/2014  . Special screening for malignant neoplasms, colon 09/17/2013  . Obesity, unspecified 01/16/2013  . Spinal stenosis in cervical region 01/11/2013  . Multiple thyroid nodules 01/11/2013  . Hand pain 06/16/2012  . Hyperlipidemia 06/16/2012    Past Medical History:  Diagnosis Date  . Hyperlipidemia   . Neck pain   . Obesity   . Peritonsillar abscess 02/03/2017  . Thyroid disease    monitoring a nodule 1/9   Current Status: Since her last office visit, she is doing well with no complaints. Her anxiety is stable today. She continues to follow up with Psychiatry as needed.  She denies suicidal ideations, homicidal ideations, or auditory hallucinations. She denies fevers, chills, fatigue, recent infections, weight loss, and night sweats. She has not had any headaches, visual changes, dizziness, and falls. No chest pain, heart palpitations, cough and shortness of breath reported. Denies GI problems such as nausea, vomiting, diarrhea, and constipation. She has no reports of blood in stools, dysuria and hematuria. She is taking all medications as  prescribed. She denies pain today.    Past Surgical History:  Procedure Laterality Date  . ABDOMINAL HYSTERECTOMY      Family History  Problem Relation Age of Onset  . Cancer Mother        pancreatic cancer  . Diabetes Mother   . Heart disease Mother        rheumatic heart disease  . Hyperlipidemia Mother   . COPD Sister   . Sleep apnea Sister   . Cancer Sister        Lung Cancer  . Cancer Brother        lung cancer  . Cancer Father        renal, bone  . Diabetes Father   . Hyperlipidemia Father   . Heart attack Maternal Grandfather   . Kidney disease Brother   . Colon cancer Maternal Aunt     Social History   Socioeconomic History  . Marital status: Single    Spouse name: N/A  . Number of children: 0  . Years of education: 18.5  . Highest education level: Not on file  Occupational History  . Occupation: Retired    Fish farm manager: Psychologist, sport and exercise    Comment: [employer] x 30 years  . 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 Smoking status: Never Smoker  . Smokeless tobacco: Never Used  Vaping Use  . Vaping Use: Never used  Substance and Sexual Activity  . Alcohol use: Yes    Alcohol/week: 5.0 -  7.0 standard drinks    Types: 5 - 7 Standard drinks or equivalent per week    Comment: occ  . Drug use: No  . Sexual activity: Yes    Partners: Male    Birth control/protection: Surgical  Other Topics Concern  . Not on file  Social History Narrative   Close friend/significant other killed (GSW) 2012.   Retired after 30 years, and took another job (commutes to [place] 4 days/week).   Social Determinants of Health   Financial Resource Strain:   . Difficulty of Paying Living Expenses:   Food Insecurity:   . Worried About Charity fundraiser in the Last Year:   . Arboriculturist in the Last Year:   Transportation Needs:   . Film/video editor (Medical):   Marland Kitchen Lack of Transportation (Non-Medical):     Physical Activity:   . Days of Exercise per Week:   . Minutes of Exercise per Session:   Stress:   . Feeling of Stress :   Social Connections:   . Frequency of Communication with Friends and Family:   . Frequency of Social Gatherings with Friends and Family:   . Attends Religious Services:   . Active Member of Clubs or Organizations:   . Attends Archivist Meetings:   Marland Kitchen Marital Status:   Intimate Partner Violence:   . Fear of Current or Ex-Partner:   . Emotionally Abused:   Marland Kitchen Physically Abused:   . Sexually Abused:     Outpatient Medications Prior to Visit  Medication Sig Dispense Refill  . cholecalciferol (VITAMIN D) 1000 units tablet Take 1,000 Units by mouth daily.    . Omega-3 Fatty Acids (FISH OIL) 1000 MG CAPS Take by mouth.    . pyridOXINE (VITAMIN B-6) 100 MG tablet Take 100 mg by mouth daily.    . sertraline (ZOLOFT) 25 MG tablet Take 3 tablets (75 mg total) by mouth daily. 90 tablet 1  . acetaminophen (TYLENOL) 500 MG tablet Take 1 tablet (500 mg total) by mouth every 6 (six) hours as needed. (Patient not taking: Reported on 04/22/2020) 30 tablet 0  . ibuprofen (ADVIL,MOTRIN) 600 MG tablet Take 1 tablet (600 mg total) by mouth every 6 (six) hours as needed. (Patient not taking: Reported on 04/22/2020) 30 tablet 0  . phentermine 37.5 MG capsule Take 37.5 mg by mouth every morning. (Patient not taking: Reported on 04/22/2020)    . traZODone (DESYREL) 50 MG tablet Take 0.5-1 tablets (25-50 mg total) by mouth at bedtime as needed for sleep. (Patient not taking: Reported on 04/23/2019) 30 tablet 3  . cyclobenzaprine (FLEXERIL) 10 MG tablet Take 1 tablet (10 mg total) by mouth 3 (three) times daily as needed for muscle spasms. (Patient not taking: Reported on 04/23/2019) 30 tablet 2   No facility-administered medications prior to visit.    Allergies  Allergen Reactions  . Prednisolone Other (See Comments)    Patient can't remember  Other reaction(s): Delusions  (intolerance) Patient can't remember     ROS Review of Systems  Constitutional: Negative.   HENT: Negative.   Eyes: Negative.   Respiratory: Negative.   Cardiovascular: Negative.   Gastrointestinal: Negative.   Endocrine: Negative.   Genitourinary: Negative.   Musculoskeletal: Positive for arthralgias (generalized joint pain).  Skin: Negative.   Allergic/Immunologic: Negative.   Neurological: Positive for dizziness (occasional ) and headaches (occasional ).  Hematological: Negative.   Psychiatric/Behavioral: Negative.       Objective:  Physical Exam Vitals and nursing note reviewed.  Constitutional:      Appearance: Normal appearance.  HENT:     Head: Normocephalic and atraumatic.     Nose: Nose normal.     Mouth/Throat:     Mouth: Mucous membranes are moist.  Eyes:     Pupils: Pupils are equal, round, and reactive to light.  Cardiovascular:     Rate and Rhythm: Normal rate and regular rhythm.     Pulses: Normal pulses.     Heart sounds: Normal heart sounds.  Pulmonary:     Effort: Pulmonary effort is normal.     Breath sounds: Normal breath sounds.  Abdominal:     General: Bowel sounds are normal.     Palpations: Abdomen is soft.  Musculoskeletal:        General: Normal range of motion.     Cervical back: Normal range of motion and neck supple.  Skin:    General: Skin is warm and dry.  Neurological:     General: No focal deficit present.     Mental Status: She is alert and oriented to person, place, and time.  Psychiatric:        Mood and Affect: Mood normal.        Behavior: Behavior normal.        Thought Content: Thought content normal.        Judgment: Judgment normal.     BP 116/67 (BP Location: Left Arm, Patient Position: Sitting, Cuff Size: Small)   Pulse 71   Temp 98.1 F (36.7 C)   Wt 202 lb (91.6 kg)   SpO2 100%   BMI 35.78 kg/m  Wt Readings from Last 3 Encounters:  04/22/20 202 lb (91.6 kg)  04/10/20 209 lb (94.8 kg)  03/13/20 220  lb (99.8 kg)     Health Maintenance Due  Topic Date Due  . Hepatitis C Screening  Never done  . PNEUMOCOCCAL POLYSACCHARIDE VACCINE AGE 62-64 HIGH RISK  Never done  . FOOT EXAM  Never done  . OPHTHALMOLOGY EXAM  Never done  . COVID-19 Vaccine (1) Never done  . HIV Screening  Never done  . URINE MICROALBUMIN  06/08/2017  . MAMMOGRAM  12/25/2018    There are no preventive care reminders to display for this patient.  Lab Results  Component Value Date   TSH 1.190 04/22/2020   Lab Results  Component Value Date   WBC 7.0 04/22/2020   HGB 13.3 04/22/2020   HCT 39.8 04/22/2020   MCV 90 04/22/2020   PLT 216 04/22/2020   Lab Results  Component Value Date   NA 143 04/22/2020   K 4.2 04/22/2020   CO2 27 04/22/2020   GLUCOSE 84 04/22/2020   BUN 15 04/22/2020   CREATININE 0.73 04/22/2020   BILITOT 0.3 04/22/2020   ALKPHOS 78 04/22/2020   AST 18 04/22/2020   ALT 11 04/22/2020   PROT 7.2 04/22/2020   ALBUMIN 4.5 04/22/2020   CALCIUM 9.9 04/22/2020   Lab Results  Component Value Date   CHOL 256 (H) 04/22/2020   Lab Results  Component Value Date   HDL 61 04/22/2020   Lab Results  Component Value Date   LDLCALC 172 (H) 04/22/2020   Lab Results  Component Value Date   TRIG 128 04/22/2020   Lab Results  Component Value Date   CHOLHDL 4.2 04/22/2020   Lab Results  Component Value Date   HGBA1C 5.7 (H) 04/22/2020  Assessment & Plan:   1. Essential hypertension The current medical regimen is effective; blood pressure is stable at 116/67 today; continue present plan and medications as prescribed. She will continue to take medications as prescribed, to decrease high sodium intake, excessive alcohol intake, increase potassium intake, smoking cessation, and increase physical activity of at least 30 minutes of cardio activity daily. She will continue to follow Heart Healthy or DASH diet.  2. PTSD (post-traumatic stress disorder)  3. Neck pain - cyclobenzaprine  (FLEXERIL) 10 MG tablet; Take 1 tablet (10 mg total) by mouth 3 (three) times daily as needed for muscle spasms.  Dispense: 30 tablet; Refill: 3  4. Anxiety Stable today.   5. Healthcare maintenance - CBC with Differential - Comprehensive metabolic panel - TSH - Lipid Panel - Vitamin B12 - Vitamin D, 25-hydroxy - Hemoglobin A1c  6. Follow up She will follow up in 6 months.   Meds ordered this encounter  Medications  . cyclobenzaprine (FLEXERIL) 10 MG tablet    Sig: Take 1 tablet (10 mg total) by mouth 3 (three) times daily as needed for muscle spasms.    Dispense:  30 tablet    Refill:  3   Orders Placed This Encounter  Procedures  . CBC with Differential  . Comprehensive metabolic panel  . TSH  . Lipid Panel  . Vitamin B12  . Vitamin D, 25-hydroxy  . Hemoglobin A1c    Referral Orders  No referral(s) requested today    Kathe Becton,  MSN, FNP-BC [facility] [address] [place], [place] [address] [phone]- fax   Problem List Items Addressed This Visit      Cardiovascular and Mediastinum   Essential hypertension - Primary     Other   Neck pain   Relevant Medications   cyclobenzaprine (FLEXERIL) 10 MG tablet   PTSD (post-traumatic stress disorder)    Other Visit Diagnoses    Anxiety       Healthcare maintenance       Relevant Orders   CBC with Differential (Completed)   Comprehensive metabolic panel (Completed)   TSH (Completed)   Lipid Panel (Completed)   Vitamin B12 (Completed)   Vitamin D, 25-hydroxy (Completed)   Hemoglobin A1c (Completed)   Follow up          Meds ordered this encounter  Medications  . cyclobenzaprine (FLEXERIL) 10 MG tablet    Sig: Take 1 tablet (10 mg total) by mouth 3 (three) times daily as needed for muscle spasms.    Dispense:  30 tablet    Refill:  3    Follow-up: No follow-ups on file.    Azzie Glatter, FNP

## 2020-04-23 LAB — CBC WITH DIFFERENTIAL/PLATELET
Basophils Absolute: 0 10*3/uL (ref 0.0–0.2)
Basos: 0 %
EOS (ABSOLUTE): 0.2 10*3/uL (ref 0.0–0.4)
Eos: 2 %
Hematocrit: 39.8 % (ref 34.0–46.6)
Hemoglobin: 13.3 g/dL (ref 11.1–15.9)
Immature Grans (Abs): 0 10*3/uL (ref 0.0–0.1)
Immature Granulocytes: 0 %
Lymphocytes Absolute: 2.1 10*3/uL (ref 0.7–3.1)
Lymphs: 30 %
MCH: 30.1 pg (ref 26.6–33.0)
MCHC: 33.4 g/dL (ref 31.5–35.7)
MCV: 90 fL (ref 79–97)
Monocytes Absolute: 0.4 10*3/uL (ref 0.1–0.9)
Monocytes: 5 %
Neutrophils Absolute: 4.3 10*3/uL (ref 1.4–7.0)
Neutrophils: 63 %
Platelets: 216 10*3/uL (ref 150–450)
RBC: 4.42 x10E6/uL (ref 3.77–5.28)
RDW: 12.6 % (ref 11.7–15.4)
WBC: 7 10*3/uL (ref 3.4–10.8)

## 2020-04-23 LAB — VITAMIN B12: Vitamin B-12: >2000 pg/mL — ABNORMAL HIGH (ref 232–1245)

## 2020-04-23 LAB — LIPID PANEL
Chol/HDL Ratio: 4.2 ratio (ref 0.0–4.4)
Cholesterol, Total: 256 mg/dL — ABNORMAL HIGH (ref 100–199)
HDL: 61 mg/dL (ref >39)
LDL Chol Calc (NIH): 172 mg/dL — ABNORMAL HIGH (ref 0–99)
Triglycerides: 128 mg/dL (ref 0–149)
VLDL Cholesterol Cal: 23 mg/dL (ref 5–40)

## 2020-04-23 LAB — COMPREHENSIVE METABOLIC PANEL
ALT: 11 IU/L (ref 0–32)
AST: 18 IU/L (ref 0–40)
Albumin/Globulin Ratio: 1.7 (ref 1.2–2.2)
Albumin: 4.5 g/dL (ref 3.8–4.8)
Alkaline Phosphatase: 78 IU/L (ref 48–121)
BUN/Creatinine Ratio: 21 (ref 12–28)
BUN: 15 mg/dL (ref 8–27)
Bilirubin Total: 0.3 mg/dL (ref 0.0–1.2)
CO2: 27 mmol/L (ref 20–29)
Calcium: 9.9 mg/dL (ref 8.7–10.3)
Chloride: 105 mmol/L (ref 96–106)
Creatinine, Ser: 0.73 mg/dL (ref 0.57–1.00)
GFR calc Af Amer: 103 mL/min/{1.73_m2} (ref >59)
GFR calc non Af Amer: 89 mL/min/{1.73_m2} (ref >59)
Globulin, Total: 2.7 g/dL (ref 1.5–4.5)
Glucose: 84 mg/dL (ref 65–99)
Potassium: 4.2 mmol/L (ref 3.5–5.2)
Sodium: 143 mmol/L (ref 134–144)
Total Protein: 7.2 g/dL (ref 6.0–8.5)

## 2020-04-23 LAB — HEMOGLOBIN A1C
Est. average glucose Bld gHb Est-mCnc: 117 mg/dL
Hgb A1c MFr Bld: 5.7 % — ABNORMAL HIGH (ref 4.8–5.6)

## 2020-04-23 LAB — VITAMIN D 25 HYDROXY (VIT D DEFICIENCY, FRACTURES): Vit D, 25-Hydroxy: 41.6 ng/mL (ref 30.0–100.0)

## 2020-04-23 LAB — TSH: TSH: 1.19 u[IU]/mL (ref 0.450–4.500)

## 2020-04-24 ENCOUNTER — Encounter: Payer: Self-pay | Admitting: Family Medicine

## 2020-04-24 NOTE — Progress Notes (Signed)
Patient notified of results, verbally understood. No additional questions.

## 2020-04-29 ENCOUNTER — Telehealth: Payer: Self-pay | Admitting: Family Medicine

## 2020-04-29 NOTE — Telephone Encounter (Signed)
Spoke with patient, labs faxed to [facility] [phone].

## 2020-04-29 NOTE — Telephone Encounter (Signed)
Pt called wanting to know if they can get a copy of test results or have them sent to [facility]. Please follow up with pt.

## 2020-05-05 ENCOUNTER — Ambulatory Visit: Payer: BC Managed Care – PPO | Admitting: Psychology

## 2020-05-08 ENCOUNTER — Encounter: Payer: Self-pay | Admitting: Family Medicine

## 2020-05-08 ENCOUNTER — Other Ambulatory Visit: Payer: Self-pay

## 2020-05-08 ENCOUNTER — Ambulatory Visit (INDEPENDENT_AMBULATORY_CARE_PROVIDER_SITE_OTHER): Payer: BC Managed Care – PPO | Admitting: Family Medicine

## 2020-05-08 DIAGNOSIS — F4310 Post-traumatic stress disorder, unspecified: Secondary | ICD-10-CM

## 2020-05-08 NOTE — Progress Notes (Signed)
Rebecca [name] - 64 y.o. female MRN [id number]  Date of birth: 1956-03-19  SUBJECTIVE:  Including CC & ROS.  Chief Complaint  Patient presents with  . Follow-up    Rebecca [name] is a 64 y.o. female that is following up for ongoing symptoms.  She does have some ongoing headaches.  She got some neck pain as well.  Has trouble sleeping from time to time.   Review of Systems See HPI   HISTORY: Past Medical, Surgical, Social, and Family History Reviewed & Updated per EMR.   Pertinent Historical Findings include:  Past Medical History:  Diagnosis Date  . Hyperlipidemia   . Neck pain   . Obesity   . Peritonsillar abscess 02/03/2017  . Thyroid disease    monitoring a nodule 1/9    Past Surgical History:  Procedure Laterality Date  . ABDOMINAL HYSTERECTOMY      Family History  Problem Relation Age of Onset  . Cancer Mother        pancreatic cancer  . Diabetes Mother   . Heart disease Mother        rheumatic heart disease  . Hyperlipidemia Mother   . COPD Sister   . Sleep apnea Sister   . Cancer Sister        Lung Cancer  . Cancer Brother        lung cancer  . Cancer Father        renal, bone  . Diabetes Father   . Hyperlipidemia Father   . Heart attack Maternal Grandfather   . Kidney disease Brother   . Colon cancer Maternal Aunt     Social History   Socioeconomic History  . Marital status: Single    Spouse name: N/A  . Number of children: 0  . Years of education: 18.5  . Highest education level: Not on file  Occupational History  . Occupation: Retired    Fish farm manager: Psychologist, sport and exercise    Comment: [employer] x 30 years  . 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 Smoking status: Never Smoker  . Smokeless tobacco: Never Used  Vaping Use  . Vaping Use: Never used  Substance and Sexual Activity  . Alcohol use: Yes    Alcohol/week: 5.0 - 7.0 standard drinks    Types: 5 - 7 Standard drinks or  equivalent per week    Comment: occ  . Drug use: No  . Sexual activity: Yes    Partners: Male    Birth control/protection: Surgical  Other Topics Concern  . Not on file  Social History Narrative   Close friend/significant other killed (GSW) 2012.   Retired after 30 years, and took another job (commutes to [place] 4 days/week).   Social Determinants of Health   Financial Resource Strain:   . Difficulty of Paying Living Expenses:   Food Insecurity:   . Worried About Charity fundraiser in the Last Year:   . Arboriculturist in the Last Year:   Transportation Needs:   . Film/video editor (Medical):   Marland Kitchen Lack of Transportation (Non-Medical):   Physical Activity:   . Days of Exercise per Week:   . Minutes of Exercise per Session:   Stress:   . Feeling of Stress :   Social Connections:   . Frequency of Communication with Friends and Family:   . Frequency of Social Gatherings with Friends and Family:   . Attends Religious  Services:   . Active Member of Clubs or Organizations:   . Attends Archivist Meetings:   Marland Kitchen Marital Status:   Intimate Partner Violence:   . Fear of Current or Ex-Partner:   . Emotionally Abused:   Marland Kitchen Physically Abused:   . Sexually Abused:      PHYSICAL EXAM:  VS: BP 129/77   Pulse 65   Ht 5\' 3"  (1.6 m)   Wt 198 lb (89.8 kg)   BMI 35.07 kg/m  Physical Exam Gen: NAD, alert, cooperative with exam, well-appearing    ASSESSMENT & PLAN:   PTSD (post-traumatic stress disorder) Has acute triggers every so often.  She is mindful of her emotions and has been working out more.  She has lost weight recently. -Counseled on supportive care.

## 2020-05-08 NOTE — Assessment & Plan Note (Signed)
Has acute triggers every so often.  She is mindful of her emotions and has been working out more.  She has lost weight recently. -Counseled on supportive care.

## 2020-06-03 ENCOUNTER — Ambulatory Visit (INDEPENDENT_AMBULATORY_CARE_PROVIDER_SITE_OTHER): Payer: BC Managed Care – PPO | Admitting: Psychology

## 2020-06-03 DIAGNOSIS — F4310 Post-traumatic stress disorder, unspecified: Secondary | ICD-10-CM

## 2020-06-10 ENCOUNTER — Ambulatory Visit (INDEPENDENT_AMBULATORY_CARE_PROVIDER_SITE_OTHER): Payer: BC Managed Care – PPO | Admitting: Psychology

## 2020-06-10 DIAGNOSIS — F4310 Post-traumatic stress disorder, unspecified: Secondary | ICD-10-CM

## 2020-06-12 ENCOUNTER — Other Ambulatory Visit: Payer: Self-pay

## 2020-06-12 ENCOUNTER — Ambulatory Visit (INDEPENDENT_AMBULATORY_CARE_PROVIDER_SITE_OTHER): Payer: BC Managed Care – PPO | Admitting: Family Medicine

## 2020-06-12 ENCOUNTER — Encounter: Payer: Self-pay | Admitting: Family Medicine

## 2020-06-12 DIAGNOSIS — F4310 Post-traumatic stress disorder, unspecified: Secondary | ICD-10-CM | POA: Diagnosis not present

## 2020-06-12 NOTE — Progress Notes (Signed)
Rebecca [name] - 64 y.o. female MRN [id number]  Date of birth: [birth year]  SUBJECTIVE:  Including CC & ROS.  Chief Complaint  Patient presents with  . Follow-up    Rebecca [name] is a 64 y.o. female that is following up for PTSD.  She still has trouble sleeping and headaches..   Review of Systems See HPI   HISTORY: Past Medical, Surgical, Social, and Family History Reviewed & Updated per EMR.   Pertinent Historical Findings include:  Past Medical History:  Diagnosis Date  . Hyperlipidemia   . Neck pain   . Obesity   . Peritonsillar abscess 02/03/2017  . Thyroid disease    monitoring a nodule 1/9    Past Surgical History:  Procedure Laterality Date  . ABDOMINAL HYSTERECTOMY      Family History  Problem Relation Age of Onset  . Cancer Mother        pancreatic cancer  . Diabetes Mother   . Heart disease Mother        rheumatic heart disease  . Hyperlipidemia Mother   . COPD Sister   . Sleep apnea Sister   . Cancer Sister        Lung Cancer  . Cancer Brother        lung cancer  . Cancer Father        renal, bone  . Diabetes Father   . Hyperlipidemia Father   . Heart attack Maternal Grandfather   . Kidney disease Brother   . Colon cancer Maternal Aunt     Social History   Socioeconomic History  . Marital status: Single    Spouse name: N/A  . Number of children: 0  . Years of education: 18.5  . Highest education level: Not on file  Occupational History  . Occupation: Retired    Fish farm manager: Psychologist, sport and exercise    Comment: [employer] x 30 years  . 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 Smoking status: Never Smoker  . Smokeless tobacco: Never Used  Vaping Use  . Vaping Use: Never used  Substance and Sexual Activity  . Alcohol use: Yes    Alcohol/week: 5.0 - 7.0 standard drinks    Types: 5 - 7 Standard drinks or equivalent per week    Comment: occ  . Drug use: No  . Sexual activity: Yes     Partners: Male    Birth control/protection: Surgical  Other Topics Concern  . Not on file  Social History Narrative   Close friend/significant other killed (GSW) 2012.   Retired after 30 years, and took another job (commutes to [place] 4 days/week).   Social Determinants of Health   Financial Resource Strain:   . Difficulty of Paying Living Expenses:   Food Insecurity:   . Worried About Charity fundraiser in the Last Year:   . Arboriculturist in the Last Year:   Transportation Needs:   . Film/video editor (Medical):   Marland Kitchen Lack of Transportation (Non-Medical):   Physical Activity:   . Days of Exercise per Week:   . Minutes of Exercise per Session:   Stress:   . Feeling of Stress :   Social Connections:   . Frequency of Communication with Friends and Family:   . Frequency of Social Gatherings with Friends and Family:   . Attends Religious Services:   . Active Member of Clubs or Organizations:   . Attends Club or  Organization Meetings:   Marland Kitchen Marital Status:   Intimate Partner Violence:   . Fear of Current or Ex-Partner:   . Emotionally Abused:   Marland Kitchen Physically Abused:   . Sexually Abused:      PHYSICAL EXAM:  VS: Ht 5\' 3"  (1.6 m)   Wt 190 lb (86.2 kg)   BMI 33.66 kg/m  Physical Exam Gen: NAD, alert, cooperative with exam, well-appearing  ASSESSMENT & PLAN:   PTSD (post-traumatic stress disorder) Still having trouble sleeping and occasional headaches.  Stress has improved and has been able to see her psychologist. -Could consider a sleep medicine referral. -Follow-up 1 month.

## 2020-06-12 NOTE — Assessment & Plan Note (Signed)
Still having trouble sleeping and occasional headaches.  Stress has improved and has been able to see her psychologist. -Could consider a sleep medicine referral. -Follow-up 1 month.

## 2020-06-17 ENCOUNTER — Ambulatory Visit (INDEPENDENT_AMBULATORY_CARE_PROVIDER_SITE_OTHER): Payer: BC Managed Care – PPO | Admitting: Psychology

## 2020-06-17 DIAGNOSIS — F4310 Post-traumatic stress disorder, unspecified: Secondary | ICD-10-CM | POA: Diagnosis not present

## 2020-07-10 ENCOUNTER — Other Ambulatory Visit: Payer: Self-pay

## 2020-07-10 ENCOUNTER — Ambulatory Visit (INDEPENDENT_AMBULATORY_CARE_PROVIDER_SITE_OTHER): Payer: BC Managed Care – PPO | Admitting: Family Medicine

## 2020-07-10 ENCOUNTER — Encounter: Payer: Self-pay | Admitting: Family Medicine

## 2020-07-10 VITALS — Ht 63.0 in | Wt 195.0 lb

## 2020-07-10 DIAGNOSIS — F4310 Post-traumatic stress disorder, unspecified: Secondary | ICD-10-CM

## 2020-07-10 DIAGNOSIS — J040 Acute laryngitis: Secondary | ICD-10-CM | POA: Diagnosis not present

## 2020-07-10 DIAGNOSIS — K219 Gastro-esophageal reflux disease without esophagitis: Secondary | ICD-10-CM | POA: Insufficient documentation

## 2020-07-10 NOTE — Progress Notes (Signed)
Rebecca [name] - 64 y.o. female MRN [id number]  Date of birth: [birth year]  SUBJECTIVE:  Including CC & ROS.  Chief Complaint  Patient presents with  . Follow-up    Rebecca [name] is a 64 y.o. female that is following up for postconcussive symptoms and her PTSD.  She is also been dealing with laryngitis for over a month.  She has gotten his on about a yearly basis and has a history of vocal cord nodules.   Review of Systems See HPI   HISTORY: Past Medical, Surgical, Social, and Family History Reviewed & Updated per EMR.   Pertinent Historical Findings include:  Past Medical History:  Diagnosis Date  . Hyperlipidemia   . Neck pain   . Obesity   . Peritonsillar abscess 02/03/2017  . Thyroid disease    monitoring a nodule 1/9    Past Surgical History:  Procedure Laterality Date  . ABDOMINAL HYSTERECTOMY      Family History  Problem Relation Age of Onset  . Cancer Mother        pancreatic cancer  . Diabetes Mother   . Heart disease Mother        rheumatic heart disease  . Hyperlipidemia Mother   . COPD Sister   . Sleep apnea Sister   . Cancer Sister        Lung Cancer  . Cancer Brother        lung cancer  . Cancer Father        renal, bone  . Diabetes Father   . Hyperlipidemia Father   . Heart attack Maternal Grandfather   . Kidney disease Brother   . Colon cancer Maternal Aunt     Social History   Socioeconomic History  . Marital status: Single    Spouse name: N/A  . Number of children: 0  . Years of education: 18.5  . Highest education level: Not on file  Occupational History  . Occupation: Retired    Fish farm manager: Psychologist, sport and exercise    Comment: [employer] x 30 years  . 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 Smoking status: Never Smoker  . Smokeless tobacco: Never Used  Vaping Use  . Vaping Use: Never used  Substance and Sexual Activity  . Alcohol use: Yes    Alcohol/week: 5.0 - 7.0  standard drinks    Types: 5 - 7 Standard drinks or equivalent per week    Comment: occ  . Drug use: No  . Sexual activity: Yes    Partners: Male    Birth control/protection: Surgical  Other Topics Concern  . Not on file  Social History Narrative   Close friend/significant other killed (GSW) 2012.   Retired after 30 years, and took another job (commutes to [place] 4 days/week).   Social Determinants of Health   Financial Resource Strain:   . Difficulty of Paying Living Expenses: Not on file  Food Insecurity:   . Worried About Charity fundraiser in the Last Year: Not on file  . Ran Out of Food in the Last Year: Not on file  Transportation Needs:   . Lack of Transportation (Medical): Not on file  . Lack of Transportation (Non-Medical): Not on file  Physical Activity:   . Days of Exercise per Week: Not on file  . Minutes of Exercise per Session: Not on file  Stress:   . Feeling of Stress : Not on file  Social Connections:   .  Frequency of Communication with Friends and Family: Not on file  . Frequency of Social Gatherings with Friends and Family: Not on file  . Attends Religious Services: Not on file  . Active Member of Clubs or Organizations: Not on file  . Attends Archivist Meetings: Not on file  . Marital Status: Not on file  Intimate Partner Violence:   . Fear of Current or Ex-Partner: Not on file  . Emotionally Abused: Not on file  . Physically Abused: Not on file  . Sexually Abused: Not on file     PHYSICAL EXAM:  VS: Ht 5\' 3"  (1.6 m)   Wt 195 lb (88.5 kg)   BMI 34.54 kg/m  Physical Exam Gen: NAD, alert, cooperative with exam, well-appearing    ASSESSMENT & PLAN:   PTSD (post-traumatic stress disorder) Still has trouble sleeping and having headaches.  Laryngitis Symptoms are currently going on for about a month.  Has a history of peritonsillar abscess.  Has history of recurrent laryngitis. -Referral ENT.

## 2020-07-10 NOTE — Assessment & Plan Note (Signed)
Symptoms are currently going on for about a month.  Has a history of peritonsillar abscess.  Has history of recurrent laryngitis. -Referral ENT.

## 2020-07-10 NOTE — Assessment & Plan Note (Signed)
Still has trouble sleeping and having headaches.

## 2020-07-15 ENCOUNTER — Ambulatory Visit (INDEPENDENT_AMBULATORY_CARE_PROVIDER_SITE_OTHER): Payer: BC Managed Care – PPO | Admitting: Psychology

## 2020-07-15 DIAGNOSIS — F4310 Post-traumatic stress disorder, unspecified: Secondary | ICD-10-CM

## 2020-07-17 ENCOUNTER — Ambulatory Visit: Payer: BC Managed Care – PPO | Admitting: Family Medicine

## 2020-07-28 ENCOUNTER — Telehealth: Payer: Self-pay | Admitting: Family Medicine

## 2020-07-28 NOTE — Telephone Encounter (Signed)
Informed paperwork will be done today.   Rosemarie Ax, MD [facility] 07/28/2020, 1:59 PM

## 2020-07-28 NOTE — Telephone Encounter (Signed)
Patient called while office closed for lunch to check status of Disability forms, states is on a time limit to get documents into agency .  --forwarding message to provider.  --glh

## 2020-07-29 ENCOUNTER — Ambulatory Visit: Payer: BC Managed Care – PPO | Admitting: Psychology

## 2020-08-14 ENCOUNTER — Ambulatory Visit (INDEPENDENT_AMBULATORY_CARE_PROVIDER_SITE_OTHER): Payer: BC Managed Care – PPO | Admitting: Family Medicine

## 2020-08-14 ENCOUNTER — Other Ambulatory Visit: Payer: Self-pay

## 2020-08-14 ENCOUNTER — Encounter: Payer: Self-pay | Admitting: Family Medicine

## 2020-08-14 DIAGNOSIS — F4310 Post-traumatic stress disorder, unspecified: Secondary | ICD-10-CM | POA: Diagnosis not present

## 2020-08-14 NOTE — Progress Notes (Signed)
Shital Crayton - 64 y.o. female MRN [id number]  Date of birth: 07-Jul-1956  SUBJECTIVE:  Including CC & ROS.  Chief Complaint  Patient presents with  . Follow-up    Jenalee Trevizo is a 64 y.o. female that is following up for her PTSD. Reports she has been doing well. Her headaches have been improving.    Review of Systems See HPI   HISTORY: Past Medical, Surgical, Social, and Family History Reviewed & Updated per EMR.   Pertinent Historical Findings include:  Past Medical History:  Diagnosis Date  . Hyperlipidemia   . Neck pain   . Obesity   . Peritonsillar abscess 02/03/2017  . Thyroid disease    monitoring a nodule 1/9    Past Surgical History:  Procedure Laterality Date  . ABDOMINAL HYSTERECTOMY      Family History  Problem Relation Age of Onset  . Cancer Mother        pancreatic cancer  . Diabetes Mother   . Heart disease Mother        rheumatic heart disease  . Hyperlipidemia Mother   . COPD Sister   . Sleep apnea Sister   . Cancer Sister        Lung Cancer  . Cancer Brother        lung cancer  . Cancer Father        renal, bone  . Diabetes Father   . Hyperlipidemia Father   . Heart attack Maternal Grandfather   . Kidney disease Brother   . Colon cancer Maternal Aunt     Social History   Socioeconomic History  . Marital status: Single    Spouse name: N/A  . Number of children: 0  . Years of education: 18.5  . Highest education level: Not on file  Occupational History  . Occupation: Retired    Fish farm manager: Psychologist, sport and exercise    Comment: [employer] x 30 years  . 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 Smoking status: Never Smoker  . Smokeless tobacco: Never Used  Vaping Use  . Vaping Use: Never used  Substance and Sexual Activity  . Alcohol use: Yes    Alcohol/week: 5.0 - 7.0 standard drinks    Types: 5 - 7 Standard drinks or equivalent per week    Comment: occ  . Drug use: No   . Sexual activity: Yes    Partners: Male    Birth control/protection: Surgical  Other Topics Concern  . Not on file  Social History Narrative   Close friend/significant other killed (GSW) 2012.   Retired after 30 years, and took another job (commutes to [place] 4 days/week).   Social Determinants of Health   Financial Resource Strain:   . Difficulty of Paying Living Expenses: Not on file  Food Insecurity:   . Worried About Charity fundraiser in the Last Year: Not on file  . Ran Out of Food in the Last Year: Not on file  Transportation Needs:   . Lack of Transportation (Medical): Not on file  . Lack of Transportation (Non-Medical): Not on file  Physical Activity:   . Days of Exercise per Week: Not on file  . Minutes of Exercise per Session: Not on file  Stress:   . Feeling of Stress : Not on file  Social Connections:   . Frequency of Communication with Friends and Family: Not on file  . Frequency of Social Gatherings with Friends  and Family: Not on file  . Attends Religious Services: Not on file  . Active Member of Clubs or Organizations: Not on file  . Attends Archivist Meetings: Not on file  . Marital Status: Not on file  Intimate Partner Violence:   . Fear of Current or Ex-Partner: Not on file  . Emotionally Abused: Not on file  . Physically Abused: Not on file  . Sexually Abused: Not on file     PHYSICAL EXAM:  VS: BP (!) 151/70   Pulse 82   Ht 5\' 3"  (1.6 m)   Wt 193 lb (87.5 kg)   BMI 34.19 kg/m  Physical Exam Gen: NAD, alert, cooperative with exam, well-appearing     ASSESSMENT & PLAN:   PTSD (post-traumatic stress disorder) Has been doing well lately.  - has ongoing counseling.  - continue zoloft.

## 2020-08-14 NOTE — Assessment & Plan Note (Signed)
Has been doing well lately.  - has ongoing counseling.  - continue zoloft.

## 2020-09-11 ENCOUNTER — Encounter: Payer: Self-pay | Admitting: Family Medicine

## 2020-09-11 ENCOUNTER — Other Ambulatory Visit: Payer: Self-pay

## 2020-09-11 ENCOUNTER — Ambulatory Visit (INDEPENDENT_AMBULATORY_CARE_PROVIDER_SITE_OTHER): Payer: BC Managed Care – PPO | Admitting: Family Medicine

## 2020-09-11 DIAGNOSIS — F4310 Post-traumatic stress disorder, unspecified: Secondary | ICD-10-CM

## 2020-09-11 NOTE — Progress Notes (Signed)
Rebecca [name] - 64 y.o. female MRN [id number]  Date of birth: 1956/06/16  SUBJECTIVE:  Including CC & ROS.  Chief Complaint  Patient presents with  . Follow-up    Rebecca [name] is a 64 y.o. female that is following up for the PTSD.  She reports to having intermittent symptoms of anxiety and insomnia..   Review of Systems See HPI   HISTORY: Past Medical, Surgical, Social, and Family History Reviewed & Updated per EMR.   Pertinent Historical Findings include:  Past Medical History:  Diagnosis Date  . Hyperlipidemia   . Neck pain   . Obesity   . Peritonsillar abscess 02/03/2017  . Thyroid disease    monitoring a nodule 1/9    Past Surgical History:  Procedure Laterality Date  . ABDOMINAL HYSTERECTOMY      Family History  Problem Relation Age of Onset  . Cancer Mother        pancreatic cancer  . Diabetes Mother   . Heart disease Mother        rheumatic heart disease  . Hyperlipidemia Mother   . COPD Sister   . Sleep apnea Sister   . Cancer Sister        Lung Cancer  . Cancer Brother        lung cancer  . Cancer Father        renal, bone  . Diabetes Father   . Hyperlipidemia Father   . Heart attack Maternal Grandfather   . Kidney disease Brother   . Colon cancer Maternal Aunt     Social History   Socioeconomic History  . Marital status: Single    Spouse name: N/A  . Number of children: 0  . Years of education: 18.5  . Highest education level: Not on file  Occupational History  . Occupation: Retired    Fish farm manager: Psychologist, sport and exercise    Comment: [employer] x 30 years  . 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 Smoking status: Never Smoker  . Smokeless tobacco: Never Used  Vaping Use  . Vaping Use: Never used  Substance and Sexual Activity  . Alcohol use: Yes    Alcohol/week: 5.0 - 7.0 standard drinks    Types: 5 - 7 Standard drinks or equivalent per week    Comment: occ  . Drug use:  No  . Sexual activity: Yes    Partners: Male    Birth control/protection: Surgical  Other Topics Concern  . Not on file  Social History Narrative   Close friend/significant other killed (GSW) 2012.   Retired after 30 years, and took another job (commutes to [place] 4 days/week).   Social Determinants of Health   Financial Resource Strain:   . Difficulty of Paying Living Expenses: Not on file  Food Insecurity:   . Worried About Charity fundraiser in the Last Year: Not on file  . Ran Out of Food in the Last Year: Not on file  Transportation Needs:   . Lack of Transportation (Medical): Not on file  . Lack of Transportation (Non-Medical): Not on file  Physical Activity:   . Days of Exercise per Week: Not on file  . Minutes of Exercise per Session: Not on file  Stress:   . Feeling of Stress : Not on file  Social Connections:   . Frequency of Communication with Friends and Family: Not on file  . Frequency of Social Gatherings with Friends and  Family: Not on file  . Attends Religious Services: Not on file  . Active Member of Clubs or Organizations: Not on file  . Attends Archivist Meetings: Not on file  . Marital Status: Not on file  Intimate Partner Violence:   . Fear of Current or Ex-Partner: Not on file  . Emotionally Abused: Not on file  . Physically Abused: Not on file  . Sexually Abused: Not on file     PHYSICAL EXAM:  VS: BP (!) 145/80   Pulse 75   Ht 5\' 3"  (1.6 m)   Wt 190 lb (86.2 kg)   BMI 33.66 kg/m  Physical Exam Gen: NAD, alert, cooperative with exam, well-appearing    ASSESSMENT & PLAN:   PTSD (post-traumatic stress disorder) Symptoms are inconsistent and intermittent. -Counseled supportive care.

## 2020-09-11 NOTE — Assessment & Plan Note (Signed)
Symptoms are inconsistent and intermittent. -Counseled supportive care.

## 2020-10-22 ENCOUNTER — Ambulatory Visit: Payer: Self-pay | Admitting: Family Medicine

## 2020-10-23 ENCOUNTER — Ambulatory Visit: Payer: BC Managed Care – PPO | Admitting: Family Medicine

## 2020-11-13 ENCOUNTER — Other Ambulatory Visit: Payer: Self-pay

## 2020-11-13 ENCOUNTER — Ambulatory Visit (INDEPENDENT_AMBULATORY_CARE_PROVIDER_SITE_OTHER): Payer: 59 | Admitting: Family Medicine

## 2020-11-13 DIAGNOSIS — F4310 Post-traumatic stress disorder, unspecified: Secondary | ICD-10-CM | POA: Diagnosis not present

## 2020-11-13 NOTE — Progress Notes (Signed)
  Avonelle Viveros - 65 y.o. female MRN [id number]  Date of birth: 1956-10-19  SUBJECTIVE:  Including CC & ROS.  No chief complaint on file.   Kelechi Astarita is a 65 y.o. female that is following up for the pain clinic today.  She has not been seen by her psychologist in some time.  She is having trouble sleeping.   Review of Systems See HPI   HISTORY: Past Medical, Surgical, Social, and Family History Reviewed & Updated per EMR.   Pertinent Historical Findings include:  Past Medical History:  Diagnosis Date  . Hyperlipidemia   . Neck pain   . Obesity   . Peritonsillar abscess 02/03/2017  . Thyroid disease    monitoring a nodule 1/9    Past Surgical History:  Procedure Laterality Date  . ABDOMINAL HYSTERECTOMY      Family History  Problem Relation Age of Onset  . Cancer Mother        pancreatic cancer  . Diabetes Mother   . Heart disease Mother        rheumatic heart disease  . Hyperlipidemia Mother   . COPD Sister   . Sleep apnea Sister   . Cancer Sister        Lung Cancer  . Cancer Brother        lung cancer  . Cancer Father        renal, bone  . Diabetes Father   . Hyperlipidemia Father   . Heart attack Maternal Grandfather   . Kidney disease Brother   . Colon cancer Maternal Aunt     Social History   Socioeconomic History  . Marital status: Single    Spouse name: N/A  . Number of children: 0  . Years of education: 18.5  . Highest education level: Not on file  Occupational History  . Occupation: Retired    Associate Professor: Advice worker    Comment: [employer] x 30 years  . 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 Smoking status: Never Smoker  . Smokeless tobacco: Never Used  Vaping Use  . Vaping Use: Never used  Substance and Sexual Activity  . Alcohol use: Yes    Alcohol/week: 5.0 - 7.0 standard drinks    Types: 5 - 7 Standard drinks or equivalent per week    Comment: occ  . Drug  use: No  . Sexual activity: Yes    Partners: Male    Birth control/protection: Surgical  Other Topics Concern  . Not on file  Social History Narrative   Close friend/significant other killed (GSW) 2012.   Retired after 30 years, and took another job (commutes to [place] 4 days/week).   Social Determinants of Health   Financial Resource Strain: Not on file  Food Insecurity: Not on file  Transportation Needs: Not on file  Physical Activity: Not on file  Stress: Not on file  Social Connections: Not on file  Intimate Partner Violence: Not on file     PHYSICAL EXAM:  VS: BP 136/72   Ht 5\' 3"  (1.6 m)   Wt 203 lb (92.1 kg)   BMI 35.96 kg/m  Physical Exam Gen: NAD, alert, cooperative with exam, well-appearing   ASSESSMENT & PLAN:   PTSD (post-traumatic stress disorder) Seems more exacerbated as of late.  -Counseled supportive care. -Follow-up with psychologist. -Continue Zoloft.

## 2020-11-13 NOTE — Assessment & Plan Note (Signed)
Seems more exacerbated as of late.  -Counseled supportive care. -Follow-up with psychologist. -Continue Zoloft.

## 2020-11-18 ENCOUNTER — Encounter: Payer: Self-pay | Admitting: Family Medicine

## 2020-11-18 ENCOUNTER — Other Ambulatory Visit: Payer: Self-pay

## 2020-11-18 ENCOUNTER — Ambulatory Visit (INDEPENDENT_AMBULATORY_CARE_PROVIDER_SITE_OTHER): Payer: 59 | Admitting: Family Medicine

## 2020-11-18 VITALS — BP 136/73 | HR 84 | Temp 97.5°F | Resp 16 | Ht 63.0 in | Wt 211.8 lb

## 2020-11-18 DIAGNOSIS — F419 Anxiety disorder, unspecified: Secondary | ICD-10-CM | POA: Diagnosis not present

## 2020-11-18 DIAGNOSIS — I10 Essential (primary) hypertension: Secondary | ICD-10-CM | POA: Diagnosis not present

## 2020-11-18 DIAGNOSIS — F4310 Post-traumatic stress disorder, unspecified: Secondary | ICD-10-CM | POA: Diagnosis not present

## 2020-11-18 DIAGNOSIS — Z09 Encounter for follow-up examination after completed treatment for conditions other than malignant neoplasm: Secondary | ICD-10-CM

## 2020-11-18 DIAGNOSIS — R1314 Dysphagia, pharyngoesophageal phase: Secondary | ICD-10-CM | POA: Insufficient documentation

## 2020-11-18 DIAGNOSIS — F32A Depression, unspecified: Secondary | ICD-10-CM

## 2020-11-18 MED ORDER — TRAZODONE HCL 50 MG PO TABS: 25.0000 mg | ORAL_TABLET | Freq: Every evening | ORAL | 3 refills | Status: DC | PRN

## 2020-11-18 NOTE — Progress Notes (Signed)
[facility]   Established Patient Office Visit  Subjective:  Patient ID: Rebecca [name], female    DOB: 04/23/1956  Age: 65 y.o. MRN: [id number]  CC:  Chief Complaint  Patient presents with  . Medical Management of Chronic Issues    6 month follow up.  Marland Kitchen Health Maintenance    Discuss the need for Hepatitis C Screening, HIV Screening, Foot Exam, Eye Exam, Mammogram, Urine Microalbumin, Hemoglobin A1C, and Colonoscopy.  . Immunizations    Discuss the need for Influenza Vaccine, and PNA Vaccine,     HPI Rebecca [name] is a 65 year old female who presents for Follow Up today.    Patient Active Problem List   Diagnosis Date Noted  . Laryngitis 07/10/2020  . Chest pain 12/10/2019  . Essential hypertension 10/23/2019  . Cervical strain 07/24/2019  . History of thyroid nodule 07/24/2019  . Change in stool 07/24/2019  . PTSD (post-traumatic stress disorder) 09/06/2018  . Concussion with no loss of consciousness 03/17/2018  . Elevated blood pressure 07/16/2016  . Anxiety state 07/03/2016  . Neck pain 07/03/2016  . 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 Acid reflux disease 11/27/2014  . Special screening for malignant neoplasms, colon 09/17/2013  . Obesity, unspecified 01/16/2013  . Spinal stenosis in cervical region 01/11/2013  . Multiple thyroid nodules 01/11/2013  . Hand pain 06/16/2012  . Hyperlipidemia 06/16/2012   Current Status: Since her last office visit, she is doing well with no complaints. Her anxiety is moderate today, r/t her recent retirement. She continues to follow up with Psychiatrist as needed. She denies suicidal ideations, homicidal ideations, or auditory hallucinations. She denies visual changes, chest pain, cough, shortness of breath, heart palpitations, and falls. She has occasional headaches and dizziness with position changes. Denies severe headaches, confusion, seizures, double vision, and blurred vision, nausea  and vomiting. She denies fevers, chills, fatigue, recent infections, weight loss, and night sweats.  Denies GI problems such as diarrhea, and constipation. She has no reports of blood in stools, dysuria and hematuria. She is taking all medications as prescribed. She denies pain today.   Past Medical History:  Diagnosis Date  . Hyperlipidemia   . Neck pain   . Obesity   . Peritonsillar abscess 02/03/2017  . Thyroid disease    monitoring a nodule 1/9    Past Surgical History:  Procedure Laterality Date  . ABDOMINAL HYSTERECTOMY      Family History  Problem Relation Age of Onset  . Cancer Mother        pancreatic cancer  . Diabetes Mother   . Heart disease Mother        rheumatic heart disease  . Hyperlipidemia Mother   . COPD Sister   . Sleep apnea Sister   . Cancer Sister        Lung Cancer  . Cancer Brother        lung cancer  . Cancer Father        renal, bone  . Diabetes Father   . Hyperlipidemia Father   . Heart attack Maternal Grandfather   . Kidney disease Brother   . Colon cancer Maternal Aunt     Social History   Socioeconomic History  . Marital status: Single    Spouse name: N/A  . Number of children: 0  . Years of education: 18.5  . Highest education level: Not on file  Occupational History  . Occupation: Retired    Fish farm manager: [employer]    Comment: [employer] x 30 years  . 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 Smoking status: Never Smoker  . Smokeless tobacco: Never Used  Vaping Use  . Vaping Use: Never used  Substance and Sexual Activity  . Alcohol use: Yes    Alcohol/week: 5.0 - 7.0 standard drinks    Types: 5 - 7 Standard drinks or equivalent per week    Comment: occ  . Drug use: No  . Sexual activity: Yes    Partners: Male    Birth control/protection: Surgical  Other Topics Concern  . Not on file  Social History Narrative   Close friend/significant other killed  (GSW) 2012.   Retired after 30 years, and took another job (commutes to [place] 4 days/week).   Social Determinants of Health   Financial Resource Strain: Not on file  Food Insecurity: Not on file  Transportation Needs: Not on file  Physical Activity: Not on file  Stress: Not on file  Social Connections: Not on file  Intimate Partner Violence: Not on file    Outpatient Medications Prior to Visit  Medication Sig Dispense Refill  . acetaminophen (TYLENOL) 500 MG tablet Take 1 tablet (500 mg total) by mouth every 6 (six) hours as needed. 30 tablet 0  . cholecalciferol (VITAMIN D) 1000 units tablet Take 1,000 Units by mouth daily.    . cyclobenzaprine (FLEXERIL) 10 MG tablet Take 1 tablet (10 mg total) by mouth 3 (three) times daily as needed for muscle spasms. 30 tablet 3  . famotidine (PEPCID) 20 MG tablet Take 20 mg by mouth 2 (two) times daily.    Marland Kitchen ibuprofen (ADVIL,MOTRIN) 600 MG tablet Take 1 tablet (600 mg total) by mouth every 6 (six) hours as needed. 30 tablet 0  . Omega-3 Fatty Acids (FISH OIL) 1000 MG CAPS Take by mouth.    . phentermine 37.5 MG capsule Take 37.5 mg by mouth every morning.    . pyridOXINE (VITAMIN B-6) 100 MG tablet Take 100 mg by mouth daily.    . sertraline (ZOLOFT) 25 MG tablet Take 3 tablets (75 mg total) by mouth daily. 90 tablet 1  . traZODone (DESYREL) 50 MG tablet Take 0.5-1 tablets (25-50 mg total) by mouth at bedtime as needed for sleep. (Patient not taking: No sig reported) 30 tablet 3   No facility-administered medications prior to visit.    Allergies  Allergen Reactions  . Prednisolone Other (See Comments)    Patient can't remember  Other reaction(s): Delusions (intolerance) Patient can't remember     ROS Review of Systems  Constitutional: Negative.   HENT: Negative.   Eyes: Negative.   Respiratory: Negative.   Cardiovascular: Negative.   Gastrointestinal: Negative.   Endocrine: Negative.   Genitourinary: Negative.    Musculoskeletal: Positive for arthralgias (generalized).  Skin: Negative.   Allergic/Immunologic: Negative.   Neurological: Positive for dizziness (occasional ) and headaches (occasional ).  Hematological: Negative.   Psychiatric/Behavioral: Positive for sleep disturbance (insomnia). The patient is nervous/anxious.    Objective:    Physical Exam Vitals and nursing note reviewed.  Constitutional:      Appearance: Normal appearance.  HENT:     Head: Normocephalic and atraumatic.     Nose: Nose normal.     Mouth/Throat:     Mouth: Mucous membranes are moist.     Pharynx: Oropharynx is clear.  Cardiovascular:     Rate and Rhythm: Normal  rate and regular rhythm.     Pulses: Normal pulses.     Heart sounds: Normal heart sounds.  Pulmonary:     Effort: Pulmonary effort is normal.     Breath sounds: Normal breath sounds.  Abdominal:     General: Bowel sounds are normal.     Palpations: Abdomen is soft.  Musculoskeletal:        General: Normal range of motion.     Cervical back: Normal range of motion and neck supple.  Skin:    General: Skin is warm and dry.  Neurological:     General: No focal deficit present.     Mental Status: She is alert and oriented to person, place, and time.  Psychiatric:        Mood and Affect: Mood normal.        Behavior: Behavior normal.        Thought Content: Thought content normal.        Judgment: Judgment normal.    BP 136/73   Pulse 84   Temp (!) 97.5 F (36.4 C)   Resp 16   Ht 5\' 3"  (1.6 m)   Wt 211 lb 12.8 oz (96.1 kg)   SpO2 99%   BMI 37.52 kg/m  Wt Readings from Last 3 Encounters:  11/18/20 211 lb 12.8 oz (96.1 kg)  11/13/20 203 lb (92.1 kg)  09/11/20 190 lb (86.2 kg)     Health Maintenance Due  Topic Date Due  . Hepatitis C Screening  Never done  . PNEUMOCOCCAL POLYSACCHARIDE VACCINE AGE 45-64 HIGH RISK  Never done  . FOOT EXAM  Never done  . OPHTHALMOLOGY EXAM  Never done  . HIV Screening  Never done  . URINE  MICROALBUMIN  06/08/2017  . MAMMOGRAM  12/25/2018  . INFLUENZA VACCINE  06/08/2020  . HEMOGLOBIN A1C  10/22/2020    There are no preventive care reminders to display for this patient.  Lab Results  Component Value Date   TSH 1.190 04/22/2020   Lab Results  Component Value Date   WBC 7.0 04/22/2020   HGB 13.3 04/22/2020   HCT 39.8 04/22/2020   MCV 90 04/22/2020   PLT 216 04/22/2020   Lab Results  Component Value Date   NA 143 04/22/2020   K 4.2 04/22/2020   CO2 27 04/22/2020   GLUCOSE 84 04/22/2020   BUN 15 04/22/2020   CREATININE 0.73 04/22/2020   BILITOT 0.3 04/22/2020   ALKPHOS 78 04/22/2020   AST 18 04/22/2020   ALT 11 04/22/2020   PROT 7.2 04/22/2020   ALBUMIN 4.5 04/22/2020   CALCIUM 9.9 04/22/2020   Lab Results  Component Value Date   CHOL 256 (H) 04/22/2020   Lab Results  Component Value Date   HDL 61 04/22/2020   Lab Results  Component Value Date   LDLCALC 172 (H) 04/22/2020   Lab Results  Component Value Date   TRIG 128 04/22/2020   Lab Results  Component Value Date   CHOLHDL 4.2 04/22/2020   Lab Results  Component Value Date   HGBA1C 5.7 (H) 04/22/2020    Assessment & Plan:   1. Anxiety and depression - traZODone (DESYREL) 50 MG tablet; Take 0.5-1 tablets (25-50 mg total) by mouth at bedtime as needed for sleep.  Dispense: 90 tablet; Refill: 3  2. PTSD (post-traumatic stress disorder)  3. Essential hypertension The current medical regimen is effective; blood pressure is stable at 136/73 today; continue present plan and medications as prescribed.  She will continue to take medications as prescribed, to decrease high sodium intake, excessive alcohol intake, increase potassium intake, smoking cessation, and increase physical activity of at least 30 minutes of cardio activity daily. She will continue to follow Heart Healthy or DASH diet.  4. Follow up She will follow up in 04/2021 for Annual Physical and Labwork.   Current Outpatient  Medications on File Prior to Visit  Medication Sig Dispense Refill  . acetaminophen (TYLENOL) 500 MG tablet Take 1 tablet (500 mg total) by mouth every 6 (six) hours as needed. 30 tablet 0  . cholecalciferol (VITAMIN D) 1000 units tablet Take 1,000 Units by mouth daily.    . cyclobenzaprine (FLEXERIL) 10 MG tablet Take 1 tablet (10 mg total) by mouth 3 (three) times daily as needed for muscle spasms. 30 tablet 3  . famotidine (PEPCID) 20 MG tablet Take 20 mg by mouth 2 (two) times daily.    Marland Kitchen ibuprofen (ADVIL,MOTRIN) 600 MG tablet Take 1 tablet (600 mg total) by mouth every 6 (six) hours as needed. 30 tablet 0  . Omega-3 Fatty Acids (FISH OIL) 1000 MG CAPS Take by mouth.    . phentermine 37.5 MG capsule Take 37.5 mg by mouth every morning.    . pyridOXINE (VITAMIN B-6) 100 MG tablet Take 100 mg by mouth daily.    . sertraline (ZOLOFT) 25 MG tablet Take 3 tablets (75 mg total) by mouth daily. 90 tablet 1  . [DISCONTINUED] escitalopram (LEXAPRO) 10 MG tablet Take 1 tablet (10 mg total) by mouth daily. (Patient not taking: Reported on 08/19/2018) 30 tablet 0   No current facility-administered medications on file prior to visit.    No orders of the defined types were placed in this encounter.   Referral Orders  No referral(s) requested today    Kathe Becton, MSN, ANE, FNP-BC [facility] [place] [address] [place], [place] [address] [phone]- fax   Problem List Items Addressed This Visit      Cardiovascular and Mediastinum   Essential hypertension     Other   PTSD (post-traumatic stress disorder)   Relevant Medications   traZODone (DESYREL) 50 MG tablet    Other Visit Diagnoses    Anxiety and depression    -  Primary   Relevant Medications   traZODone (DESYREL) 50 MG tablet   Follow up          Meds ordered this encounter  Medications  . traZODone (DESYREL) 50 MG tablet     Sig: Take 0.5-1 tablets (25-50 mg total) by mouth at bedtime as needed for sleep.    Dispense:  90 tablet    Refill:  3    Follow-up: No follow-ups on file.    Azzie Glatter, FNP

## 2020-11-19 ENCOUNTER — Ambulatory Visit (INDEPENDENT_AMBULATORY_CARE_PROVIDER_SITE_OTHER): Payer: 59 | Admitting: Psychology

## 2020-11-19 ENCOUNTER — Other Ambulatory Visit: Payer: Self-pay | Admitting: Otolaryngology

## 2020-11-19 DIAGNOSIS — F4310 Post-traumatic stress disorder, unspecified: Secondary | ICD-10-CM

## 2020-11-20 ENCOUNTER — Other Ambulatory Visit: Payer: Self-pay | Admitting: Otolaryngology

## 2020-11-20 DIAGNOSIS — R1314 Dysphagia, pharyngoesophageal phase: Secondary | ICD-10-CM

## 2020-11-20 DIAGNOSIS — K219 Gastro-esophageal reflux disease without esophagitis: Secondary | ICD-10-CM

## 2020-11-25 ENCOUNTER — Other Ambulatory Visit: Payer: 59

## 2020-11-26 ENCOUNTER — Other Ambulatory Visit: Payer: Self-pay

## 2020-11-26 ENCOUNTER — Ambulatory Visit
Admission: RE | Admit: 2020-11-26 | Discharge: 2020-11-26 | Disposition: A | Discharge status: Home / Self Care | Payer: 59 | Source: Ambulatory Visit | Attending: Otolaryngology | Admitting: Otolaryngology

## 2020-11-26 DIAGNOSIS — K219 Gastro-esophageal reflux disease without esophagitis: Secondary | ICD-10-CM

## 2020-11-26 DIAGNOSIS — R1314 Dysphagia, pharyngoesophageal phase: Secondary | ICD-10-CM

## 2020-11-27 ENCOUNTER — Ambulatory Visit (INDEPENDENT_AMBULATORY_CARE_PROVIDER_SITE_OTHER): Payer: 59 | Admitting: Psychology

## 2020-11-27 DIAGNOSIS — F4310 Post-traumatic stress disorder, unspecified: Secondary | ICD-10-CM | POA: Diagnosis not present

## 2020-12-03 ENCOUNTER — Ambulatory Visit (INDEPENDENT_AMBULATORY_CARE_PROVIDER_SITE_OTHER): Payer: 59 | Admitting: Psychology

## 2020-12-03 DIAGNOSIS — F4310 Post-traumatic stress disorder, unspecified: Secondary | ICD-10-CM

## 2020-12-10 ENCOUNTER — Ambulatory Visit: Payer: 59 | Admitting: Psychology

## 2020-12-11 ENCOUNTER — Ambulatory Visit: Payer: 59 | Admitting: Family Medicine

## 2020-12-17 ENCOUNTER — Ambulatory Visit (INDEPENDENT_AMBULATORY_CARE_PROVIDER_SITE_OTHER): Payer: 59 | Admitting: Psychology

## 2020-12-17 DIAGNOSIS — F4310 Post-traumatic stress disorder, unspecified: Secondary | ICD-10-CM

## 2020-12-24 ENCOUNTER — Ambulatory Visit: Payer: 59 | Admitting: Psychology

## 2020-12-26 ENCOUNTER — Ambulatory Visit (INDEPENDENT_AMBULATORY_CARE_PROVIDER_SITE_OTHER): Payer: 59 | Admitting: Psychology

## 2020-12-26 DIAGNOSIS — F4310 Post-traumatic stress disorder, unspecified: Secondary | ICD-10-CM | POA: Diagnosis not present

## 2020-12-31 ENCOUNTER — Ambulatory Visit (INDEPENDENT_AMBULATORY_CARE_PROVIDER_SITE_OTHER): Payer: 59 | Admitting: Psychology

## 2020-12-31 DIAGNOSIS — F4310 Post-traumatic stress disorder, unspecified: Secondary | ICD-10-CM | POA: Diagnosis not present

## 2021-01-06 ENCOUNTER — Ambulatory Visit (INDEPENDENT_AMBULATORY_CARE_PROVIDER_SITE_OTHER): Payer: 59 | Admitting: Psychology

## 2021-01-06 DIAGNOSIS — F411 Generalized anxiety disorder: Secondary | ICD-10-CM

## 2021-01-14 ENCOUNTER — Ambulatory Visit (INDEPENDENT_AMBULATORY_CARE_PROVIDER_SITE_OTHER): Payer: 59 | Admitting: Psychology

## 2021-01-14 DIAGNOSIS — F4310 Post-traumatic stress disorder, unspecified: Secondary | ICD-10-CM | POA: Diagnosis not present

## 2021-01-15 ENCOUNTER — Ambulatory Visit: Payer: 59 | Admitting: Family Medicine

## 2021-01-15 ENCOUNTER — Other Ambulatory Visit: Payer: Self-pay

## 2021-01-15 DIAGNOSIS — F4310 Post-traumatic stress disorder, unspecified: Secondary | ICD-10-CM

## 2021-01-15 MED ORDER — SERTRALINE HCL 25 MG PO TABS: 75.0000 mg | ORAL_TABLET | Freq: Every day | ORAL | 1 refills | Status: DC

## 2021-01-15 NOTE — Progress Notes (Signed)
Rebecca [name] - 65 y.o. female MRN [id number]  Date of birth: 1956-09-18  SUBJECTIVE:  Including CC & ROS.  No chief complaint on file.   Rebecca [name] is a 65 y.o. female that is following up for PTSD and postconcussive symptoms.  She has had problems staying asleep.  She is able to fall asleep but ends up waking up.  Has to take trazodone to help with sleep.  Symptoms seem to be staying the same.  She does catnap through the course of the day.   Review of Systems See HPI   HISTORY: Past Medical, Surgical, Social, and Family History Reviewed & Updated per EMR.   Pertinent Historical Findings include:  Past Medical History:  Diagnosis Date  . Hyperlipidemia   . Neck pain   . Obesity   . Peritonsillar abscess 02/03/2017  . Thyroid disease    monitoring a nodule 1/9    Past Surgical History:  Procedure Laterality Date  . ABDOMINAL HYSTERECTOMY      Family History  Problem Relation Age of Onset  . Cancer Mother        pancreatic cancer  . Diabetes Mother   . Heart disease Mother        rheumatic heart disease  . Hyperlipidemia Mother   . COPD Sister   . Sleep apnea Sister   . Cancer Sister        Lung Cancer  . Cancer Brother        lung cancer  . Cancer Father        renal, bone  . Diabetes Father   . Hyperlipidemia Father   . Heart attack Maternal Grandfather   . Kidney disease Brother   . Colon cancer Maternal Aunt     Social History   Socioeconomic History  . Marital status: Single    Spouse name: N/A  . Number of children: 0  . Years of education: 18.5  . Highest education level: Not on file  Occupational History  . Occupation: Retired    Fish farm manager: Psychologist, sport and exercise    Comment: [employer] x 30 years  . 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 Smoking status: Never Smoker  . Smokeless tobacco: Never Used  Vaping Use  . Vaping Use: Never used  Substance and Sexual Activity  .  Alcohol use: Yes    Alcohol/week: 5.0 - 7.0 standard drinks    Types: 5 - 7 Standard drinks or equivalent per week    Comment: occ  . Drug use: No  . Sexual activity: Yes    Partners: Male    Birth control/protection: Surgical  Other Topics Concern  . Not on file  Social History Narrative   Close friend/significant other killed (GSW) 2012.   Retired after 30 years, and took another job (commutes to [place] 4 days/week).   Social Determinants of Health   Financial Resource Strain: Not on file  Food Insecurity: Not on file  Transportation Needs: Not on file  Physical Activity: Not on file  Stress: Not on file  Social Connections: Not on file  Intimate Partner Violence: Not on file     PHYSICAL EXAM:  VS: BP 122/88 (BP Location: Left Arm, Patient Position: Sitting, Cuff Size: Large)   Ht 5\' 3"  (1.6 m)   Wt 211 lb (95.7 kg)   BMI 37.38 kg/m  Physical Exam Gen: NAD, alert, cooperative with exam, well-appearing   ASSESSMENT & PLAN:  PTSD (post-traumatic stress disorder) Has had more instances of trouble sleeping here recently. -Counseled on supportive care. -Refilled Zoloft. -Could consider referral to sleep medicine.

## 2021-01-15 NOTE — Assessment & Plan Note (Signed)
Has had more instances of trouble sleeping here recently. -Counseled on supportive care. -Refilled Zoloft. -Could consider referral to sleep medicine.

## 2021-01-21 ENCOUNTER — Ambulatory Visit: Payer: 59 | Admitting: Psychology

## 2021-01-28 ENCOUNTER — Ambulatory Visit (INDEPENDENT_AMBULATORY_CARE_PROVIDER_SITE_OTHER): Payer: 59 | Admitting: Psychology

## 2021-01-28 DIAGNOSIS — F4310 Post-traumatic stress disorder, unspecified: Secondary | ICD-10-CM

## 2021-02-04 ENCOUNTER — Ambulatory Visit (INDEPENDENT_AMBULATORY_CARE_PROVIDER_SITE_OTHER): Payer: 59 | Admitting: Psychology

## 2021-02-04 DIAGNOSIS — F4310 Post-traumatic stress disorder, unspecified: Secondary | ICD-10-CM | POA: Diagnosis not present

## 2021-02-11 ENCOUNTER — Ambulatory Visit (INDEPENDENT_AMBULATORY_CARE_PROVIDER_SITE_OTHER): Payer: 59 | Admitting: Psychology

## 2021-02-11 DIAGNOSIS — F4310 Post-traumatic stress disorder, unspecified: Secondary | ICD-10-CM

## 2021-02-12 ENCOUNTER — Other Ambulatory Visit: Payer: Self-pay

## 2021-02-12 ENCOUNTER — Encounter: Payer: Self-pay | Admitting: Family Medicine

## 2021-02-12 ENCOUNTER — Ambulatory Visit: Payer: 59 | Admitting: Family Medicine

## 2021-02-12 DIAGNOSIS — F4310 Post-traumatic stress disorder, unspecified: Secondary | ICD-10-CM

## 2021-02-13 NOTE — Progress Notes (Signed)
Rebecca [name] - 65 y.o. female MRN [id number]  Date of birth: 15-Apr-1956  SUBJECTIVE:  Including CC & ROS.  No chief complaint on file.   Rebecca [name] is a 65 y.o. female that is following up for her PTSD.  She has still been having headaches and having trouble sleeping.     Review of Systems See HPI   HISTORY: Past Medical, Surgical, Social, and Family History Reviewed & Updated per EMR.   Pertinent Historical Findings include:  Past Medical History:  Diagnosis Date  . Hyperlipidemia   . Neck pain   . Obesity   . Peritonsillar abscess 02/03/2017  . Thyroid disease    monitoring a nodule 1/9    Past Surgical History:  Procedure Laterality Date  . ABDOMINAL HYSTERECTOMY      Family History  Problem Relation Age of Onset  . Cancer Mother        pancreatic cancer  . Diabetes Mother   . Heart disease Mother        rheumatic heart disease  . Hyperlipidemia Mother   . COPD Sister   . Sleep apnea Sister   . Cancer Sister        Lung Cancer  . Cancer Brother        lung cancer  . Cancer Father        renal, bone  . Diabetes Father   . Hyperlipidemia Father   . Heart attack Maternal Grandfather   . Kidney disease Brother   . Colon cancer Maternal Aunt     Social History   Socioeconomic History  . Marital status: Single    Spouse name: N/A  . Number of children: 0  . Years of education: 18.5  . Highest education level: Not on file  Occupational History  . Occupation: Retired    Fish farm manager: Psychologist, sport and exercise    Comment: [employer] x 30 years  . 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 Smoking status: Never Smoker  . Smokeless tobacco: Never Used  Vaping Use  . Vaping Use: Never used  Substance and Sexual Activity  . Alcohol use: Yes    Alcohol/week: 5.0 - 7.0 standard drinks    Types: 5 - 7 Standard drinks or equivalent per week    Comment: occ  . Drug use: No  . Sexual activity: Yes     Partners: Male    Birth control/protection: Surgical  Other Topics Concern  . Not on file  Social History Narrative   Close friend/significant other killed (GSW) 2012.   Retired after 30 years, and took another job (commutes to [place] 4 days/week).   Social Determinants of Health   Financial Resource Strain: Not on file  Food Insecurity: Not on file  Transportation Needs: Not on file  Physical Activity: Not on file  Stress: Not on file  Social Connections: Not on file  Intimate Partner Violence: Not on file     PHYSICAL EXAM:  VS: BP 134/82 (BP Location: Left Arm, Patient Position: Sitting, Cuff Size: Large)   Ht 5\' 3"  (1.6 m)   Wt 211 lb (95.7 kg)   BMI 37.38 kg/m  Physical Exam Gen: NAD, alert, cooperative with exam, well-appearing     ASSESSMENT & PLAN:   PTSD (post-traumatic stress disorder) Acute on chronic in nature.  She is still having fluctuations with her headaches and sleeping. -Counseled supportive care. -Continue with psychology. -Follow-up in 1 month.

## 2021-02-13 NOTE — Assessment & Plan Note (Signed)
Acute on chronic in nature.  She is still having fluctuations with her headaches and sleeping. -Counseled supportive care. -Continue with psychology. -Follow-up in 1 month.

## 2021-02-18 ENCOUNTER — Ambulatory Visit (INDEPENDENT_AMBULATORY_CARE_PROVIDER_SITE_OTHER): Payer: 59 | Admitting: Psychology

## 2021-02-18 DIAGNOSIS — F4310 Post-traumatic stress disorder, unspecified: Secondary | ICD-10-CM | POA: Diagnosis not present

## 2021-02-25 ENCOUNTER — Ambulatory Visit (INDEPENDENT_AMBULATORY_CARE_PROVIDER_SITE_OTHER): Payer: 59 | Admitting: Psychology

## 2021-02-25 DIAGNOSIS — F4310 Post-traumatic stress disorder, unspecified: Secondary | ICD-10-CM

## 2021-03-04 ENCOUNTER — Ambulatory Visit (INDEPENDENT_AMBULATORY_CARE_PROVIDER_SITE_OTHER): Payer: 59 | Admitting: Psychology

## 2021-03-04 DIAGNOSIS — F4310 Post-traumatic stress disorder, unspecified: Secondary | ICD-10-CM

## 2021-03-11 ENCOUNTER — Ambulatory Visit (INDEPENDENT_AMBULATORY_CARE_PROVIDER_SITE_OTHER): Payer: 59 | Admitting: Psychology

## 2021-03-11 DIAGNOSIS — F4310 Post-traumatic stress disorder, unspecified: Secondary | ICD-10-CM | POA: Diagnosis not present

## 2021-03-12 ENCOUNTER — Ambulatory Visit: Payer: Self-pay | Admitting: Family Medicine

## 2021-03-12 NOTE — Progress Notes (Deleted)
  Rebecca [name] - 65 y.o. female MRN [id number]  Date of birth: January 13, 1956  SUBJECTIVE:  Including CC & ROS.  No chief complaint on file.   Rebecca [name] is a 65 y.o. female that is  ***.  ***   Review of Systems See HPI   HISTORY: Past Medical, Surgical, Social, and Family History Reviewed & Updated per EMR.   Pertinent Historical Findings include:  Past Medical History:  Diagnosis Date  . Hyperlipidemia   . Neck pain   . Obesity   . Peritonsillar abscess 02/03/2017  . Thyroid disease    monitoring a nodule 1/9    Past Surgical History:  Procedure Laterality Date  . ABDOMINAL HYSTERECTOMY      Family History  Problem Relation Age of Onset  . Cancer Mother        pancreatic cancer  . Diabetes Mother   . Heart disease Mother        rheumatic heart disease  . Hyperlipidemia Mother   . COPD Sister   . Sleep apnea Sister   . Cancer Sister        Lung Cancer  . Cancer Brother        lung cancer  . Cancer Father        renal, bone  . Diabetes Father   . Hyperlipidemia Father   . Heart attack Maternal Grandfather   . Kidney disease Brother   . Colon cancer Maternal Aunt     Social History   Socioeconomic History  . Marital status: Single    Spouse name: N/A  . Number of children: 0  . Years of education: 18.5  . Highest education level: Not on file  Occupational History  . Occupation: Retired    Fish farm manager: Psychologist, sport and exercise    Comment: [employer] x 30 years  . 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 Smoking status: Never Smoker  . Smokeless tobacco: Never Used  Vaping Use  . Vaping Use: Never used  Substance and Sexual Activity  . Alcohol use: Yes    Alcohol/week: 5.0 - 7.0 standard drinks    Types: 5 - 7 Standard drinks or equivalent per week    Comment: occ  . Drug use: No  . Sexual activity: Yes    Partners: Male    Birth control/protection: Surgical  Other Topics Concern  .  Not on file  Social History Narrative   Close friend/significant other killed (GSW) 2012.   Retired after 30 years, and took another job (commutes to [place] 4 days/week).   Social Determinants of Health   Financial Resource Strain: Not on file  Food Insecurity: Not on file  Transportation Needs: Not on file  Physical Activity: Not on file  Stress: Not on file  Social Connections: Not on file  Intimate Partner Violence: Not on file     PHYSICAL EXAM:  VS: There were no vitals taken for this visit. Physical Exam Gen: NAD, alert, cooperative with exam, well-appearing MSK:  ***      ASSESSMENT & PLAN:   No problem-specific Assessment & Plan notes found for this encounter.

## 2021-03-17 ENCOUNTER — Ambulatory Visit: Payer: 59 | Admitting: Family Medicine

## 2021-03-17 ENCOUNTER — Other Ambulatory Visit: Payer: Self-pay

## 2021-03-17 ENCOUNTER — Encounter: Payer: Self-pay | Admitting: Family Medicine

## 2021-03-17 DIAGNOSIS — F4310 Post-traumatic stress disorder, unspecified: Secondary | ICD-10-CM | POA: Diagnosis not present

## 2021-03-17 NOTE — Assessment & Plan Note (Signed)
Having ongoing fogginess and lack of concentration. -Counseled on home exercise therapy and supportive care. -Counseled on taking Zoloft. -Could consider referral to psychiatry.

## 2021-03-17 NOTE — Progress Notes (Signed)
Rebecca [name] - 65 y.o. female MRN [id number]  Date of birth: October 02, 1956  SUBJECTIVE:  Including CC & ROS.  No chief complaint on file.   Rebecca [name] is a 65 y.o. female that is following up for her PTSD.  She has been taking her Zoloft intermittently.  Seems more a fog as of late.   Review of Systems See HPI   HISTORY: Past Medical, Surgical, Social, and Family History Reviewed & Updated per EMR.   Pertinent Historical Findings include:  Past Medical History:  Diagnosis Date  . Hyperlipidemia   . Neck pain   . Obesity   . Peritonsillar abscess 02/03/2017  . Thyroid disease    monitoring a nodule 1/9    Past Surgical History:  Procedure Laterality Date  . ABDOMINAL HYSTERECTOMY      Family History  Problem Relation Age of Onset  . Cancer Mother        pancreatic cancer  . Diabetes Mother   . Heart disease Mother        rheumatic heart disease  . Hyperlipidemia Mother   . COPD Sister   . Sleep apnea Sister   . Cancer Sister        Lung Cancer  . Cancer Brother        lung cancer  . Cancer Father        renal, bone  . Diabetes Father   . Hyperlipidemia Father   . Heart attack Maternal Grandfather   . Kidney disease Brother   . Colon cancer Maternal Aunt     Social History   Socioeconomic History  . Marital status: Single    Spouse name: N/A  . Number of children: 0  . Years of education: 18.5  . Highest education level: Not on file  Occupational History  . Occupation: Retired    Fish farm manager: Psychologist, sport and exercise    Comment: [employer] x 30 years  . 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 Smoking status: Never Smoker  . Smokeless tobacco: Never Used  Vaping Use  . Vaping Use: Never used  Substance and Sexual Activity  . Alcohol use: Yes    Alcohol/week: 5.0 - 7.0 standard drinks    Types: 5 - 7 Standard drinks or equivalent per week    Comment: occ  . Drug use: No  . Sexual  activity: Yes    Partners: Male    Birth control/protection: Surgical  Other Topics Concern  . Not on file  Social History Narrative   Close friend/significant other killed (GSW) 2012.   Retired after 30 years, and took another job (commutes to [place] 4 days/week).   Social Determinants of Health   Financial Resource Strain: Not on file  Food Insecurity: Not on file  Transportation Needs: Not on file  Physical Activity: Not on file  Stress: Not on file  Social Connections: Not on file  Intimate Partner Violence: Not on file     PHYSICAL EXAM:  VS: BP 130/80 (BP Location: Left Arm, Patient Position: Sitting, Cuff Size: Large)   Ht 5\' 3"  (1.6 m)   Wt 211 lb (95.7 kg)   BMI 37.38 kg/m  Physical Exam Gen: NAD, alert, cooperative with exam, well-appearing    ASSESSMENT & PLAN:   PTSD (post-traumatic stress disorder) Having ongoing fogginess and lack of concentration. -Counseled on home exercise therapy and supportive care. -Counseled on taking Zoloft. -Could consider referral to psychiatry.

## 2021-03-18 ENCOUNTER — Ambulatory Visit: Payer: 59 | Admitting: Psychology

## 2021-03-25 ENCOUNTER — Ambulatory Visit: Payer: 59 | Admitting: Psychology

## 2021-04-01 ENCOUNTER — Ambulatory Visit (INDEPENDENT_AMBULATORY_CARE_PROVIDER_SITE_OTHER): Payer: 59 | Admitting: Psychology

## 2021-04-01 DIAGNOSIS — F4310 Post-traumatic stress disorder, unspecified: Secondary | ICD-10-CM | POA: Diagnosis not present

## 2021-04-08 ENCOUNTER — Ambulatory Visit (INDEPENDENT_AMBULATORY_CARE_PROVIDER_SITE_OTHER): Payer: 59 | Admitting: Psychology

## 2021-04-08 DIAGNOSIS — F4310 Post-traumatic stress disorder, unspecified: Secondary | ICD-10-CM

## 2021-04-09 ENCOUNTER — Telehealth (INDEPENDENT_AMBULATORY_CARE_PROVIDER_SITE_OTHER): Payer: 59 | Admitting: Family Medicine

## 2021-04-09 ENCOUNTER — Other Ambulatory Visit: Payer: Self-pay

## 2021-04-09 DIAGNOSIS — F4310 Post-traumatic stress disorder, unspecified: Secondary | ICD-10-CM

## 2021-04-09 NOTE — Assessment & Plan Note (Signed)
Has been under more stress than usual with her friend with her recurrence of cancer.  She has been helping her in the meantime. -Counseled on supportive care

## 2021-04-09 NOTE — Progress Notes (Signed)
Virtual Visit via Telephone Note  I connected with Rebecca [name] on 04/09/21 at  1:50 PM EDT by telephone and verified that I am speaking with the correct person using two identifiers.  Location: Patient: friend's house Provider: office    I discussed the limitations, risks, security and privacy concerns of performing an evaluation and management service by telephone and the availability of in person appointments. I also discussed with the patient that there may be a patient responsible charge related to this service. The patient expressed understanding and agreed to proceed.   History of Present Illness:  Rebecca [name] is a 65 year old female is following up for her ongoing PTSD.  She had a recent psychological eval and medical screening.  That was a stressful event for her.  She also has a friend that was diagnosed with terminal cancer.  She is gone down to help her with her businesses and activities of daily living.   Observations/Objective:   Assessment and Plan:  PTSD:  Has been under more stress than usual with her friend with her recurrence of cancer.  She has been helping her in the meantime. -Counseled on supportive care  Follow Up Instructions:    I discussed the assessment and treatment plan with the patient. The patient was provided an opportunity to ask questions and all were answered. The patient agreed with the plan and demonstrated an understanding of the instructions.   The patient was advised to call back or seek an in-person evaluation if the symptoms worsen or if the condition fails to improve as anticipated.  I provided 12 minutes of non-face-to-face time during this encounter.   Clearance Coots, MD

## 2021-04-22 ENCOUNTER — Ambulatory Visit (INDEPENDENT_AMBULATORY_CARE_PROVIDER_SITE_OTHER): Payer: 59 | Admitting: Psychology

## 2021-04-22 DIAGNOSIS — F4310 Post-traumatic stress disorder, unspecified: Secondary | ICD-10-CM | POA: Diagnosis not present

## 2021-04-29 ENCOUNTER — Ambulatory Visit: Payer: 59 | Admitting: Psychology

## 2021-05-06 ENCOUNTER — Ambulatory Visit (INDEPENDENT_AMBULATORY_CARE_PROVIDER_SITE_OTHER): Payer: 59 | Admitting: Psychology

## 2021-05-06 DIAGNOSIS — F4310 Post-traumatic stress disorder, unspecified: Secondary | ICD-10-CM | POA: Diagnosis not present

## 2021-05-18 ENCOUNTER — Ambulatory Visit: Payer: Self-pay | Admitting: Nurse Practitioner

## 2021-05-18 ENCOUNTER — Encounter: Payer: Self-pay | Admitting: Family Medicine

## 2021-05-20 ENCOUNTER — Ambulatory Visit (INDEPENDENT_AMBULATORY_CARE_PROVIDER_SITE_OTHER): Payer: 59 | Admitting: Psychology

## 2021-05-20 DIAGNOSIS — F4310 Post-traumatic stress disorder, unspecified: Secondary | ICD-10-CM

## 2021-06-03 ENCOUNTER — Ambulatory Visit (INDEPENDENT_AMBULATORY_CARE_PROVIDER_SITE_OTHER): Payer: 59 | Admitting: Psychology

## 2021-06-03 DIAGNOSIS — F4310 Post-traumatic stress disorder, unspecified: Secondary | ICD-10-CM | POA: Diagnosis not present

## 2021-06-17 ENCOUNTER — Ambulatory Visit (INDEPENDENT_AMBULATORY_CARE_PROVIDER_SITE_OTHER): Payer: 59 | Admitting: Psychology

## 2021-06-17 DIAGNOSIS — F4310 Post-traumatic stress disorder, unspecified: Secondary | ICD-10-CM | POA: Diagnosis not present

## 2021-07-01 ENCOUNTER — Ambulatory Visit (INDEPENDENT_AMBULATORY_CARE_PROVIDER_SITE_OTHER): Payer: 59 | Admitting: Psychology

## 2021-07-01 DIAGNOSIS — F4310 Post-traumatic stress disorder, unspecified: Secondary | ICD-10-CM | POA: Diagnosis not present

## 2021-07-23 ENCOUNTER — Other Ambulatory Visit: Payer: Self-pay

## 2021-07-23 ENCOUNTER — Ambulatory Visit (INDEPENDENT_AMBULATORY_CARE_PROVIDER_SITE_OTHER): Payer: 59 | Admitting: Nurse Practitioner

## 2021-07-23 ENCOUNTER — Encounter: Payer: Self-pay | Admitting: Nurse Practitioner

## 2021-07-23 VITALS — BP 143/70 | HR 70 | Temp 97.2°F | Ht 63.0 in | Wt 218.0 lb

## 2021-07-23 DIAGNOSIS — R7303 Prediabetes: Secondary | ICD-10-CM

## 2021-07-23 DIAGNOSIS — M542 Cervicalgia: Secondary | ICD-10-CM

## 2021-07-23 DIAGNOSIS — Z1231 Encounter for screening mammogram for malignant neoplasm of breast: Secondary | ICD-10-CM | POA: Diagnosis not present

## 2021-07-23 DIAGNOSIS — E669 Obesity, unspecified: Secondary | ICD-10-CM | POA: Diagnosis not present

## 2021-07-23 MED ORDER — CYCLOBENZAPRINE HCL 10 MG PO TABS: 10.0000 mg | ORAL_TABLET | Freq: Three times a day (TID) | ORAL | 3 refills | Status: DC | PRN

## 2021-07-23 NOTE — Patient Instructions (Signed)
Health Maintenance, Female Adopting a healthy lifestyle and getting preventive care are important in promoting health and wellness. Ask your health care provider about: The right schedule for you to have regular tests and exams. Things you can do on your own to prevent diseases and keep yourself healthy. What should I know about diet, weight, and exercise? Eat a healthy diet  Eat a diet that includes plenty of vegetables, fruits, low-fat dairy products, and lean protein. Do not eat a lot of foods that are high in solid fats, added sugars, or sodium. Maintain a healthy weight Body mass index (BMI) is used to identify weight problems. It estimates body fat based on height and weight. Your health care provider can help determine your BMI and help you achieve or maintain a healthy weight. Get regular exercise Get regular exercise. This is one of the most important things you can do for your health. Most adults should: Exercise for at least 150 minutes each week. The exercise should increase your heart rate and make you sweat (moderate-intensity exercise). Do strengthening exercises at least twice a week. This is in addition to the moderate-intensity exercise. Spend less time sitting. Even light physical activity can be beneficial. Watch cholesterol and blood lipids Have your blood tested for lipids and cholesterol at 63 years of age, then have this test every 5 years. Have your cholesterol levels checked more often if: Your lipid or cholesterol levels are high. You are older than 63 years of age. You are at high risk for heart disease. What should I know about cancer screening? Depending on your health history and family history, you may need to have cancer screening at various ages. This may include screening for: Breast cancer. Cervical cancer. Colorectal cancer. Skin cancer. Lung cancer. What should I know about heart disease, diabetes, and high blood pressure? Blood pressure and heart  disease High blood pressure causes heart disease and increases the risk of stroke. This is more likely to develop in people who have high blood pressure readings, are of African descent, or are overweight. Have your blood pressure checked: Every 3-5 years if you are 18-39 years of age. Every year if you are 40 years old or older. Diabetes Have regular diabetes screenings. This checks your fasting blood sugar level. Have the screening done: Once every three years after age 40 if you are at a normal weight and have a low risk for diabetes. More often and at a younger age if you are overweight or have a high risk for diabetes. What should I know about preventing infection? Hepatitis B If you have a higher risk for hepatitis B, you should be screened for this virus. Talk with your health care provider to find out if you are at risk for hepatitis B infection. Hepatitis C Testing is recommended for: Everyone born from 1945 through 1965. Anyone with known risk factors for hepatitis C. Sexually transmitted infections (STIs) Get screened for STIs, including gonorrhea and chlamydia, if: You are sexually active and are younger than 63 years of age. You are older than 63 years of age and your health care provider tells you that you are at risk for this type of infection. Your sexual activity has changed since you were last screened, and you are at increased risk for chlamydia or gonorrhea. Ask your health care provider if you are at risk. Ask your health care provider about whether you are at high risk for HIV. Your health care provider may recommend a prescription medicine   to help prevent HIV infection. If you choose to take medicine to prevent HIV, you should first get tested for HIV. You should then be tested every 3 months for as long as you are taking the medicine. Pregnancy If you are about to stop having your period (premenopausal) and you may become pregnant, seek counseling before you get  pregnant. Take 400 to 800 micrograms (mcg) of folic acid every day if you become pregnant. Ask for birth control (contraception) if you want to prevent pregnancy. Osteoporosis and menopause Osteoporosis is a disease in which the bones lose minerals and strength with aging. This can result in bone fractures. If you are 65 years old or older, or if you are at risk for osteoporosis and fractures, ask your health care provider if you should: Be screened for bone loss. Take a calcium or vitamin D supplement to lower your risk of fractures. Be given hormone replacement therapy (HRT) to treat symptoms of menopause. Follow these instructions at home: Lifestyle Do not use any products that contain nicotine or tobacco, such as cigarettes, e-cigarettes, and chewing tobacco. If you need help quitting, ask your health care provider. Do not use street drugs. Do not share needles. Ask your health care provider for help if you need support or information about quitting drugs. Alcohol use Do not drink alcohol if: Your health care provider tells you not to drink. You are pregnant, may be pregnant, or are planning to become pregnant. If you drink alcohol: Limit how much you use to 0-1 drink a day. Limit intake if you are breastfeeding. Be aware of how much alcohol is in your drink. In the [place] one drink equals one 12 oz bottle of beer (355 mL), one 5 oz glass of wine (148 mL), or one 1 oz glass of hard liquor (44 mL). General instructions Schedule regular health, dental, and eye exams. Stay current with your vaccines. Tell your health care provider if: You often feel depressed. You have ever been abused or do not feel safe at home. Summary Adopting a healthy lifestyle and getting preventive care are important in promoting health and wellness. Follow your health care provider's instructions about healthy diet, exercising, and getting tested or screened for diseases. Follow your health care provider's  instructions on monitoring your cholesterol and blood pressure. This information is not intended to replace advice given to you by your health care provider. Make sure you discuss any questions you have with your health care provider. Document Revised: 01/02/2021 Document Reviewed: 10/18/2018 Elsevier Patient Education  2022 [employer]  
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
## 2021-07-24 LAB — MICROALBUMIN, URINE: Microalbumin, Urine: 120 ug/mL

## 2021-08-06 ENCOUNTER — Encounter: Payer: Self-pay | Admitting: Family Medicine

## 2021-08-06 ENCOUNTER — Ambulatory Visit (INDEPENDENT_AMBULATORY_CARE_PROVIDER_SITE_OTHER): Payer: Medicare Other | Admitting: Family Medicine

## 2021-08-06 ENCOUNTER — Other Ambulatory Visit: Payer: Self-pay

## 2021-08-06 DIAGNOSIS — F4310 Post-traumatic stress disorder, unspecified: Secondary | ICD-10-CM | POA: Diagnosis not present

## 2021-08-06 MED ORDER — SERTRALINE HCL 25 MG PO TABS: 75.0000 mg | ORAL_TABLET | Freq: Every day | ORAL | 1 refills | Status: DC

## 2021-08-06 NOTE — Assessment & Plan Note (Signed)
Symptoms are intermittent in nature but she has had clarification on her disability ruling.  Is still trying to get established with a new psychologist. -Counseled on home exercise therapy and supportive care. -Refilled Zoloft.
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
## 2021-10-15 ENCOUNTER — Ambulatory Visit: Payer: Medicare Other | Admitting: Family Medicine

## 2021-10-22 ENCOUNTER — Ambulatory Visit (INDEPENDENT_AMBULATORY_CARE_PROVIDER_SITE_OTHER): Payer: Medicare Other | Admitting: Family Medicine

## 2021-10-22 ENCOUNTER — Encounter: Payer: Self-pay | Admitting: Family Medicine

## 2021-10-22 DIAGNOSIS — F4310 Post-traumatic stress disorder, unspecified: Secondary | ICD-10-CM | POA: Diagnosis not present

## 2021-10-22 MED ORDER — SERTRALINE HCL 25 MG PO TABS: 75.0000 mg | ORAL_TABLET | Freq: Every day | ORAL | 1 refills | Status: DC

## 2021-10-22 NOTE — Assessment & Plan Note (Signed)
Acute on chronic in nature.  Has been dealing with the loss of her close friend. -Counseled on supportive care. -Refilled Zoloft. -May consider reestablishing with psychology.
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
## 2021-12-28 IMAGING — RF DG ESOPHAGUS
6 series · 14 of 20 positions shown · non-contrast
Comparison: None.

CLINICAL DATA: Reflux disease.  Pharyngoesophageal dysphagia

EXAM:
ESOPHOGRAM / BARIUM SWALLOW / BARIUM TABLET STUDY
TECHNIQUE: Combined double contrast and single contrast examination performed
using effervescent crystals, thick barium liquid, and thin barium
liquid. The patient was observed with fluoroscopy swallowing a 13 mm
barium sulphate tablet.
FLUOROSCOPY TIME:  Fluoroscopy Time:  54 seconds
Radiation Exposure Index (if provided by the fluoroscopic device):
113 mGy
Number of Acquired Spot Images: 0

[Series 1: one shot · 3 of 5 slices shown (1 of 3)]
[im 1/5]
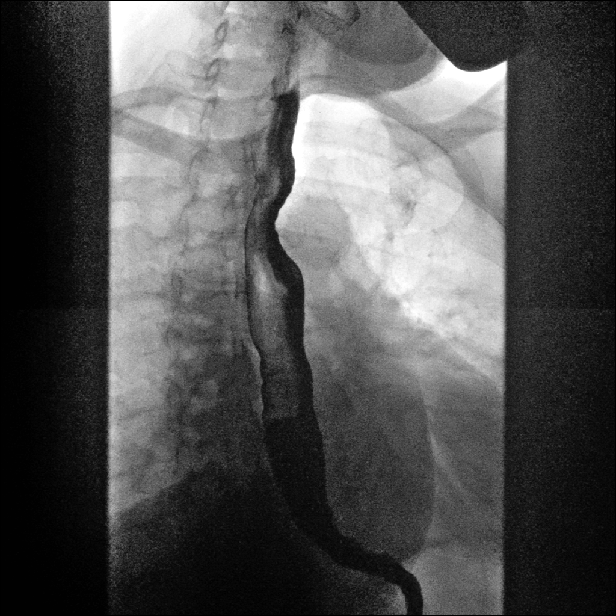
[im 3/5]
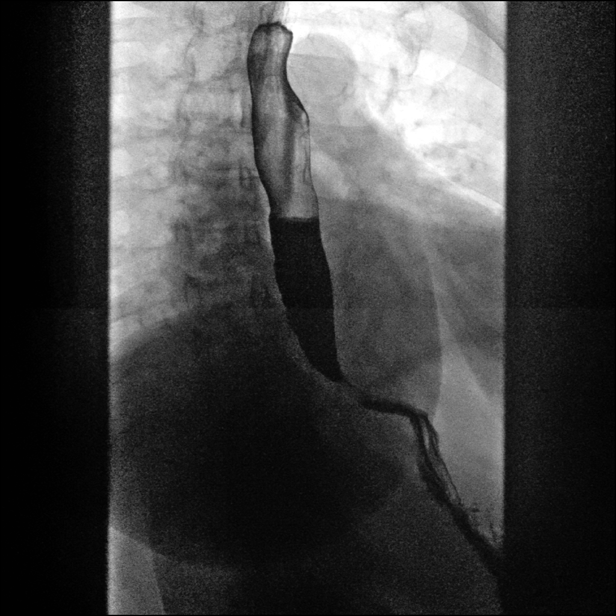
[im 4/5]
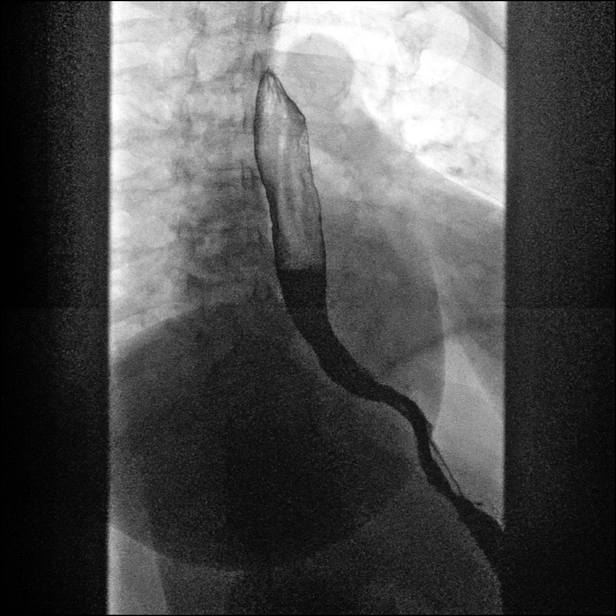

[Series 2: sequence · 3 of 66 frames shown (1 of 3)]
[frame 10/66]
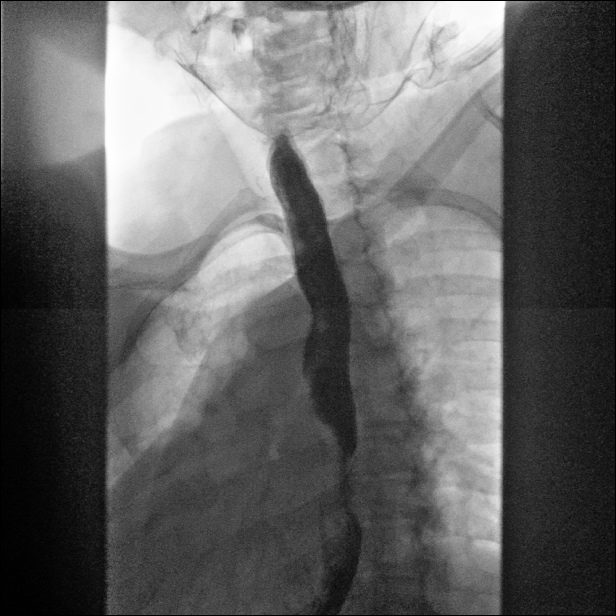
[frame 34/66]
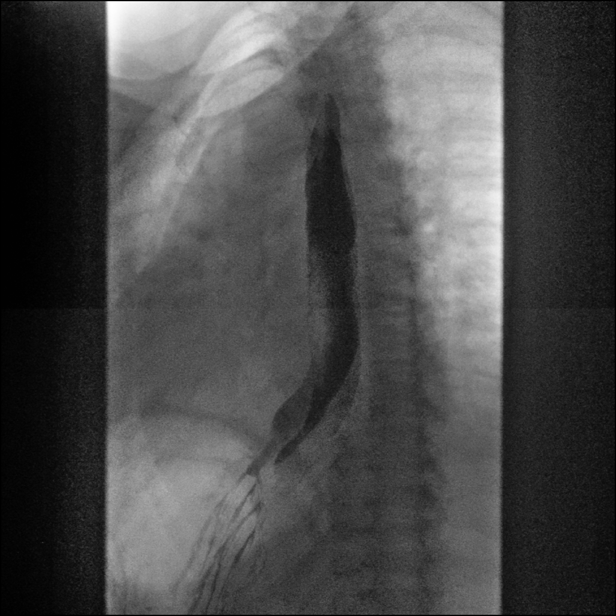
[frame 36/66]
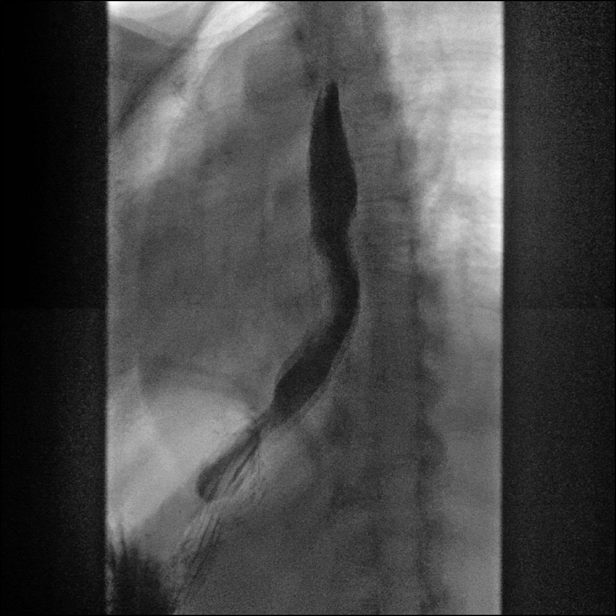

[Series 3: one shot · 1 of 1 slices shown (2 of 3)]
[im 1/1]
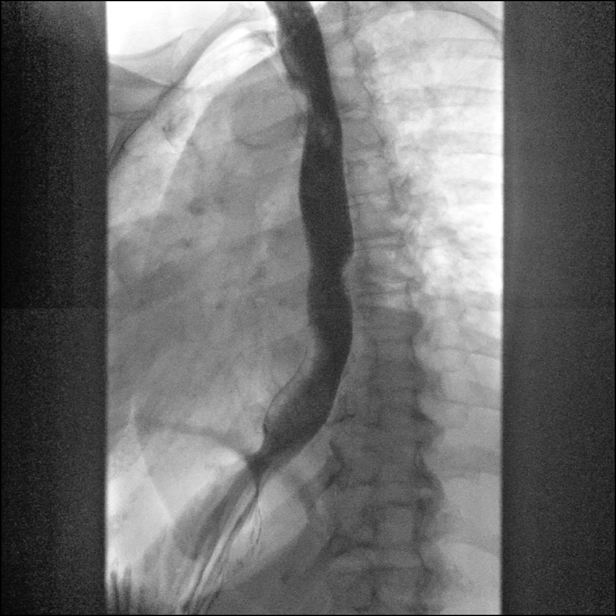

[Series 4: sequence · 3 of 17 frames shown (2 of 3)]
[frame 3/17]
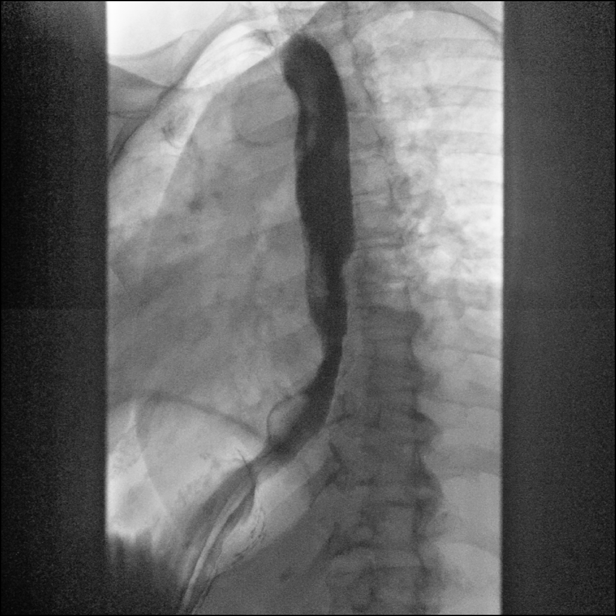
[frame 9/17]
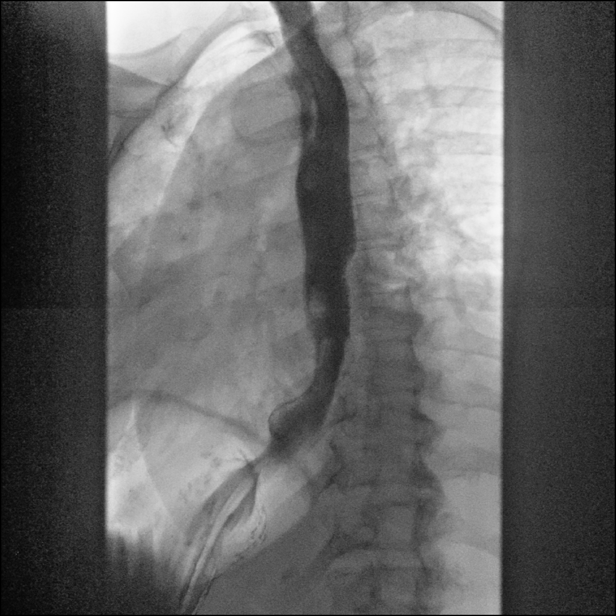
[frame 15/17]
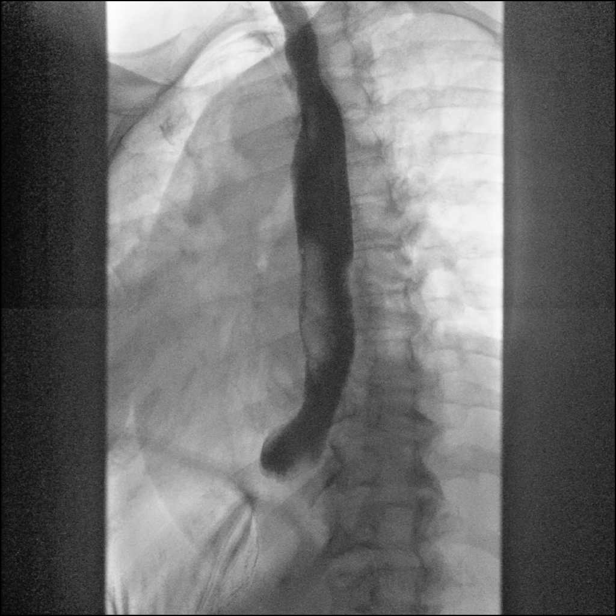

[Series 5: one shot · 1 of 2 slices shown (3 of 3)]
[im 2/2]
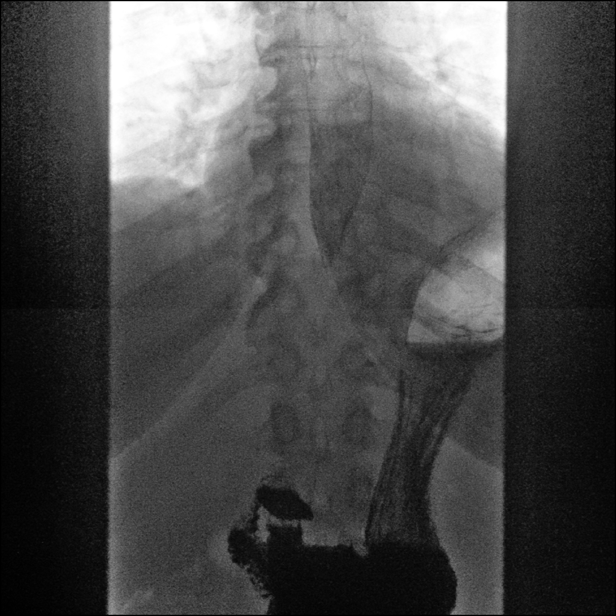

[Series 6: sequence · 3 of 26 frames shown (3 of 3)]
[frame 4/26]
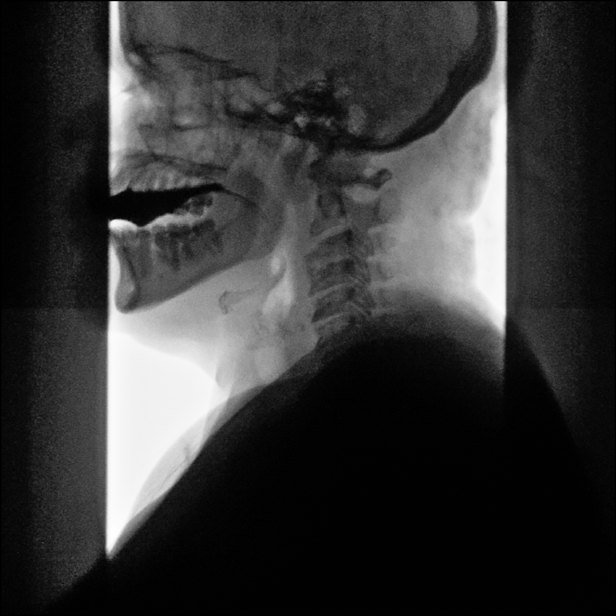
[frame 14/26]
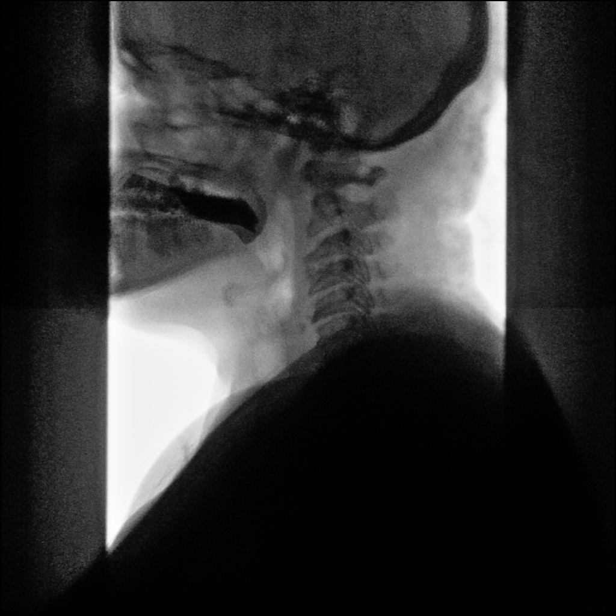
[frame 23/26]
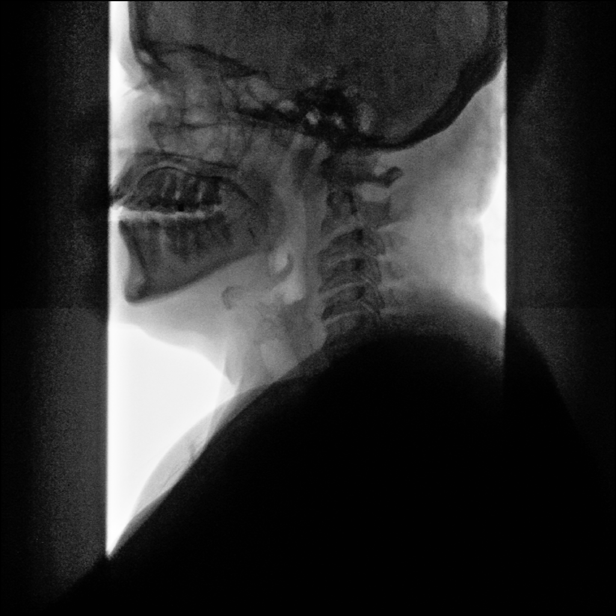

[14 of 20 positions shown; findings below may reference images not displayed]

FINDINGS: The esophagus is patent. No stricture or mass identified. A 13 mm
barium tablet was ingested which easily passed through the esophagus
and into the stomach.

Mild esophageal dysmotility noted with several non propulsive
tertiary contractions.

No hiatal hernia or reflux identified.

The swallowing mechanism appears normal.  No aspiration identified.
IMPRESSION: 1. Patent esophagus without findings of stricture or mass.
2. Mild esophageal dysmotility.

## 2022-01-13 ENCOUNTER — Encounter: Payer: Self-pay | Admitting: Family Medicine

## 2022-01-13 ENCOUNTER — Ambulatory Visit (INDEPENDENT_AMBULATORY_CARE_PROVIDER_SITE_OTHER): Payer: Medicare Other | Admitting: Family Medicine

## 2022-01-13 DIAGNOSIS — F4310 Post-traumatic stress disorder, unspecified: Secondary | ICD-10-CM

## 2022-01-13 NOTE — Assessment & Plan Note (Signed)
Acute on chronic in nature.  Has not been able to reestablish with a psychologist.  Counseled on possible online therapy. ?

## 2022-01-13 NOTE — Progress Notes (Signed)
?  Rebecca [name] - 66 y.o. female MRN [id number]  Date of birth: [birth year] ? ?SUBJECTIVE:  Including CC & ROS.  ?No chief complaint on file. ? ? ?Rebecca [name] is a 66 y.o. female that is following up for her PTSD.  She continues to have trouble sleeping.  She tosses and turns most nights.  She has not been able to get back with a psychologist. ? ? ?Review of Systems ?See HPI  ? ?HISTORY: Past Medical, Surgical, Social, and Family History Reviewed & Updated per EMR.   ?Pertinent Historical Findings include: ? ?Past Medical History:  ?Diagnosis Date  ? Hyperlipidemia   ? Neck pain   ? Obesity   ? Peritonsillar abscess 02/03/2017  ? Thyroid disease   ? monitoring a nodule 1/9  ? ? ?Past Surgical History:  ?Procedure Laterality Date  ? ABDOMINAL HYSTERECTOMY    ? ? ? ?PHYSICAL EXAM:  ?VS: BP 130/78 (BP Location: Left Arm, Patient Position: Sitting)   Ht '5\' 3"'$  (1.6 m)   Wt 215 lb (97.5 kg)   BMI 38.09 kg/m?  ?Physical Exam ?Gen: NAD, alert, cooperative with exam, well-appearing ?MSK:  ?Neurovascularly intact   ? ? ? ? ?ASSESSMENT & PLAN:  ? ?PTSD (post-traumatic stress disorder) ?Acute on chronic in nature.  Has not been able to reestablish with a psychologist.  Counseled on possible online therapy. ? ? ? ? ?

## 2022-01-14 ENCOUNTER — Ambulatory Visit: Payer: Medicare Other | Admitting: Family Medicine

## 2022-01-20 ENCOUNTER — Other Ambulatory Visit: Payer: Self-pay

## 2022-01-20 ENCOUNTER — Ambulatory Visit (INDEPENDENT_AMBULATORY_CARE_PROVIDER_SITE_OTHER): Payer: Medicare Other | Admitting: Nurse Practitioner

## 2022-01-20 ENCOUNTER — Encounter: Payer: Self-pay | Admitting: Nurse Practitioner

## 2022-01-20 VITALS — BP 156/77 | HR 65 | Temp 97.6°F | Ht 63.0 in | Wt 216.8 lb

## 2022-01-20 DIAGNOSIS — F4310 Post-traumatic stress disorder, unspecified: Secondary | ICD-10-CM

## 2022-01-20 DIAGNOSIS — Z13220 Encounter for screening for lipoid disorders: Secondary | ICD-10-CM | POA: Diagnosis not present

## 2022-01-20 DIAGNOSIS — R109 Unspecified abdominal pain: Secondary | ICD-10-CM

## 2022-01-20 DIAGNOSIS — R7303 Prediabetes: Secondary | ICD-10-CM | POA: Diagnosis not present

## 2022-01-20 DIAGNOSIS — I10 Essential (primary) hypertension: Secondary | ICD-10-CM

## 2022-01-20 DIAGNOSIS — R5383 Other fatigue: Secondary | ICD-10-CM

## 2022-01-20 LAB — POCT URINALYSIS DIP (CLINITEK)
Bilirubin, UA: NEGATIVE
Glucose, UA: NEGATIVE mg/dL
Ketones, POC UA: NEGATIVE mg/dL
Leukocytes, UA: NEGATIVE
Nitrite, UA: NEGATIVE
POC PROTEIN,UA: 100 — AB
Spec Grav, UA: >=1.03 — AB (ref 1.010–1.025)
Urobilinogen, UA: 0.2 E.U./dL
pH, UA: 5.5 (ref 5.0–8.0)

## 2022-01-20 LAB — POCT GLYCOSYLATED HEMOGLOBIN (HGB A1C)
HbA1c POC (<> result, manual entry): 5.5 % (ref 4.0–5.6)
HbA1c, POC (controlled diabetic range): 5.5 % (ref 0.0–7.0)
HbA1c, POC (prediabetic range): 5.5 % — AB (ref 5.7–6.4)
Hemoglobin A1C: 5.5 % (ref 4.0–5.6)

## 2022-01-20 MED ORDER — BENZONATATE 100 MG PO CAPS: 100.0000 mg | ORAL_CAPSULE | Freq: Three times a day (TID) | ORAL | 0 refills | Status: AC | PRN

## 2022-01-20 NOTE — Patient Instructions (Signed)
Gastroesophageal Reflux Disease, Adult ?Gastroesophageal reflux (GER) happens when acid from the stomach flows up into the tube that connects the mouth and the stomach (esophagus). Normally, food travels down the esophagus and stays in the stomach to be digested. With GER, food and stomach acid sometimes move back up into the esophagus. ?You may have a disease called gastroesophageal reflux disease (GERD) if the reflux: ?Happens often. ?Causes frequent or very bad symptoms. ?Causes problems such as damage to the esophagus. ?When this happens, the esophagus becomes sore and swollen. Over time, GERD can make small holes (ulcers) in the lining of the esophagus. ?What are the causes? ?This condition is caused by a problem with the muscle between the esophagus and the stomach. When this muscle is weak or not normal, it does not close properly to keep food and acid from coming back up from the stomach. ?The muscle can be weak because of: ?Tobacco use. ?Pregnancy. ?Having a certain type of hernia (hiatal hernia). ?Alcohol use. ?Certain foods and drinks, such as coffee, chocolate, onions, and peppermint. ?What increases the risk? ?Being overweight. ?Having a disease that affects your connective tissue. ?Taking NSAIDs, such a ibuprofen. ?What are the signs or symptoms? ?Heartburn. ?Difficult or painful swallowing. ?The feeling of having a lump in the throat. ?A bitter taste in the mouth. ?Bad breath. ?Having a lot of saliva. ?Having an upset or bloated stomach. ?Burping. ?Chest pain. Different conditions can cause chest pain. Make sure you see your doctor if you have chest pain. ?Shortness of breath or wheezing. ?A long-term cough or a cough at night. ?Wearing away of the surface of teeth (tooth enamel). ?Weight loss. ?How is this treated? ?Making changes to your diet. ?Taking medicine. ?Having surgery. ?Treatment will depend on how bad your symptoms are. ?Follow these instructions at home: ?Eating and drinking ? ?Follow a  diet as told by your doctor. You may need to avoid foods and drinks such as: ?Coffee and tea, with or without caffeine. ?Drinks that contain alcohol. ?Energy drinks and sports drinks. ?Bubbly (carbonated) drinks or sodas. ?Chocolate and cocoa. ?Peppermint and mint flavorings. ?Garlic and onions. ?Horseradish. ?Spicy and acidic foods. These include peppers, chili powder, curry powder, vinegar, hot sauces, and BBQ sauce. ?Citrus fruit juices and citrus fruits, such as oranges, lemons, and limes. ?Tomato-based foods. These include red sauce, chili, salsa, and pizza with red sauce. ?Fried and fatty foods. These include donuts, french fries, potato chips, and high-fat dressings. ?High-fat meats. These include hot dogs, rib eye steak, sausage, ham, and bacon. ?High-fat dairy items, such as whole milk, butter, and cream cheese. ?Eat small meals often. Avoid eating large meals. ?Avoid drinking large amounts of liquid with your meals. ?Avoid eating meals during the 2-3 hours before bedtime. ?Avoid lying down right after you eat. ?Do not exercise right after you eat. ?Lifestyle ? ?Do not smoke or use any products that contain nicotine or tobacco. If you need help quitting, ask your doctor. ?Try to lower your stress. If you need help doing this, ask your doctor. ?If you are overweight, lose an amount of weight that is healthy for you. Ask your doctor about a safe weight loss goal. ?General instructions ?Pay attention to any changes in your symptoms. ?Take over-the-counter and prescription medicines only as told by your doctor. ?Do not take aspirin, ibuprofen, or other NSAIDs unless your doctor says it is okay. ?Wear loose clothes. Do not wear anything tight around your waist. ?Raise (elevate) the head of your bed about 6  inches (15 cm). You may need to use a wedge to do this. ?Avoid bending over if this makes your symptoms worse. ?Keep all follow-up visits. ?Contact a doctor if: ?You have new symptoms. ?You lose weight and you  do not know why. ?You have trouble swallowing or it hurts to swallow. ?You have wheezing or a cough that keeps happening. ?You have a hoarse voice. ?Your symptoms do not get better with treatment. ?Get help right away if: ?You have sudden pain in your arms, neck, jaw, teeth, or back. ?You suddenly feel sweaty, dizzy, or light-headed. ?You have chest pain or shortness of breath. ?You vomit and the vomit is green, yellow, or black, or it looks like blood or coffee grounds. ?You faint. ?Your poop (stool) is red, bloody, or black. ?You cannot swallow, drink, or eat. ?These symptoms may represent a serious problem that is an emergency. Do not wait to see if the symptoms will go away. Get medical help right away. Call your local emergency services (911 in the [place] Do not drive yourself to the hospital. ?Summary ?If a person has gastroesophageal reflux disease (GERD), food and stomach acid move back up into the esophagus and cause symptoms or problems such as damage to the esophagus. ?Treatment will depend on how bad your symptoms are. ?Follow a diet as told by your doctor. ?Take all medicines only as told by your doctor. ?This information is not intended to replace advice given to you by your health care provider. Make sure you discuss any questions you have with your health care provider. ?Document Revised: 05/05/2020 Document Reviewed: 05/05/2020 ?Elsevier Patient Education ? [place]. ?Cough, Adult ?A cough helps to clear your throat and lungs. A cough may be a sign of an illness or another medical condition. ?An acute cough may only last 2-3 weeks, while a chronic cough may last 8 or more weeks. ?Many things can cause a cough. They include: ?Germs (viruses or bacteria) that attack the airway. ?Breathing in things that bother (irritate) your lungs. ?Allergies. ?Asthma. ?Mucus that runs down the back of your throat (postnasal drip). ?Smoking. ?Acid backing up from the stomach into the tube that moves food  from the mouth to the stomach (gastroesophageal reflux). ?Some medicines. ?Lung problems. ?Other medical conditions, such as heart failure or a blood clot in the lung (pulmonary embolism). ?Follow these instructions at home: ?Medicines ?Take over-the-counter and prescription medicines only as told by your doctor. ?Talk with your doctor before you take medicines that stop a cough (cough suppressants). ?Lifestyle ? ?Do not smoke, and try not to be around smoke. Do not use any products that contain nicotine or tobacco, such as cigarettes, e-cigarettes, and chewing tobacco. If you need help quitting, ask your doctor. ?Drink enough fluid to keep your pee (urine) pale yellow. ?Avoid caffeine. ?Do not drink alcohol if your doctor tells you not to drink. ?General instructions ? ?Watch for any changes in your cough. Tell your doctor about them. ?Always cover your mouth when you cough. ?Stay away from things that make you cough, such as perfume, candles, campfire smoke, or cleaning products. ?If the air is dry, use a cool mist vaporizer or humidifier in your home. ?If your cough is worse at night, try using extra pillows to raise your head up higher while you sleep. ?Rest as needed. ?Keep all follow-up visits as told by your doctor. This is important. ?Contact a doctor if: ?You have new symptoms. ?You cough up pus. ?Your cough does not  get better after 2-3 weeks, or your cough gets worse. ?Cough medicine does not help your cough and you are not sleeping well. ?You have pain that gets worse or pain that is not helped with medicine. ?You have a fever. ?You are losing weight and you do not know why. ?You have night sweats. ?Get help right away if: ?You cough up blood. ?You have trouble breathing. ?Your heartbeat is very fast. ?These symptoms may be an emergency. Do not wait to see if the symptoms will go away. Get medical help right away. Call your local emergency services (911 in the [place] Do not drive yourself to the  hospital. ?Summary ?A cough helps to clear your throat and lungs. Many things can cause a cough. ?Take over-the-counter and prescription medicines only as told by your doctor. ?Always cover your mouth when you c

## 2022-01-20 NOTE — Progress Notes (Signed)
? ?[facility] ?[place], [place]  [address] ?Phone:  [phone]   Fax:  [phone] ? ? ?Established Patient Office Visit ? ?Subjective:  ?Patient ID: Rebecca [name], female    DOB: January 29, 1956  Age: 66 y.o. MRN: [id number] ? ?CC:  ?Chief Complaint  ?Patient presents with  ? Follow-up  ?  Pt is here for 6 month follow up. Pt stated she is coughing up mucous   ? ? ?HPI ?Rebecca [name] presents for follow up.  has a past medical history of Hyperlipidemia, Neck pain, Obesity, Peritonsillar abscess (02/03/2017), and Thyroid disease.  ? ?Cough ?Patient complains of  cough . Symptoms began several weeks ago. Symptoms have been gradually improving since that time.The cough is productive and is aggravated by nothing. Associated symptoms include: change in voice and sputum production. She does have GERD and uses the famotide sparingly. She reports that she struggles with medication. She is not consistent. .  Patient does not have new pets. Patient does not have a history of asthma. Patient does not have a history of environmental allergens. Patient has not traveled recently. Patient does not have a history of smoking. Patient has not had a previous chest x-ray. Patient has not had a PPD done.   ? ?She is a little stressed due to losing her therapist. She is not wanting to retell her story. She is concerns about mental health system.   ? ?She denies any additional assistance.  ? ?She reports that her weight fluctuates  ? ?Past Medical History:  ?Diagnosis Date  ? Hyperlipidemia   ? Neck pain   ? Obesity   ? Peritonsillar abscess 02/03/2017  ? Thyroid disease   ? monitoring a nodule 1/9  ? ? ?Past Surgical History:  ?Procedure Laterality Date  ? ABDOMINAL HYSTERECTOMY    ? ? ?Family History  ?Problem Relation Age of Onset  ? Cancer Mother   ?     pancreatic cancer  ? Diabetes Mother   ? Heart disease Mother   ?     rheumatic heart disease  ? Hyperlipidemia Mother   ? COPD Sister   ? Sleep  apnea Sister   ? Cancer Sister   ?     Lung Cancer  ? Cancer Brother   ?     lung cancer  ? Cancer Father   ?     renal, bone  ? Diabetes Father   ? Hyperlipidemia Father   ? Heart attack Maternal Grandfather   ? Kidney disease Brother   ? Colon cancer Maternal Aunt   ? ? ?Social History  ? ?Socioeconomic History  ? Marital status: Single  ?  Spouse name: N/A  ? Number of children: 0  ? Years of education: 18.5  ? Highest education level: Not on file  ?Occupational History  ? Occupation: Retired  ?  Employer: [employer]  ?  Comment: [employer] x 30 years  ? Occupation: [employer]  ?  Comment: [place] [employer] [place]  ?Tobacco Use  ? Smoking status: Never  ? Smokeless tobacco: Never  ?Vaping Use  ? Vaping Use: Never used  ?Substance and Sexual Activity  ? Alcohol use: Yes  ?  Alcohol/week: 5.0 - 7.0 standard drinks  ?  Types: 5 - 7 Standard drinks or equivalent per week  ?  Comment: occ  ? Drug use: No  ? Sexual activity: Yes  ?  Partners: Male  ?  Birth  control/protection: Surgical  ?Other Topics Concern  ? Not on file  ?Social History Narrative  ? Close friend/significant other killed (GSW) 2012.  ? Retired after 30 years, and took another job (commutes to [place] 4 days/week).  ? ?Social Determinants of Health  ? ?Financial Resource Strain: Not on file  ?Food Insecurity: Not on file  ?Transportation Needs: Not on file  ?Physical Activity: Not on file  ?Stress: Not on file  ?Social Connections: Not on file  ?Intimate Partner Violence: Not on file  ? ? ?Outpatient Medications Prior to Visit  ?Medication Sig Dispense Refill  ? cholecalciferol (VITAMIN D) 1000 units tablet Take 1,000 Units by mouth daily.    ? famotidine (PEPCID) 20 MG tablet Take 20 mg by mouth 2 (two) times daily.    ? Omega-3 Fatty Acids (FISH OIL) 1000 MG CAPS Take by mouth.    ? pyridOXINE (VITAMIN B-6) 100 MG tablet Take 100 mg by mouth daily.    ? sertraline (ZOLOFT) 25 MG tablet Take 3 tablets (75 mg total) by  mouth daily. 90 tablet 1  ? acetaminophen (TYLENOL) 500 MG tablet Take 1 tablet (500 mg total) by mouth every 6 (six) hours as needed. 30 tablet 0  ? cyclobenzaprine (FLEXERIL) 10 MG tablet Take 1 tablet (10 mg total) by mouth 3 (three) times daily as needed for muscle spasms. (Patient not taking: Reported on 01/20/2022) 30 tablet 3  ? traZODone (DESYREL) 50 MG tablet Take 0.5-1 tablets (25-50 mg total) by mouth at bedtime as needed for sleep. (Patient not taking: Reported on 07/23/2021) 90 tablet 3  ? phentermine 37.5 MG capsule Take 37.5 mg by mouth every morning. (Patient not taking: Reported on 01/20/2022)    ? ?No facility-administered medications prior to visit.  ? ? ?Allergies  ?Allergen Reactions  ? Prednisolone Other (See Comments)  ?  Patient can't remember  ?Other reaction(s): Delusions (intolerance) ?Patient can't remember   ? ? ?ROS ?Review of Systems ? ?  ?Objective:  ?  ?Physical Exam ?HENT:  ?   Head: Normocephalic and atraumatic.  ?   Nose: Nose normal.  ?   Mouth/Throat:  ?   Mouth: Mucous membranes are moist.  ?Cardiovascular:  ?   Rate and Rhythm: Normal rate. Rhythm irregular.  ?   Pulses: Normal pulses.  ?   Heart sounds: Normal heart sounds.  ?Pulmonary:  ?   Effort: Pulmonary effort is normal.  ?   Breath sounds: Normal breath sounds.  ?Abdominal:  ?   Palpations: Abdomen is soft.  ?Musculoskeletal:     ?   General: Normal range of motion.  ?   Cervical back: Normal range of motion.  ?   Right lower leg: No edema.  ?   Left lower leg: No edema.  ?Skin: ?   General: Skin is warm.  ?   Capillary Refill: Capillary refill takes less than 2 seconds.  ?Neurological:  ?   General: No focal deficit present.  ?   Mental Status: She is alert and oriented to person, place, and time.  ?Psychiatric:     ?   Mood and Affect: Mood normal.     ?   Behavior: Behavior normal.     ?   Thought Content: Thought content normal.     ?   Judgment: Judgment normal.  ? ? ?BP (!) 169/89 (BP Location: Right Arm, Patient  Position: Sitting)   Pulse 66   Temp 97.6 ?F (36.4 ?C)   Ht  $'5\' 3"'a$  (1.6 m)   Wt 216 lb 12.8 oz (98.3 kg)   SpO2 99%   BMI 38.40 kg/m?  ?Wt Readings from Last 3 Encounters:  ?01/20/22 216 lb 12.8 oz (98.3 kg)  ?01/13/22 215 lb (97.5 kg)  ?10/22/21 215 lb (97.5 kg)  ? ? ? ?Health Maintenance Due  ?Topic Date Due  ? MAMMOGRAM  12/25/2018  ? HEMOGLOBIN A1C  10/22/2020  ? ? ?There are no preventive care reminders to display for this patient. ? ?Lab Results  ?Component Value Date  ? TSH 1.190 04/22/2020  ? ?Lab Results  ?Component Value Date  ? WBC 7.0 04/22/2020  ? HGB 13.3 04/22/2020  ? HCT 39.8 04/22/2020  ? MCV 90 04/22/2020  ? PLT 216 04/22/2020  ? ?Lab Results  ?Component Value Date  ? NA 143 04/22/2020  ? K 4.2 04/22/2020  ? CO2 27 04/22/2020  ? GLUCOSE 84 04/22/2020  ? BUN 15 04/22/2020  ? CREATININE 0.73 04/22/2020  ? BILITOT 0.3 04/22/2020  ? ALKPHOS 78 04/22/2020  ? AST 18 04/22/2020  ? ALT 11 04/22/2020  ? PROT 7.2 04/22/2020  ? ALBUMIN 4.5 04/22/2020  ? CALCIUM 9.9 04/22/2020  ? ?Lab Results  ?Component Value Date  ? CHOL 256 (H) 04/22/2020  ? ?Lab Results  ?Component Value Date  ? HDL 61 04/22/2020  ? ?Lab Results  ?Component Value Date  ? LDLCALC 172 (H) 04/22/2020  ? ?Lab Results  ?Component Value Date  ? TRIG 128 04/22/2020  ? ?Lab Results  ?Component Value Date  ? CHOLHDL 4.2 04/22/2020  ? ?Lab Results  ?Component Value Date  ? HGBA1C 5.7 (H) 04/22/2020  ? ? ?  ?Assessment & Plan:  ? ?Problem List Items Addressed This Visit   ? ?  ? Cardiovascular and Mediastinum  ? Essential hypertension - Primary ?Persistent Encouraged on going compliance with current medication regimen ?Encouraged home monitoring and recording BP <130/80 ?Eating a heart-healthy diet with less salt ?Encouraged regular physical activity  ?Recommend Weight loss ? ?  ? Relevant Orders  ? Comp. Metabolic Panel (12)  ?  ? Other  ? PTSD (post-traumatic stress disorder) ?Persistent  ?No counseling at this time. Dealing with symptoms uses  spiritually to help her cope.   ? ?Other Visit Diagnoses   ? ? Prediabetes     ?Consider home glucose monitoring ?Weight loss at least 5% of current body weight is can be achieved with lifestyle modification

## 2022-01-21 LAB — LIPID PANEL
Chol/HDL Ratio: 4.8 ratio — ABNORMAL HIGH (ref 0.0–4.4)
Cholesterol, Total: 310 mg/dL — ABNORMAL HIGH (ref 100–199)
HDL: 64 mg/dL
LDL Chol Calc (NIH): 199 mg/dL — ABNORMAL HIGH (ref 0–99)
Triglycerides: 244 mg/dL — ABNORMAL HIGH (ref 0–149)
VLDL Cholesterol Cal: 47 mg/dL — ABNORMAL HIGH (ref 5–40)

## 2022-01-21 LAB — COMP. METABOLIC PANEL (12)
AST: 16 IU/L (ref 0–40)
Albumin/Globulin Ratio: 1.5 (ref 1.2–2.2)
Albumin: 4.6 g/dL (ref 3.8–4.8)
Alkaline Phosphatase: 113 IU/L (ref 44–121)
BUN/Creatinine Ratio: 18 (ref 12–28)
BUN: 12 mg/dL (ref 8–27)
Bilirubin Total: 0.3 mg/dL (ref 0.0–1.2)
Calcium: 9.7 mg/dL (ref 8.7–10.3)
Chloride: 102 mmol/L (ref 96–106)
Creatinine, Ser: 0.65 mg/dL (ref 0.57–1.00)
Globulin, Total: 3 g/dL (ref 1.5–4.5)
Glucose: 97 mg/dL (ref 70–99)
Potassium: 4 mmol/L (ref 3.5–5.2)
Sodium: 139 mmol/L (ref 134–144)
Total Protein: 7.6 g/dL (ref 6.0–8.5)
eGFR: 99 mL/min/{1.73_m2} (ref >59)

## 2022-01-21 LAB — TSH: TSH: 1.64 u[IU]/mL (ref 0.450–4.500)

## 2022-04-15 ENCOUNTER — Encounter: Payer: Self-pay | Admitting: Family Medicine

## 2022-04-15 ENCOUNTER — Ambulatory Visit (INDEPENDENT_AMBULATORY_CARE_PROVIDER_SITE_OTHER): Payer: Medicare Other | Admitting: Family Medicine

## 2022-04-15 DIAGNOSIS — F4310 Post-traumatic stress disorder, unspecified: Secondary | ICD-10-CM | POA: Diagnosis not present

## 2022-04-15 MED ORDER — SERTRALINE HCL 25 MG PO TABS: 75.0000 mg | ORAL_TABLET | Freq: Every day | ORAL | 1 refills | Status: DC

## 2022-04-15 NOTE — Progress Notes (Signed)
  Marrian Bells - 66 y.o. female MRN [id number]  Date of birth: [birth year]  SUBJECTIVE:  Including CC & ROS.  No chief complaint on file.   Genoa Freyre is a 66 y.o. female that is following up for her PTSD.  She has not been on her Zoloft for some time.  She reports increased insomnia and low mood.  She has not been able to establish with a new counselor or psychologist.   Review of Systems See HPI   HISTORY: Past Medical, Surgical, Social, and Family History Reviewed & Updated per EMR.   Pertinent Historical Findings include:  Past Medical History:  Diagnosis Date   Hyperlipidemia    Neck pain    Obesity    Peritonsillar abscess 02/03/2017   Thyroid disease    monitoring a nodule 1/9    Past Surgical History:  Procedure Laterality Date   ABDOMINAL HYSTERECTOMY       PHYSICAL EXAM:  VS: BP 140/90 (BP Location: Left Arm, Patient Position: Sitting)   Ht '5\' 3"'$  (1.6 m)   Wt 216 lb (98 kg)   BMI 38.26 kg/m  Physical Exam Gen: NAD, alert, cooperative with exam, well-appearing MSK:  Neurovascularly intact       ASSESSMENT & PLAN:   PTSD (post-traumatic stress disorder) Acutely worsening.  Has been having more of a rough time recently. -Counseled on home exercise therapy and supportive care. -Refill Zoloft. -We will to continue to explore counseling services.

## 2022-04-15 NOTE — Assessment & Plan Note (Signed)
Acutely worsening.  Has been having more of a rough time recently. -Counseled on home exercise therapy and supportive care. -Refill Zoloft. -We will to continue to explore counseling services.

## 2022-07-22 ENCOUNTER — Ambulatory Visit: Payer: Medicare Other | Admitting: Family Medicine

## 2022-07-26 ENCOUNTER — Ambulatory Visit: Payer: Self-pay | Admitting: Nurse Practitioner

## 2022-08-12 ENCOUNTER — Ambulatory Visit: Payer: Medicare Other | Admitting: Family Medicine

## 2022-10-15 ENCOUNTER — Encounter: Payer: Self-pay | Admitting: Nurse Practitioner

## 2022-10-15 ENCOUNTER — Ambulatory Visit (INDEPENDENT_AMBULATORY_CARE_PROVIDER_SITE_OTHER): Payer: Medicare Other | Admitting: Nurse Practitioner

## 2022-10-15 ENCOUNTER — Other Ambulatory Visit: Payer: Self-pay | Admitting: Nurse Practitioner

## 2022-10-15 VITALS — BP 150/88 | HR 78 | Temp 97.8°F | Ht 63.0 in | Wt 218.6 lb

## 2022-10-15 DIAGNOSIS — I10 Essential (primary) hypertension: Secondary | ICD-10-CM | POA: Diagnosis not present

## 2022-10-15 DIAGNOSIS — R7303 Prediabetes: Secondary | ICD-10-CM | POA: Diagnosis not present

## 2022-10-15 DIAGNOSIS — G4700 Insomnia, unspecified: Secondary | ICD-10-CM | POA: Insufficient documentation

## 2022-10-15 DIAGNOSIS — Z1329 Encounter for screening for other suspected endocrine disorder: Secondary | ICD-10-CM

## 2022-10-15 DIAGNOSIS — R55 Syncope and collapse: Secondary | ICD-10-CM

## 2022-10-15 DIAGNOSIS — R5383 Other fatigue: Secondary | ICD-10-CM | POA: Diagnosis not present

## 2022-10-15 MED ORDER — AMLODIPINE BESYLATE 2.5 MG PO TABS: 2.5000 mg | ORAL_TABLET | Freq: Every day | ORAL | 0 refills | Status: DC

## 2022-10-15 MED ORDER — HYDROXYZINE PAMOATE 25 MG PO CAPS: 25.0000 mg | ORAL_CAPSULE | Freq: Every evening | ORAL | 0 refills | Status: DC | PRN

## 2022-10-15 NOTE — Assessment & Plan Note (Signed)
Lab Results  Component Value Date   HGBA1C 5.5 01/20/2022   HGBA1C 5.5 01/20/2022   HGBA1C 5.5 (A) 01/20/2022   HGBA1C 5.5 01/20/2022    Lab Results  Component Value Date   HGBA1C 5.6 10/15/2022

## 2022-10-15 NOTE — Patient Instructions (Signed)
Nurse visit in 2 weeks to check BP,    Around 3 times per week, check your blood pressure 2 times per day. once in the morning and once in the evening. The readings should be at least one minute apart. Write down these values and bring them to your next nurse visit/appointment.  When you check your BP, make sure you have been doing something calm/relaxing 5 minutes prior to checking. Both feet should be flat on the floor and you should be sitting. Use your left arm and make sure it is in a relaxed position (on a table), and that the cuff is at the approximate level/height of your heart.   Blood pressure goal is less than 140/90.   Essential hypertension  - CBC with Differential - CMP14+EGFR - amLODipine (NORVASC) 2.5 MG tablet; Take 1 tablet (2.5 mg total) by mouth daily.    Insomnia, unspecified type  - hydrOXYzine (VISTARIL) 25 MG capsule; Take 1 capsule (25 mg total) by mouth at bedtime as needed.    It is important that you exercise regularly at least 30 minutes 5 times a week as tolerated  Think about what you will eat, plan ahead. Choose " clean, green, fresh or frozen" over canned, processed or packaged foods which are more sugary, salty and fatty. 70 to 75% of food eaten should be vegetables and fruit. Three meals at set times with snacks allowed between meals, but they must be fruit or vegetables. Aim to eat over a 12 hour period , example 7 am to 7 pm, and STOP after  your last meal of the day. Drink water,generally about 64 ounces per day, no other drink is as healthy. Fruit juice is best enjoyed in a healthy way, by EATING the fruit.  Thanks for choosing [facility] we consider it a privelige to serve you.

## 2022-10-15 NOTE — Assessment & Plan Note (Signed)
BP Readings from Last 3 Encounters:  10/15/22 (!) 150/88  04/15/22 140/90  01/20/22 (!) 156/77  Blood pressure is significantly elevated Start amlodipine 2.5 mg daily follow-up in 2 weeks for blood check will increase dose of amlodipine to 5 mg in 2 weeks if blood pressure is greater then 140/90. DASH diet advised patient encouraged to engage in regular moderate exercise with at least 150 minutes weekly as tolerated Check CMP

## 2022-10-15 NOTE — Assessment & Plan Note (Addendum)
Had 1 episode of syncope about a week ago Check TSH, CBC, CMP EKG shows NSR 66BPM.  Minimal voltage criteria for LVH, may be normal variant .T wave abnormality consider inferior ischemia.  Patient referred to cardiology

## 2022-10-15 NOTE — Progress Notes (Signed)
Established Patient Office Visit  Subjective:  Patient ID: Rebecca [name], female    DOB: [birth year]  Age: 66 y.o. MRN: [id number]  CC:  Chief Complaint  Patient presents with   Loss of Consciousness    Started feeling hot and tired then fainted.      HPI Rebecca [name] is a 66 y.o. female with past medical history of hypertension, hyperlipidemia, obesity, spinal stenosis in cervical region, PTSD, history of thyroid nodule who presents with complaints of syncope.  Patient stated that she was at a party a week ago.  Prior to going to the party she was exhausted and tired, while at the party she felt dizzy and passed out.  She stated that she had medical personnel and present at the party that assisted her after the incident, and so did not go to the emergency room to be evaluated.  Patient states that she stays busy.  She currently denies chest pain, fever, body aches, shortness of breath, edema.  Has intermittent palpitations and feels tired.    Insomnia.  Patient states that she sleeps 3 to 4 hours nightly snores and feels not well rested in the morning patient stated that she had a sleep study done in the past and she was told that she had mild sleep apnea.  She drinks coffee in the morning and sleeps with her TV on.       Past Medical History:  Diagnosis Date   Hyperlipidemia    Neck pain    Obesity    Peritonsillar abscess 02/03/2017   Thyroid disease    monitoring a nodule 1/9    Past Surgical History:  Procedure Laterality Date   ABDOMINAL HYSTERECTOMY      Family History  Problem Relation Age of Onset   Cancer Mother        pancreatic cancer   Diabetes Mother    Heart disease Mother        rheumatic heart disease   Hyperlipidemia Mother    COPD Sister    Sleep apnea Sister    Cancer Sister        Lung Cancer   Cancer Brother        lung cancer   Cancer Father        renal, bone   Diabetes Father    Hyperlipidemia Father    Heart attack Maternal  Grandfather    Kidney disease Brother    Colon cancer Maternal Aunt     Social History   Socioeconomic History   Marital status: Single    Spouse name: N/A   Number of children: 0   Years of education: 18.5   Highest education level: Not on file  Occupational History   Occupation: Retired    Associate Professor: Advice worker    Comment: [employer] x 30 years   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   Retired after 30 years, and took another job (commutes to [place] 4 days/week).   Social Determinants of Health   Financial Resource Strain: Low Risk  (08/19/2018)   Overall Financial Resource Strain (CARDIA)    Difficulty of Paying Living Expenses: Not hard at all  Food Insecurity: No Food Insecurity (08/19/2018)   Hunger Vital Sign    Worried About Running Out of Food in the Last Year: Never true    Ran Out of Food in the Last Year: Never true  Transportation Needs: No Transportation Needs (08/19/2018)   PRAPARE - Administrator, Civil Service (Medical): No    Lack of Transportation (Non-Medical): No  Physical Activity: Inactive (08/19/2018)   Exercise Vital Sign    Days of Exercise per Week: 0 days    Minutes of Exercise per Session: 0 min  Stress: Stress Concern Present (08/19/2018)   [employer] of Occupational Health - Occupational Stress Questionnaire    Feeling of Stress : Very much  Social Connections: Unknown  (08/19/2018)   Social Connection and Isolation Panel [NHANES]    Frequency of Communication with Friends and Family: More than three times a week    Frequency of Social Gatherings with Friends and Family: More than three times a week    Attends Religious Services: More than 4 times per year    Active Member of [employer] or Organizations: Yes    Attends Banker Meetings: More than 4 times per year    Marital Status: Not on file  Intimate Partner Violence: Not At Risk (08/19/2018)   Humiliation, Afraid, Rape, and Kick questionnaire    Fear of Current or Ex-Partner: No    Emotionally Abused: No    Physically Abused: No    Sexually Abused: No    Outpatient Medications Prior to Visit  Medication Sig Dispense Refill   cholecalciferol (VITAMIN D) 1000 units tablet Take 1,000 Units by mouth daily.     famotidine (PEPCID) 20 MG tablet Take 20 mg by mouth 2 (two) times daily.     Omega-3 Fatty Acids (FISH OIL) 1000 MG CAPS Take by mouth.     pyridOXINE (VITAMIN B-6) 100 MG tablet Take 100 mg by mouth daily.     sertraline (ZOLOFT) 25 MG tablet Take 3 tablets (75 mg total) by mouth daily. 90 tablet 1   acetaminophen (TYLENOL) 500 MG tablet Take 1 tablet (500 mg total) by mouth every 6 (six) hours as needed. (Patient not taking: Reported on 10/15/2022) 30 tablet 0   cyclobenzaprine (FLEXERIL) 10 MG tablet Take 1 tablet (10 mg total) by mouth 3 (three) times daily as needed for muscle spasms. (Patient not taking: Reported on 01/20/2022) 30 tablet 3   traZODone (DESYREL) 50 MG tablet Take 0.5-1 tablets (25-50 mg total) by mouth at bedtime as needed for sleep. (Patient not taking: Reported on 07/23/2021) 90 tablet 3   No facility-administered medications prior to visit.    Allergies  Allergen Reactions   Prednisolone Other (See Comments)    Patient can't remember  Other reaction(s): Delusions (intolerance) Patient can't remember    Prednisone     Other reaction(s): BAD  DREAMS/HALLUCINATIONS    ROS Review of Systems  Constitutional:  Positive for fatigue. Negative for activity change, appetite change, chills, diaphoresis and fever.  Respiratory: Negative.  Negative for cough, choking, chest tightness, shortness of breath and wheezing.   Cardiovascular: Negative.  Negative for chest pain, palpitations and leg swelling.  Musculoskeletal: Negative.   Neurological:  Positive for syncope. Negative for tremors, seizures, facial asymmetry, speech difficulty and numbness.  Psychiatric/Behavioral:  Positive for sleep disturbance. Negative for agitation, behavioral problems, confusion, decreased concentration, self-injury and suicidal ideas.       Objective:    Physical Exam Constitutional:      General: She is not in acute distress.    Appearance: She is obese. She is not ill-appearing, toxic-appearing or diaphoretic.  Eyes:     General: No scleral icterus.       Right eye: No discharge.        Left eye: No discharge.     Extraocular Movements: Extraocular movements intact.     Conjunctiva/sclera: Conjunctivae normal.  Cardiovascular:     Rate and Rhythm: Normal rate and regular rhythm.     Pulses: Normal pulses.     Heart sounds: Normal heart sounds. No murmur heard.    No friction rub. No gallop.  Pulmonary:     Effort: Pulmonary effort is normal. No respiratory distress.     Breath sounds: Normal breath sounds. No stridor. No wheezing, rhonchi or rales.  Chest:     Chest wall: No tenderness.  Abdominal:     General: There is no distension.     Palpations: Abdomen is soft.     Tenderness: There is no abdominal tenderness. There is no guarding.  Musculoskeletal:        General: No swelling, tenderness, deformity or signs of injury. Normal range of motion.     Right lower leg: No edema.     Left lower leg: No edema.  Skin:    General: Skin is warm and dry.     Capillary Refill: Capillary refill takes less than 2 seconds.  Neurological:      Mental Status: She is alert and oriented to person, place, and time.     Sensory: No sensory deficit.     Motor: No weakness.     Coordination: Coordination normal.     Gait: Gait normal.  Psychiatric:        Mood and Affect: Mood normal.        Behavior: Behavior normal.        Thought Content: Thought content normal.        Judgment: Judgment normal.     BP (!) 150/88   Pulse 78   Temp 97.8 F (36.6 C)   Ht 5\' 3"  (1.6 m)   Wt 218 lb 9.6 oz (99.2 kg)   SpO2 100%   BMI 38.72 kg/m  Wt Readings from Last 3 Encounters:  10/15/22 218 lb 9.6 oz (99.2 kg)  04/15/22 216 lb (98 kg)  01/20/22 216 lb 12.8 oz (98.3 kg)    Lab Results  Component Value Date   TSH 1.430 10/15/2022   Lab Results  Component Value Date   WBC 9.4 10/15/2022   HGB 13.0 10/15/2022   HCT 37.5 10/15/2022   MCV 91 10/15/2022   PLT 229 10/15/2022   Lab Results  Component Value Date   NA 140 10/15/2022   K 4.7 10/15/2022   CO2 21 10/15/2022   GLUCOSE 96 10/15/2022   BUN 10 10/15/2022   CREATININE 0.67 10/15/2022   BILITOT <0.2 10/15/2022   ALKPHOS 99 10/15/2022   AST 24 10/15/2022   ALT 26 10/15/2022   PROT 6.9 10/15/2022   ALBUMIN 4.3 10/15/2022   CALCIUM 9.4 10/15/2022   EGFR 98 10/15/2022   Lab Results  Component Value  Date   CHOL 310 (H) 01/20/2022   Lab Results  Component Value Date   HDL 64 01/20/2022   Lab Results  Component Value Date   LDLCALC 199 (H) 01/20/2022   Lab Results  Component Value Date   TRIG 244 (H) 01/20/2022   Lab Results  Component Value Date   CHOLHDL 4.8 (H) 01/20/2022   Lab Results  Component Value Date   HGBA1C 5.6 10/15/2022      Assessment & Plan:   Problem List Items Addressed This Visit       Cardiovascular and Mediastinum   Essential hypertension - Primary    BP Readings from Last 3 Encounters:  10/15/22 (!) 150/88  04/15/22 140/90  01/20/22 (!) 156/77  Blood pressure is significantly elevated Start amlodipine 2.5 mg daily  follow-up in 2 weeks for blood check will increase dose of amlodipine to 5 mg in 2 weeks if blood pressure is greater then 140/90. DASH diet advised patient encouraged to engage in regular moderate exercise with at least 150 minutes weekly as tolerated Check CMP       Relevant Medications   amLODipine (NORVASC) 2.5 MG tablet   Other Relevant Orders   CBC with Differential (Completed)   CMP14+EGFR (Completed)   EKG 12-Lead     Other   Insomnia    Sleep hygiene discussed Hydroxyzine 25 mg at bedtime as needed ordered      Relevant Medications   hydrOXYzine (VISTARIL) 25 MG capsule   Syncope    Had 1 episode of syncope about a week ago Check TSH, CBC, CMP EKG shows NSR 66BPM.  Minimal voltage criteria for LVH, may be normal variant .T wave abnormality consider inferior ischemia.  Patient referred to cardiology      Relevant Orders   EKG 12-Lead   Prediabetes    Lab Results  Component Value Date   HGBA1C 5.5 01/20/2022   HGBA1C 5.5 01/20/2022   HGBA1C 5.5 (A) 01/20/2022   HGBA1C 5.5 01/20/2022    Lab Results  Component Value Date   HGBA1C 5.6 10/15/2022         Relevant Orders   POCT glycosylated hemoglobin (Hb A1C) (Completed)   CBC with Differential (Completed)   CMP14+EGFR (Completed)   Other fatigue    Check CBC, CMP, thyroid labs, vitamin D level Need to get adequate rest discussed with the patient      Relevant Orders   CBC with Differential (Completed)   TSH (Completed)   Vitamin D, 25-hydroxy (Completed)   EKG 12-Lead   Other Visit Diagnoses     Screening for thyroid disorder       Relevant Orders   TSH (Completed)       Meds ordered this encounter  Medications   amLODipine (NORVASC) 2.5 MG tablet    Sig: Take 1 tablet (2.5 mg total) by mouth daily.    Dispense:  60 tablet    Refill:  0   hydrOXYzine (VISTARIL) 25 MG capsule    Sig: Take 1 capsule (25 mg total) by mouth at bedtime as needed.    Dispense:  30 capsule    Refill:  0     Follow-up: Return in about 4 weeks (around 11/12/2022) for HTN.    Donell Beers, FNP

## 2022-10-15 NOTE — Assessment & Plan Note (Addendum)
Check CBC, CMP, thyroid labs, vitamin D level Need to get adequate rest discussed with the patient

## 2022-10-15 NOTE — Assessment & Plan Note (Signed)
Sleep hygiene discussed Hydroxyzine 25 mg at bedtime as needed ordered

## 2022-10-16 LAB — CBC WITH DIFFERENTIAL/PLATELET
Basophils Absolute: 0.1 10*3/uL (ref 0.0–0.2)
Basos: 1 %
EOS (ABSOLUTE): 0.3 10*3/uL (ref 0.0–0.4)
Eos: 3 %
Hematocrit: 37.5 % (ref 34.0–46.6)
Hemoglobin: 13 g/dL (ref 11.1–15.9)
Immature Grans (Abs): 0 10*3/uL (ref 0.0–0.1)
Immature Granulocytes: 0 %
Lymphocytes Absolute: 3.1 10*3/uL (ref 0.7–3.1)
Lymphs: 33 %
MCH: 31.5 pg (ref 26.6–33.0)
MCHC: 34.7 g/dL (ref 31.5–35.7)
MCV: 91 fL (ref 79–97)
Monocytes Absolute: 0.6 10*3/uL (ref 0.1–0.9)
Monocytes: 7 %
Neutrophils Absolute: 5.3 10*3/uL (ref 1.4–7.0)
Neutrophils: 56 %
Platelets: 229 10*3/uL (ref 150–450)
RBC: 4.13 x10E6/uL (ref 3.77–5.28)
RDW: 12.5 % (ref 11.7–15.4)
WBC: 9.4 10*3/uL (ref 3.4–10.8)

## 2022-10-16 LAB — CMP14+EGFR
ALT: 26 IU/L (ref 0–32)
AST: 24 IU/L (ref 0–40)
Albumin/Globulin Ratio: 1.7 (ref 1.2–2.2)
Albumin: 4.3 g/dL (ref 3.9–4.9)
Alkaline Phosphatase: 99 IU/L (ref 44–121)
BUN/Creatinine Ratio: 15 (ref 12–28)
BUN: 10 mg/dL (ref 8–27)
Bilirubin Total: <0.2 mg/dL (ref 0.0–1.2)
CO2: 21 mmol/L (ref 20–29)
Calcium: 9.4 mg/dL (ref 8.7–10.3)
Chloride: 103 mmol/L (ref 96–106)
Creatinine, Ser: 0.67 mg/dL (ref 0.57–1.00)
Globulin, Total: 2.6 g/dL (ref 1.5–4.5)
Glucose: 96 mg/dL (ref 70–99)
Potassium: 4.7 mmol/L (ref 3.5–5.2)
Sodium: 140 mmol/L (ref 134–144)
Total Protein: 6.9 g/dL (ref 6.0–8.5)
eGFR: 98 mL/min/{1.73_m2} (ref >59)

## 2022-10-16 LAB — VITAMIN D 25 HYDROXY (VIT D DEFICIENCY, FRACTURES): Vit D, 25-Hydroxy: 23.5 ng/mL — ABNORMAL LOW (ref 30.0–100.0)

## 2022-10-16 LAB — TSH: TSH: 1.43 u[IU]/mL (ref 0.450–4.500)

## 2022-10-16 NOTE — Progress Notes (Signed)
Normal labs except for low vitamin D. Start taking vitamin D 1000 units , get med OTC. Continue current dose of amlodipine 2.5 mg daily, follow up for BP check with the nurse in 2 weeks as planned. She should get adequate rest as dicussed

## 2022-10-18 LAB — POCT GLYCOSYLATED HEMOGLOBIN (HGB A1C): Hemoglobin A1C: 5.6 % (ref 4.0–5.6)

## 2022-10-29 ENCOUNTER — Ambulatory Visit (INDEPENDENT_AMBULATORY_CARE_PROVIDER_SITE_OTHER): Payer: Medicare Other

## 2022-10-29 DIAGNOSIS — I10 Essential (primary) hypertension: Secondary | ICD-10-CM

## 2022-10-29 NOTE — Progress Notes (Signed)
The patient is taking hypertensive medications compliantly without side effects.  Denies chest pain, dyspnea, edema, or TIA's. Pt is taking mucinex and thera flu. Per pt the amlodipine makes her stomach upset  .kh

## 2022-11-11 ENCOUNTER — Other Ambulatory Visit: Payer: Self-pay | Admitting: Nurse Practitioner

## 2022-11-11 DIAGNOSIS — G4700 Insomnia, unspecified: Secondary | ICD-10-CM

## 2022-11-12 ENCOUNTER — Encounter: Payer: Self-pay | Admitting: Nurse Practitioner

## 2022-11-12 ENCOUNTER — Ambulatory Visit (INDEPENDENT_AMBULATORY_CARE_PROVIDER_SITE_OTHER): Payer: Medicare Other | Admitting: Nurse Practitioner

## 2022-11-12 VITALS — BP 147/65 | HR 79 | Temp 97.7°F | Wt 223.6 lb

## 2022-11-12 DIAGNOSIS — I10 Essential (primary) hypertension: Secondary | ICD-10-CM

## 2022-11-12 DIAGNOSIS — Z6839 Body mass index (BMI) 39.0-39.9, adult: Secondary | ICD-10-CM

## 2022-11-12 DIAGNOSIS — E782 Mixed hyperlipidemia: Secondary | ICD-10-CM | POA: Diagnosis not present

## 2022-11-12 MED ORDER — AMLODIPINE BESYLATE 2.5 MG PO TABS: 2.5000 mg | ORAL_TABLET | Freq: Every day | ORAL | 0 refills | Status: DC

## 2022-11-12 MED ORDER — ATORVASTATIN CALCIUM 10 MG PO TABS: 10.0000 mg | ORAL_TABLET | Freq: Every day | ORAL | 3 refills | Status: DC

## 2022-11-12 NOTE — Assessment & Plan Note (Signed)
BP Readings from Last 3 Encounters:  11/12/22 (!) 147/65  10/29/22 135/61  10/15/22 (!) 150/88  Blood pressure is elevated in the office today Currently on amlodipine 2.5 mg daily reports that she has not been taking the medication daily Need to take amlodipine 2.5 mg daily as ordered was discussed DASH diet advised engage in regular moderate to vigorous exercises at monitor blood pressure at home blood pressure goal is less than 140/90 follow-up in 3 months

## 2022-11-12 NOTE — Assessment & Plan Note (Addendum)
Wt Readings from Last 3 Encounters:  11/12/22 223 lb 9.6 oz (101.4 kg)  10/29/22 218 lb (98.9 kg)  10/15/22 218 lb 9.6 oz (99.2 kg)   Body mass index is 39.61 kg/m.  Needs to increase intake of whole food consisting mainly of vegetables and protein less carbohydrate drinking at least 64 ounces of water daily, engaging in regular moderate to vigorous 150 minutes with.  Importance of portion control also She is not a candidate for phentermine due to her uncontrolled hypertension, wegovy is still not readily available at the pharmacies, will refer patient to medical weight management

## 2022-11-12 NOTE — Assessment & Plan Note (Addendum)
Lab Results  Component Value Date   CHOL 310 (H) 01/20/2022   HDL 64 01/20/2022   LDLCALC 199 (H) 01/20/2022   TRIG 244 (H) 01/20/2022   CHOLHDL 4.8 (H) 01/20/2022  .  Since lipid panel has been elevated for so long years will go ahead and treat with atorvastatin '10mg'$  daily to prevent risk of stroke and MI

## 2022-11-12 NOTE — Progress Notes (Signed)
Established Patient Office Visit  Subjective:  Patient ID: Rebecca [name], female    DOB: 03-17-1956  Age: 67 y.o. MRN: [id number]  CC:  Chief Complaint  Patient presents with   Follow-up    Blood pressure    HPI Rebecca [name] is a 67 y.o. female with past medical history of vitamin D deficiency, hypertension, obesity, hyperlipidemia, PTSD, prediabetes presents for follow-up for hypertension.  Hypertension.  Currently on amlodipine 2.5 mg daily patient reports that she has been skipping doses of amlodipine on some days.  She does not measure her blood pressure at home she currently denies headaches, dizziness, edema.    Obesity.  She is interested in lossing weight, stated that she was able to lose weight in the past when she attended a weight management program her current insurance does not cover such program. she has not been exercising reports that she could do better with her diet .   She was encouraged to consider  getting her flu vaccine and shingles vaccines   Has upcoming mammogram    .   Past Medical History:  Diagnosis Date   Hyperlipidemia    Neck pain    Obesity    Peritonsillar abscess 02/03/2017   Thyroid disease    monitoring a nodule 1/9    Past Surgical History:  Procedure Laterality Date   ABDOMINAL HYSTERECTOMY      Family History  Problem Relation Age of Onset   Cancer Mother        pancreatic cancer   Diabetes Mother    Heart disease Mother        rheumatic heart disease   Hyperlipidemia Mother    COPD Sister    Sleep apnea Sister    Cancer Sister        Lung Cancer   Cancer Brother        lung cancer   Cancer Father        renal, bone   Diabetes Father    Hyperlipidemia Father    Heart attack Maternal Grandfather    Kidney disease Brother    Colon cancer Maternal Aunt     Social History   Socioeconomic History   Marital status: Single    Spouse name: N/A   Number of children: 0   Years of education: 18.5   Highest  education level: Not on file  Occupational History   Occupation: Retired    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   Retired after 30 years, and took another job (commutes to [place] 4 days/week).   Social Determinants of Health   Financial Resource Strain: Low Risk  (08/19/2018)   Overall Financial Resource Strain (CARDIA)    Difficulty of Paying Living Expenses: Not hard at all  Food Insecurity: No  Food Insecurity (08/19/2018)   Hunger Vital Sign    Worried About Running Out of Food in the Last Year: Never true    Ran Out of Food in the Last Year: Never true  Transportation Needs: No Transportation Needs (08/19/2018)   PRAPARE - Hydrologist (Medical): No    Lack of Transportation (Non-Medical): No  Physical Activity: Inactive (08/19/2018)   Exercise Vital Sign    Days of Exercise per Week: 0 days    Minutes of Exercise per Session: 0 min  Stress: Stress Concern Present (08/19/2018)   [place]    Feeling of Stress : Very much  Social Connections: Unknown (08/19/2018)   Social Connection and Isolation Panel [NHANES]    Frequency of Communication with Friends and Family: More than three times a week    Frequency of Social Gatherings with Friends and Family: More than three times a week     Attends Religious Services: More than 4 times per year    Active Member of [employer] or Organizations: Yes    Attends Archivist Meetings: More than 4 times per year    Marital Status: Not on file  Intimate Partner Violence: Not At Risk (08/19/2018)   Humiliation, Afraid, Rape, and Kick questionnaire    Fear of Current or Ex-Partner: No    Emotionally Abused: No    Physically Abused: No    Sexually Abused: No    Outpatient Medications Prior to Visit  Medication Sig Dispense Refill   acetaminophen (TYLENOL) 500 MG tablet Take 1 tablet (500 mg total) by mouth every 6 (six) hours as needed. 30 tablet 0   B Complex Vitamins (B-COMPLEX/B-12 PO) Take by mouth.     cholecalciferol (VITAMIN D) 1000 units tablet Take 1,000 Units by mouth daily.     famotidine (PEPCID) 20 MG tablet Take 20 mg by mouth 2 (two) times daily.     hydrOXYzine (VISTARIL) 25 MG capsule Take 1 capsule (25 mg total) by mouth at bedtime as needed. 30 capsule 0   Omega-3 Fatty Acids (FISH OIL) 1000 MG CAPS Take by mouth.     sertraline (ZOLOFT) 25 MG tablet Take 3 tablets (75 mg total) by mouth daily. 90 tablet 1   amLODipine (NORVASC) 2.5 MG tablet Take 1 tablet (2.5 mg total) by mouth daily. 60 tablet 0   cyclobenzaprine (FLEXERIL) 10 MG tablet Take 1 tablet (10 mg total) by mouth 3 (three) times daily as needed for muscle spasms. (Patient not taking: Reported on 01/20/2022) 30 tablet 3   traZODone (DESYREL) 50 MG tablet Take 0.5-1 tablets (25-50 mg total) by mouth at bedtime as needed for sleep. (Patient not taking: Reported on 07/23/2021) 90 tablet 3   pyridOXINE (VITAMIN B-6) 100 MG tablet Take 100 mg by mouth daily. (Patient not taking: Reported on 11/12/2022)     No facility-administered medications prior to visit.    Allergies  Allergen Reactions   Prednisolone Other (See Comments)    Patient can't remember  Other reaction(s): Delusions (intolerance) Patient can't remember    Prednisone     Other  reaction(s): BAD DREAMS/HALLUCINATIONS    ROS Review of Systems  Constitutional: Negative.  Negative for activity change, appetite change, chills, diaphoresis and fever.  Respiratory: Negative.  Negative for apnea, choking, chest tightness, shortness of breath and stridor.   Cardiovascular: Negative.  Negative for chest pain, palpitations and leg swelling.  Musculoskeletal:  Negative for arthralgias,  back pain and gait problem.  Neurological: Negative.  Negative for dizziness, facial asymmetry, light-headedness, numbness and headaches.  Psychiatric/Behavioral: Negative.  Negative for agitation, behavioral problems, confusion and decreased concentration.       Objective:    Physical Exam Constitutional:      General: She is not in acute distress.    Appearance: She is obese. She is not ill-appearing, toxic-appearing or diaphoretic.  Eyes:     General: No scleral icterus.       Right eye: No discharge.        Left eye: No discharge.     Extraocular Movements: Extraocular movements intact.  Cardiovascular:     Rate and Rhythm: Normal rate and regular rhythm.     Pulses: Normal pulses.     Heart sounds: Normal heart sounds. No murmur heard.    No friction rub. No gallop.  Pulmonary:     Effort: Pulmonary effort is normal. No respiratory distress.     Breath sounds: Normal breath sounds. No stridor. No wheezing, rhonchi or rales.  Chest:     Chest wall: No tenderness.  Abdominal:     General: There is no distension.     Palpations: Abdomen is soft.     Tenderness: There is no abdominal tenderness.  Musculoskeletal:        General: No deformity or signs of injury.     Right lower leg: No edema.     Left lower leg: No edema.  Skin:    Capillary Refill: Capillary refill takes less than 2 seconds.  Neurological:     Mental Status: She is alert and oriented to person, place, and time.     Motor: No weakness.     Coordination: Coordination normal.     Gait: Gait normal.   Psychiatric:        Mood and Affect: Mood normal.        Behavior: Behavior normal.        Thought Content: Thought content normal.        Judgment: Judgment normal.     BP (!) 147/65   Pulse 79   Temp 97.7 F (36.5 C)   Wt 223 lb 9.6 oz (101.4 kg)   SpO2 99%   BMI 39.61 kg/m  Wt Readings from Last 3 Encounters:  11/12/22 223 lb 9.6 oz (101.4 kg)  10/29/22 218 lb (98.9 kg)  10/15/22 218 lb 9.6 oz (99.2 kg)    Lab Results  Component Value Date   TSH 1.430 10/15/2022   Lab Results  Component Value Date   WBC 9.4 10/15/2022   HGB 13.0 10/15/2022   HCT 37.5 10/15/2022   MCV 91 10/15/2022   PLT 229 10/15/2022   Lab Results  Component Value Date   NA 140 10/15/2022   K 4.7 10/15/2022   CO2 21 10/15/2022   GLUCOSE 96 10/15/2022   BUN 10 10/15/2022   CREATININE 0.67 10/15/2022   BILITOT <0.2 10/15/2022   ALKPHOS 99 10/15/2022   AST 24 10/15/2022   ALT 26 10/15/2022   PROT 6.9 10/15/2022   ALBUMIN 4.3 10/15/2022   CALCIUM 9.4 10/15/2022   EGFR 98 10/15/2022   Lab Results  Component Value Date   CHOL 310 (H) 01/20/2022   Lab Results  Component Value Date   HDL 64 01/20/2022   Lab Results  Component Value Date   LDLCALC 199 (H) 01/20/2022   Lab Results  Component Value Date   TRIG 244 (H) 01/20/2022  Lab Results  Component Value Date   CHOLHDL 4.8 (H) 01/20/2022   Lab Results  Component Value Date   HGBA1C 5.6 10/15/2022      Assessment & Plan:   Problem List Items Addressed This Visit       Cardiovascular and Mediastinum   Essential hypertension - Primary    BP Readings from Last 3 Encounters:  11/12/22 (!) 147/65  10/29/22 135/61  10/15/22 (!) 150/88  Blood pressure is elevated in the office today Currently on amlodipine 2.5 mg daily reports that she has not been taking the medication daily Need to take amlodipine 2.5 mg daily as ordered was discussed DASH diet advised engage in regular moderate to vigorous exercises at monitor  blood pressure at home blood pressure goal is less than 140/90 follow-up in 3 months      Relevant Medications   amLODipine (NORVASC) 2.5 MG tablet   atorvastatin (LIPITOR) 10 MG tablet     Other   Hyperlipidemia (Chronic)    Lab Results  Component Value Date   CHOL 310 (H) 01/20/2022   HDL 64 01/20/2022   LDLCALC 199 (H) 01/20/2022   TRIG 244 (H) 01/20/2022   CHOLHDL 4.8 (H) 01/20/2022  .  Since lipid panel has been elevated for so long years will go ahead and treat with atorvastatin '10mg'$  daily to prevent risk of stroke and MI      Relevant Medications   amLODipine (NORVASC) 2.5 MG tablet   atorvastatin (LIPITOR) 10 MG tablet   Other Relevant Orders   Lipid Panel   Obesity, unspecified    Wt Readings from Last 3 Encounters:  11/12/22 223 lb 9.6 oz (101.4 kg)  10/29/22 218 lb (98.9 kg)  10/15/22 218 lb 9.6 oz (99.2 kg)   Body mass index is 39.61 kg/m.  Needs to increase intake of whole food consisting mainly of vegetables and protein less carbohydrate drinking at least 64 ounces of water daily, engaging in regular moderate to vigorous 150 minutes with.  Importance of portion control also She is not a candidate for phentermine due to her uncontrolled hypertension, wegovy is still not readily available at the pharmacies, will refer patient to medical weight management      Relevant Orders   Amb Ref to Medical Weight Management    Meds ordered this encounter  Medications   amLODipine (NORVASC) 2.5 MG tablet    Sig: Take 1 tablet (2.5 mg total) by mouth daily.    Dispense:  60 tablet    Refill:  0   atorvastatin (LIPITOR) 10 MG tablet    Sig: Take 1 tablet (10 mg total) by mouth daily.    Dispense:  90 tablet    Refill:  3    Follow-up: Return in about 3 months (around 02/11/2023) for HTN.    Renee Rival, FNP

## 2022-11-12 NOTE — Patient Instructions (Signed)
Around 3 times per week, check your blood pressure 2 times per day. once in the morning and once in the evening. The readings should be at least one minute apart. Write down these values and bring them to your next nurse visit/appointment.  When you check your BP, make sure you have been doing something calm/relaxing 5 minutes prior to checking. Both feet should be flat on the floor and you should be sitting. Use your left arm and make sure it is in a relaxed position (on a table), and that the cuff is at the approximate level/height of your heart.   Blood pressure goal is less than 140/90.   It is important that you exercise regularly at least 30 minutes 5 times a week as tolerated  Think about what you will eat, plan ahead. Choose " clean, green, fresh or frozen" over canned, processed or packaged foods which are more sugary, salty and fatty. 70 to 75% of food eaten should be vegetables and fruit. Three meals at set times with snacks allowed between meals, but they must be fruit or vegetables. Aim to eat over a 12 hour period , example 7 am to 7 pm, and STOP after  your last meal of the day. Drink water,generally about 64 ounces per day, no other drink is as healthy. Fruit juice is best enjoyed in a healthy way, by EATING the fruit.  Thanks for choosing [facility] we consider it a privelige to serve you.

## 2022-12-23 ENCOUNTER — Ambulatory Visit (INDEPENDENT_AMBULATORY_CARE_PROVIDER_SITE_OTHER): Payer: Medicare Other

## 2022-12-23 VITALS — Ht 63.0 in | Wt 223.0 lb

## 2022-12-23 DIAGNOSIS — Z0000 Encounter for general adult medical examination without abnormal findings: Secondary | ICD-10-CM | POA: Diagnosis not present

## 2022-12-23 NOTE — Patient Instructions (Addendum)
Rebecca [name] , Thank you for taking time to come for your Medicare Wellness Visit. I appreciate your ongoing commitment to your health goals. Please review the following plan we discussed and let me know if I can assist you in the future.   These are the goals we discussed:  Goals       Increase physical activity (pt-stated)      Lose at least 50-75 pounds.        This is a list of the screening recommended for you and due dates:  Health Maintenance  Topic Date Due   Eye exam for diabetics  12/23/2022*   Yearly kidney health urinalysis for diabetes  12/24/2022*   Flu Shot  02/06/2023*   Zoster (Shingles) Vaccine (1 of 2) 03/23/2023*   COVID-19 Vaccine (4 - 2023-24 season) 10/09/2023*   HIV Screening  10/16/2023*   Mammogram  12/24/2023*   Hepatitis C Screening: USPSTF Recommendation to screen - Ages 18-79 yo.  12/24/2023*   Hemoglobin A1C  04/16/2023   DTaP/Tdap/Td vaccine (2 - Tdap) 04/28/2023   Yearly kidney function blood test for diabetes  10/16/2023   Complete foot exam   10/16/2023   Colon Cancer Screening  11/17/2023   Medicare Annual Wellness Visit  12/24/2023   HPV Vaccine  Aged Out  *Topic was postponed. The date shown is not the original due date.    Advanced directives: Advance directive discussed with you today. Even though you declined this today, please call our office should you change your mind, and we can give you the proper paperwork for you to fill out.   Conditions/risks identified: None  Next appointment: Follow up in one year for your annual wellness visit.    Preventive Care 40-64 Years, Female Preventive care refers to lifestyle choices and visits with your health care provider that can promote health and wellness. What does preventive care include? A yearly physical exam. This is also called an annual well check. Dental exams once or twice a year. Routine eye exams. Ask your health care provider how often you should have your eyes  checked. Personal lifestyle choices, including: Daily care of your teeth and gums. Regular physical activity. Eating a healthy diet. Avoiding tobacco and drug use. Limiting alcohol use. Practicing safe sex. Taking low-dose aspirin daily starting at age 50. Taking vitamin and mineral supplements as recommended by your health care provider. What happens during an annual well check? The services and screenings done by your health care provider during your annual well check will depend on your age, overall health, lifestyle risk factors, and family history of disease. Counseling  Your health care provider may ask you questions about your: Alcohol use. Tobacco use. Drug use. Emotional well-being. Home and relationship well-being. Sexual activity. Eating habits. Work and work Statistician. Method of birth control. Menstrual cycle. Pregnancy history. Screening  You may have the following tests or measurements: Height, weight, and BMI. Blood pressure. Lipid and cholesterol levels. These may be checked every 5 years, or more frequently if you are over 30 years old. Skin check. Lung cancer screening. You may have this screening every year starting at age 33 if you have a 30-pack-year history of smoking and currently smoke or have quit within the past 15 years. Fecal occult blood test (FOBT) of the stool. You may have this test every year starting at age 25. Flexible sigmoidoscopy or colonoscopy. You may have a sigmoidoscopy every 5 years or a colonoscopy every 10 years starting at age 48.  Hepatitis C blood test. Hepatitis B blood test. Sexually transmitted disease (STD) testing. Diabetes screening. This is done by checking your blood sugar (glucose) after you have not eaten for a while (fasting). You may have this done every 1-3 years. Mammogram. This may be done every 1-2 years. Talk to your health care provider about when you should start having regular mammograms. This may depend on  whether you have a family history of breast cancer. BRCA-related cancer screening. This may be done if you have a family history of breast, ovarian, tubal, or peritoneal cancers. Pelvic exam and Pap test. This may be done every 3 years starting at age 22. Starting at age 90, this may be done every 5 years if you have a Pap test in combination with an HPV test. Bone density scan. This is done to screen for osteoporosis. You may have this scan if you are at high risk for osteoporosis. Discuss your test results, treatment options, and if necessary, the need for more tests with your health care provider. Vaccines  Your health care provider may recommend certain vaccines, such as: Influenza vaccine. This is recommended every year. Tetanus, diphtheria, and acellular pertussis (Tdap, Td) vaccine. You may need a Td booster every 10 years. Zoster vaccine. You may need this after age 29. Pneumococcal 13-valent conjugate (PCV13) vaccine. You may need this if you have certain conditions and were not previously vaccinated. Pneumococcal polysaccharide (PPSV23) vaccine. You may need one or two doses if you smoke cigarettes or if you have certain conditions. Talk to your health care provider about which screenings and vaccines you need and how often you need them. This information is not intended to replace advice given to you by your health care provider. Make sure you discuss any questions you have with your health care provider. Document Released: 11/21/2015 Document Revised: 07/14/2016 Document Reviewed: 08/26/2015 Elsevier Interactive Patient Education  2017 [place] Prevention in the Home Falls can cause injuries. They can happen to people of all ages. There are many things you can do to make your home safe and to help prevent falls. What can I do on the outside of my home? Regularly fix the edges of walkways and driveways and fix any cracks. Remove anything that might make you trip as you  walk through a door, such as a raised step or threshold. Trim any bushes or trees on the path to your home. Use bright outdoor lighting. Clear any walking paths of anything that might make someone trip, such as rocks or tools. Regularly check to see if handrails are loose or broken. Make sure that both sides of any steps have handrails. Any raised decks and porches should have guardrails on the edges. Have any leaves, snow, or ice cleared regularly. Use sand or salt on walking paths during winter. Clean up any spills in your garage right away. This includes oil or grease spills. What can I do in the bathroom? Use night lights. Install grab bars by the toilet and in the tub and shower. Do not use towel bars as grab bars. Use non-skid mats or decals in the tub or shower. If you need to sit down in the shower, use a plastic, non-slip stool. Keep the floor dry. Clean up any water that spills on the floor as soon as it happens. Remove soap buildup in the tub or shower regularly. Attach bath mats securely with double-sided non-slip rug tape. Do not have throw rugs and other things  on the floor that can make you trip. What can I do in the bedroom? Use night lights. Make sure that you have a light by your bed that is easy to reach. Do not use any sheets or blankets that are too big for your bed. They should not hang down onto the floor. Have a firm chair that has side arms. You can use this for support while you get dressed. Do not have throw rugs and other things on the floor that can make you trip. What can I do in the kitchen? Clean up any spills right away. Avoid walking on wet floors. Keep items that you use a lot in easy-to-reach places. If you need to reach something above you, use a strong step stool that has a grab bar. Keep electrical cords out of the way. Do not use floor polish or wax that makes floors slippery. If you must use wax, use non-skid floor wax. Do not have throw rugs  and other things on the floor that can make you trip. What can I do with my stairs? Do not leave any items on the stairs. Make sure that there are handrails on both sides of the stairs and use them. Fix handrails that are broken or loose. Make sure that handrails are as long as the stairways. Check any carpeting to make sure that it is firmly attached to the stairs. Fix any carpet that is loose or worn. Avoid having throw rugs at the top or bottom of the stairs. If you do have throw rugs, attach them to the floor with carpet tape. Make sure that you have a light switch at the top of the stairs and the bottom of the stairs. If you do not have them, ask someone to add them for you. What else can I do to help prevent falls? Wear shoes that: Do not have high heels. Have rubber bottoms. Are comfortable and fit you well. Are closed at the toe. Do not wear sandals. If you use a stepladder: Make sure that it is fully opened. Do not climb a closed stepladder. Make sure that both sides of the stepladder are locked into place. Ask someone to hold it for you, if possible. Clearly mark and make sure that you can see: Any grab bars or handrails. First and last steps. Where the edge of each step is. Use tools that help you move around (mobility aids) if they are needed. These include: Canes. Walkers. Scooters. Crutches. Turn on the lights when you go into a dark area. Replace any light bulbs as soon as they burn out. Set up your furniture so you have a clear path. Avoid moving your furniture around. If any of your floors are uneven, fix them. If there are any pets around you, be aware of where they are. Review your medicines with your doctor. Some medicines can make you feel dizzy. This can increase your chance of falling. Ask your doctor what other things that you can do to help prevent falls. This information is not intended to replace advice given to you by your health care provider. Make sure  you discuss any questions you have with your health care provider. Document Released: 08/21/2009 Document Revised: 04/01/2016 Document Reviewed: 11/29/2014 Elsevier Interactive Patient Education  2017 [employer].

## 2022-12-23 NOTE — Progress Notes (Signed)
Subjective:   Rebecca [name] is a 67 y.o. female who presents for Medicare Annual (Subsequent) preventive examination.  Review of Systems    Virtual Visit via Telephone Note  I connected with  Rebecca [name] on 12/23/22 at  3:45 PM EST by telephone and verified that I am speaking with the correct person using two identifiers.  Location: Patient: Home Provider: Office Persons participating in the virtual visit: patient/Nurse Health Advisor   I discussed the limitations, risks, security and privacy concerns of performing an evaluation and management service by telephone and the availability of in person appointments. The patient expressed understanding and agreed to proceed.  Interactive audio and video telecommunications were attempted between this nurse and patient, however failed, due to patient having technical difficulties OR patient did not have access to video capability.  We continued and completed visit with audio only.  Some vital signs may be absent or patient reported.   Criselda Peaches, LPN  Cardiac Risk Factors include: advanced age (>11mn, >>28women);hypertension     Objective:    Today's Vitals   12/23/22 1551  Weight: 223 lb (101.2 kg)  Height: 5' 3"$  (1.6 m)   Body mass index is 39.5 kg/m.     12/23/2022    4:00 PM 03/30/2018    1:23 PM 03/15/2018    8:59 PM  Advanced Directives  Does Patient Have a Medical Advance Directive? No No No  Would patient like information on creating a medical advance directive? No - Patient declined      Current Medications (verified) Outpatient Encounter Medications as of 12/23/2022  Medication Sig   acetaminophen (TYLENOL) 500 MG tablet Take 1 tablet (500 mg total) by mouth every 6 (six) hours as needed.   amLODipine (NORVASC) 2.5 MG tablet Take 1 tablet (2.5 mg total) by mouth daily.   atorvastatin (LIPITOR) 10 MG tablet Take 1 tablet (10 mg total) by mouth daily.   B Complex Vitamins (B-COMPLEX/B-12 PO) Take by  mouth.   cholecalciferol (VITAMIN D) 1000 units tablet Take 1,000 Units by mouth daily.   cyclobenzaprine (FLEXERIL) 10 MG tablet Take 1 tablet (10 mg total) by mouth 3 (three) times daily as needed for muscle spasms. (Patient not taking: Reported on 01/20/2022)   famotidine (PEPCID) 20 MG tablet Take 20 mg by mouth 2 (two) times daily.   hydrOXYzine (VISTARIL) 25 MG capsule Take 1 capsule (25 mg total) by mouth at bedtime as needed.   Omega-3 Fatty Acids (FISH OIL) 1000 MG CAPS Take by mouth.   sertraline (ZOLOFT) 25 MG tablet Take 3 tablets (75 mg total) by mouth daily.   traZODone (DESYREL) 50 MG tablet Take 0.5-1 tablets (25-50 mg total) by mouth at bedtime as needed for sleep. (Patient not taking: Reported on 07/23/2021)   [DISCONTINUED] escitalopram (LEXAPRO) 10 MG tablet Take 1 tablet (10 mg total) by mouth daily. (Patient not taking: Reported on 08/19/2018)   No facility-administered encounter medications on file as of 12/23/2022.    Allergies (verified) Prednisolone and Prednisone   History: Past Medical History:  Diagnosis Date   Hyperlipidemia    Neck pain    Obesity    Peritonsillar abscess 02/03/2017   Thyroid disease    monitoring a nodule 1/9   Past Surgical History:  Procedure Laterality Date   ABDOMINAL HYSTERECTOMY     Family History  Problem Relation Age of Onset   Cancer Mother        pancreatic cancer   Diabetes Mother  Heart disease Mother        rheumatic heart disease   Hyperlipidemia Mother    COPD Sister    Sleep apnea Sister    Cancer Sister        Lung Cancer   Cancer Brother        lung cancer   Cancer Father        renal, bone   Diabetes Father    Hyperlipidemia Father    Heart attack Maternal Grandfather    Kidney disease Brother    Colon cancer Maternal Aunt    Social History   Socioeconomic History   Marital status: Single    Spouse name: N/A   Number of children: 0   Years of education: 18.5   Highest education level: Not on  file  Occupational History   Occupation: Retired    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   Retired after 30 years, and took another job (commutes to [place] 4 days/week).   Social Determinants of Health   Financial Resource Strain: Low Risk  (12/23/2022)   Overall Financial Resource Strain (CARDIA)    Difficulty of Paying Living Expenses: Not hard at all  Food Insecurity: No Food Insecurity (12/23/2022)   Hunger Vital Sign    Worried About Running Out of Food in the Last Year: Never true    Ran Out of Food in the Last Year: Never true  Transportation Needs: No Transportation Needs (12/23/2022)   PRAPARE - Hydrologist (Medical): No    Lack of Transportation (Non-Medical): No  Physical Activity: Sufficiently Active (12/23/2022)   Exercise Vital Sign    Days of Exercise per Week: 3 days    Minutes of Exercise per Session: 60 min  Stress: No Stress Concern Present (12/23/2022)   [place]    Feeling of Stress : Not at all  Social Connections: Moderately Isolated (12/23/2022)   Social Connection and Isolation Panel [NHANES]    Frequency of Communication with Friends and Family: More than three times a week    Frequency of Social Gatherings with Friends and Family: More than three times a week     Attends Religious Services: Never    Marine scientist or Organizations: Yes    Attends Music therapist: More than 4 times per year    Marital Status: Never married    Tobacco Counseling Counseling given: Not Answered   Clinical Intake:  Pre-visit preparation completed: Yes  Pain : No/denies pain     BMI - recorded: 39.5 Nutritional Status: BMI > 30  Obese Nutritional Risks: None Diabetes: No  How often do you need to have someone help you when you read instructions, pamphlets, or  other written materials from your doctor or pharmacy?: 1 - Never  Diabetic?  No  Interpreter Needed?: No  Information entered by :: Rolene Arbour LPN   Activities of Daily Living    12/23/2022    3:58 PM  In your present state of health, do you have any difficulty performing the following activities:  Hearing? 0  Vision? 0  Difficulty concentrating or making decisions? 0  Walking or climbing stairs? 0  Dressing or bathing? 0  Doing errands, shopping? 0  Preparing Food and eating ? N  Using the Toilet? N  In the past six months, have you accidently leaked urine? N  Do you have problems with loss of bowel control? N  Managing your Medications? N  Managing your Finances? N  Housekeeping or managing your Housekeeping? N    Patient Care Team: Renee Rival, FNP as PCP - General (Nurse Practitioner)  Indicate any recent Medical Services you may have received from other than [facility] providers in the past year (date may be approximate).     Assessment:   This is a routine wellness examination for Rebecca [name].  Hearing/Vision screen Hearing Screening - Comments:: Denies hearing difficulties   Vision Screening - Comments:: Wears reading glasses - up to date with routine eye exams with  Patient deferred  Dietary issues and exercise activities discussed: Exercise limited by: None identified   Goals Addressed               This Visit's Progress     Increase  physical activity (pt-stated)        Lose at least 50-75 pounds.       Depression Screen    12/23/2022    3:55 PM 11/12/2022    2:44 PM 01/20/2022    9:36 AM 07/23/2021    8:56 AM 07/24/2019   10:15 AM 11/24/2018    9:02 AM 09/29/2018    8:07 AM  PHQ 2/9 Scores  PHQ - 2 Score 0 0 1 1 0 1 0  PHQ- 9 Score   6        Fall Risk    12/23/2022    3:58 PM 01/20/2022    9:33 AM 07/23/2021    8:56 AM 10/23/2019   11:01 AM 07/24/2019   10:14 AM  [place] in the past year? 0 0 0 0 0  Number falls in past yr: 0 0 0 0 0  Injury with Fall? 0 0 0 0 0  Risk for fall due to : No Fall Risks No Fall Risks     Follow up Falls prevention discussed Falls evaluation completed       FALL RISK PREVENTION PERTAINING TO THE HOME:  Any stairs in or around the home? Yes  If so, are there any without handrails? No  Home free of loose throw rugs in walkways, pet beds, electrical cords, etc? Yes  Adequate lighting in your home to reduce risk of falls? Yes   ASSISTIVE DEVICES UTILIZED TO PREVENT FALLS:  Life alert? No  Use of a cane, walker or w/c? No  Grab bars in the bathroom? No  Shower chair or bench in shower? Yes  Elevated toilet seat or a handicapped toilet? Yes   TIMED UP AND GO:  Was the test performed? No . Audio Visit   Cognitive Function:        12/23/2022    4:00 PM  6CIT Screen  What Year? 0 points  What month? 0  points  What time? 0 points  Count back from 20 0 points  Months in reverse 0 points  Repeat phrase 0 points  Total Score 0 points    Immunizations Immunization History  Administered Date(s) Administered   Influenza Split 08/10/2012   Moderna Sars-Covid-2 Vaccination 01/13/2020, 02/14/2020, 09/16/2020   Td 04/27/2013    TDAP status: Up to date  Flu Vaccine status: Due, Education has been provided regarding the importance of this vaccine. Advised may receive this vaccine at local pharmacy or Health Dept. Aware to provide a copy of the vaccination  record if obtained from local pharmacy or Health Dept. Verbalized acceptance and understanding.    Covid-19 vaccine status: Completed vaccines  Qualifies for Shingles Vaccine? Yes   Zostavax completed No   Shingrix Completed?: No.    Education has been provided regarding the importance of this vaccine. Patient has been advised to call insurance company to determine out of pocket expense if they have not yet received this vaccine. Advised may also receive vaccine at local pharmacy or Health Dept. Verbalized acceptance and understanding.  Screening Tests Health Maintenance  Topic Date Due   OPHTHALMOLOGY EXAM  12/23/2022 (Originally 07/08/1966)   Diabetic kidney evaluation - Urine ACR  12/24/2022 (Originally 07/08/1974)   INFLUENZA VACCINE  02/06/2023 (Originally 06/08/2022)   Zoster Vaccines- Shingrix (1 of 2) 03/23/2023 (Originally 07/08/2008)   COVID-19 Vaccine (4 - 2023-24 season) 10/09/2023 (Originally 07/09/2022)   HIV Screening  10/16/2023 (Originally 07/08/1973)   MAMMOGRAM  12/24/2023 (Originally 12/25/2018)   Hepatitis C Screening  12/24/2023 (Originally 07/08/1974)   HEMOGLOBIN A1C  04/16/2023   DTaP/Tdap/Td (2 - Tdap) 04/28/2023   Diabetic kidney evaluation - eGFR measurement  10/16/2023   FOOT EXAM  10/16/2023   COLONOSCOPY (Pts 45-57yr Insurance coverage will need to be confirmed)  11/17/2023   Medicare Annual Wellness (AWV)  12/24/2023   HPV VACCINES  Aged Out    Health Maintenance  There are no preventive care reminders to display for this patient.   Colorectal cancer screening: Type of screening: Colonoscopy. Completed 11/26/13. Repeat every 10 years  Mammogram status: Ordered Patient deferred. Pt provided with contact info and advised to call to schedule appt.     Lung Cancer Screening: (Low Dose CT Chest recommended if Age 65-80years, 30 pack-year currently smoking OR have quit w/in 15years.) does not qualify.     Additional Screening:  Hepatitis C Screening:  does qualify; Deferred  Vision Screening: Recommended annual ophthalmology exams for early detection of glaucoma and other disorders of the eye. Is the patient up to date with their annual eye exam?  No  Who is the provider or what is the name of the office in which the patient attends annual eye exams? Patient deferred If pt is not established with a provider, would they like to be referred to a provider to establish care? No .   Dental Screening: Recommended annual dental exams for proper oral hygiene  Community Resource Referral / Chronic Care Management:  CRR required this visit?  No   CCM required this visit?  No      Plan:     I have personally reviewed and noted the following in the patient's chart:   Medical and social history Use of alcohol, tobacco or illicit drugs  Current medications and supplements including opioid prescriptions. Patient is not currently taking opioid prescriptions. Functional ability and status Nutritional status Physical activity Advanced directives List of other physicians Hospitalizations, surgeries, and ER visits in  previous 12 months Vitals Screenings to include cognitive, depression, and falls Referrals and appointments  In addition, I have reviewed and discussed with patient certain preventive protocols, quality metrics, and best practice recommendations. A written personalized care plan for preventive services as well as general preventive health recommendations were provided to patient.     Criselda Peaches, LPN   [id number]   Nurse Notes: Patient due Diabetic kidney evaluation-Urine ACR and Hep-C Screening

## 2022-12-30 LAB — HM MAMMOGRAPHY

## 2023-01-19 ENCOUNTER — Encounter (INDEPENDENT_AMBULATORY_CARE_PROVIDER_SITE_OTHER): Payer: Self-pay | Admitting: Family Medicine

## 2023-01-19 ENCOUNTER — Ambulatory Visit (INDEPENDENT_AMBULATORY_CARE_PROVIDER_SITE_OTHER): Payer: Medicare Other | Admitting: Family Medicine

## 2023-01-19 VITALS — BP 153/84 | HR 86 | Temp 98.2°F | Ht 63.0 in | Wt 220.0 lb

## 2023-01-19 DIAGNOSIS — Z6838 Body mass index (BMI) 38.0-38.9, adult: Secondary | ICD-10-CM

## 2023-01-19 DIAGNOSIS — Z0289 Encounter for other administrative examinations: Secondary | ICD-10-CM

## 2023-01-19 DIAGNOSIS — E785 Hyperlipidemia, unspecified: Secondary | ICD-10-CM | POA: Diagnosis not present

## 2023-01-19 DIAGNOSIS — R7303 Prediabetes: Secondary | ICD-10-CM | POA: Diagnosis not present

## 2023-01-19 DIAGNOSIS — I10 Essential (primary) hypertension: Secondary | ICD-10-CM | POA: Diagnosis not present

## 2023-01-19 NOTE — Assessment & Plan Note (Signed)
Blood pressure is elevated today.  She reports good compliance with use of amlodipine 2.5 mg once daily.  She is encouraged to monitor her blood pressures at home and plans to obtain a blood pressure cuff from the pharmacy.  Look for improvements in blood pressure with weight reduction.

## 2023-01-19 NOTE — Assessment & Plan Note (Signed)
Lab Results  Component Value Date   HGBA1C 5.6 10/15/2022   Patient has a history of prediabetes currently not on any medication.  She has started to reduce her intake of added sugar.  Look for improvements in A1c and fasting insulin with weight reduction and dietary change.  Encouraged regular walking 30 minutes at least 3 times a week.

## 2023-01-19 NOTE — Progress Notes (Signed)
Office: [phone]  /  Fax: [phone]   Initial Visit  Rebecca [name] was seen in clinic today to evaluate for obesity. She is interested in losing weight to improve overall health and reduce the risk of weight related complications. She presents today to review program treatment options, initial physical assessment, and evaluation.     She was referred by: PCP  When asked what else they would like to accomplish? She states: Improve existing medical conditions, Reduce number of medications, and Improve quality of life  she would like to be 170 lb  Weight history: was overweight/ athletic in childhood.  Steadily gained weight thru adulthood and has lost weight with [employer] or [employer].  She has not been able to get below 190 lb.    When asked how has your weight affected you? She states: Contributed to medical problems, Contributed to orthopedic problems or mobility issues, Having fatigue, Having poor endurance, Problems with eating patterns, and Problems with depression and or anxiety  Some associated conditions: Hypertension, Hyperlipidemia, OSA, Prediabetes, and Vitamin D Deficiency  Contributing factors: Family history, Stress, Reduced physical activity, Eating patterns, and Mental health problems no longer in counseling; has a good support system  Weight promoting medications identified: None  Current nutrition plan: None  Current level of physical activity: Walking  Current or previous pharmacotherapy: Phentermine  Response to medication: Lost weight initially but was unable to sustain weight loss   Past medical history includes:   Past Medical History:  Diagnosis Date   Hyperlipidemia    Neck pain    Obesity    Peritonsillar abscess 02/03/2017   Thyroid disease    monitoring a nodule 1/9     Objective:   BP (!) 153/84   Pulse 86   Temp 98.2 F (36.8 C)   Ht '5\' 3"'$  (1.6 m)   Wt 220 lb (99.8 kg)   SpO2 98%   BMI 38.97 kg/m  She was weighed on the  bioimpedance scale: Body mass index is 38.97 kg/m.  Peak Weight:232 lb , Body Fat%:46.9, Visceral Fat Rating:15, Weight trend over the last 12 months: Increasing  General:  Alert, oriented and cooperative. Patient is in no acute distress.  Respiratory: Normal respiratory effort, no problems with respiration noted   Gait: able to ambulate independently  Mental Status: Normal mood and affect. Normal behavior. Normal judgment and thought content.   DIAGNOSTIC DATA REVIEWED:  BMET    Component Value Date/Time   NA 140 10/15/2022 1625   K 4.7 10/15/2022 1625   CL 103 10/15/2022 1625   CO2 21 10/15/2022 1625   GLUCOSE 96 10/15/2022 1625   GLUCOSE 94 05/09/2017 0902   BUN 10 10/15/2022 1625   CREATININE 0.67 10/15/2022 1625   CREATININE 0.62 05/09/2017 0902   CALCIUM 9.4 10/15/2022 1625   GFRNONAA 89 04/22/2020 1313   GFRNONAA >89 05/09/2017 0902   GFRAA 103 04/22/2020 1313   GFRAA >89 05/09/2017 0902   Lab Results  Component Value Date   HGBA1C 5.6 10/15/2022   HGBA1C 6.2 (H) 06/08/2016   No results found for: "INSULIN" CBC    Component Value Date/Time   WBC 9.4 10/15/2022 1625   WBC 8.8 02/28/2018 1829   WBC 7.9 05/09/2017 0902   RBC 4.13 10/15/2022 1625   RBC 4.81 02/28/2018 1829   RBC 4.31 05/09/2017 0902   HGB 13.0 10/15/2022 1625   HCT 37.5 10/15/2022 1625   PLT 229 10/15/2022 1625   MCV 91 10/15/2022 1625   MCH  31.5 10/15/2022 1625   MCH 29.4 02/28/2018 1829   MCH 29.9 05/09/2017 0902   MCHC 34.7 10/15/2022 1625   MCHC 31.7 (A) 02/28/2018 1829   MCHC 33.0 05/09/2017 0902   RDW 12.5 10/15/2022 1625   Iron/TIBC/Ferritin/ %Sat No results found for: "IRON", "TIBC", "FERRITIN", "IRONPCTSAT" Lipid Panel     Component Value Date/Time   CHOL 310 (H) 01/20/2022 1536   TRIG 244 (H) 01/20/2022 1536   HDL 64 01/20/2022 1536   CHOLHDL 4.8 (H) 01/20/2022 1536   CHOLHDL 3.8 05/09/2017 0902   VLDL 28 05/09/2017 0902   LDLCALC 199 (H) 01/20/2022 1536   Hepatic  Function Panel     Component Value Date/Time   PROT 6.9 10/15/2022 1625   ALBUMIN 4.3 10/15/2022 1625   AST 24 10/15/2022 1625   ALT 26 10/15/2022 1625   ALKPHOS 99 10/15/2022 1625   BILITOT <0.2 10/15/2022 1625      Component Value Date/Time   TSH 1.430 10/15/2022 1625     Assessment and Plan:   Essential hypertension Assessment & Plan: Blood pressure is elevated today.  She reports good compliance with use of amlodipine 2.5 mg once daily.  She is encouraged to monitor her blood pressures at home and plans to obtain a blood pressure cuff from the pharmacy.  Look for improvements in blood pressure with weight reduction.   Morbid obesity ([place]) Assessment & Plan: Reviewed program information, current bioimpedance results and reviewed her weight history.  Will plan to obtain fasting labs, EKG and indirect calorimetry next visit.   BMI 38.0-38.9,adult  Hyperlipidemia, unspecified hyperlipidemia type Assessment & Plan: Lab Results  Component Value Date   CHOL 310 (H) 01/20/2022   HDL 64 01/20/2022   LDLCALC 199 (H) 01/20/2022   TRIG 244 (H) 01/20/2022   CHOLHDL 4.8 (H) 01/20/2022   Cholesterol is very high March 2023.  She is tolerating Lipitor 10 mg once daily without adverse side effect.  She denies myalgias.  Plan to obtain fasting lipid panel with upcoming labs.   Prediabetes Assessment & Plan: Lab Results  Component Value Date   HGBA1C 5.6 10/15/2022   Patient has a history of prediabetes currently not on any medication.  She has started to reduce her intake of added sugar.  Look for improvements in A1c and fasting insulin with weight reduction and dietary change.  Encouraged regular walking 30 minutes at least 3 times a week.         Obesity Treatment / Action Plan:  Patient will work on garnering support from family and friends to begin weight loss journey. Will work on eliminating or reducing the presence of highly palatable, calorie dense foods in  the home. Will complete provided nutritional and psychosocial assessment questionnaire before the next appointment. Will be scheduled for indirect calorimetry to determine resting energy expenditure in a fasting state.  This will allow [place] to create a reduced calorie, high-protein meal plan to promote loss of fat mass while preserving muscle mass. Will reduce liquid calories and sugary drinks from diet. Will work on managing stress via relaxation methods as this may result in unhealthy eating patterns. Will work on reading labels, making healthier choices and watching portion sizes.  Obesity Education Performed Today:  She was weighed on the bioimpedance scale and results were discussed and documented in the synopsis.  We discussed obesity as a disease and the importance of a more detailed evaluation of all the factors contributing to the disease.  We discussed the importance  of long term lifestyle changes which include nutrition, exercise and behavioral modifications as well as the importance of customizing this to her specific health and social needs.  We discussed the benefits of reaching a healthier weight to alleviate the symptoms of existing conditions and reduce the risks of the biomechanical, metabolic and psychological effects of obesity.  Rebecca [name] appears to be in the action stage of change and states they are ready to start intensive lifestyle modifications and behavioral modifications.  30 minutes was spent today on this visit including the above counseling, pre-visit chart review, and post-visit documentation.  Reviewed by clinician on day of visit: allergies, medications, problem list, medical history, surgical history, family history, social history, and previous encounter notes pertinent to obesity diagnosis.    Loyal Gambler, D.O. DABFM, DABOM [facility] [phone] [place], [place] [phone]

## 2023-01-19 NOTE — Assessment & Plan Note (Signed)
Lab Results  Component Value Date   CHOL 310 (H) 01/20/2022   HDL 64 01/20/2022   LDLCALC 199 (H) 01/20/2022   TRIG 244 (H) 01/20/2022   CHOLHDL 4.8 (H) 01/20/2022   Cholesterol is very high March 2023.  She is tolerating Lipitor 10 mg once daily without adverse side effect.  She denies myalgias.  Plan to obtain fasting lipid panel with upcoming labs.

## 2023-01-19 NOTE — Assessment & Plan Note (Signed)
Reviewed program information, current bioimpedance results and reviewed her weight history.  Will plan to obtain fasting labs, EKG and indirect calorimetry next visit.

## 2023-01-31 ENCOUNTER — Encounter: Payer: Self-pay | Admitting: Pharmacist

## 2023-02-02 ENCOUNTER — Encounter (INDEPENDENT_AMBULATORY_CARE_PROVIDER_SITE_OTHER): Payer: Self-pay | Admitting: Internal Medicine

## 2023-02-02 ENCOUNTER — Ambulatory Visit (INDEPENDENT_AMBULATORY_CARE_PROVIDER_SITE_OTHER): Payer: Medicare Other | Admitting: Internal Medicine

## 2023-02-02 VITALS — BP 152/91 | HR 65 | Temp 98.1°F | Ht 63.0 in | Wt 221.0 lb

## 2023-02-02 DIAGNOSIS — R5383 Other fatigue: Secondary | ICD-10-CM

## 2023-02-02 DIAGNOSIS — G4733 Obstructive sleep apnea (adult) (pediatric): Secondary | ICD-10-CM

## 2023-02-02 DIAGNOSIS — I10 Essential (primary) hypertension: Secondary | ICD-10-CM

## 2023-02-02 DIAGNOSIS — E782 Mixed hyperlipidemia: Secondary | ICD-10-CM | POA: Diagnosis not present

## 2023-02-02 DIAGNOSIS — R0602 Shortness of breath: Secondary | ICD-10-CM | POA: Insufficient documentation

## 2023-02-02 DIAGNOSIS — Z1331 Encounter for screening for depression: Secondary | ICD-10-CM

## 2023-02-02 DIAGNOSIS — Z6839 Body mass index (BMI) 39.0-39.9, adult: Secondary | ICD-10-CM

## 2023-02-02 NOTE — Assessment & Plan Note (Addendum)
Blood pressure is uncontrolled.  Repeat blood pressure was 170/90.  She has not been taking her blood pressure medication amlodipine 2.5 mg a day.  She also took TheraFlu last night which has a decongestant.  I counseled patient extensively on the risks associated with uncontrolled blood pressure.  She will purchase a blood pressure monitor, begin monitoring and will work with primary care team on intensifying therapy.  I recommend that she increase her amlodipine to 5 mg a day and to take medication every day.  I recommended that she schedule a follow-up with her PCP.  Losing 10% of body weight may improve condition.  She also has a history of mild OSA and had opted for no CPAP therapy.

## 2023-02-02 NOTE — Assessment & Plan Note (Signed)
LDL is not at goal. Elevated LDL may be secondary to nutrition, genetics and spillover effect from excess adiposity. Recommended LDL goal is <70 to reduce the risk of fatty streaks and the progression to obstructive ASCVD in the future. Her 10 year risk is: The 10-year ASCVD risk score (Arnett DK, et al., 2019) is: 38.3%  Lab Results  Component Value Date   CHOL 310 (H) 01/20/2022   HDL 64 01/20/2022   LDLCALC 199 (H) 01/20/2022   TRIG 244 (H) 01/20/2022   CHOLHDL 4.8 (H) 01/20/2022    She is currently on low intensity statin therapy with atorvastatin 10 mg a day.  She will benefit from moderate to high intensity statin therapy and improve blood pressure control.  We will check fasting lipid profile today.  Continue weight loss therapy, losing 10% or more of body weight may improve condition. Also advised to reduce saturated fats in diet to less than 10% of daily calories.

## 2023-02-02 NOTE — Progress Notes (Unsigned)
Chief Complaint:   OBESITY Rebecca [name] (MR# [id number]) is a 67 y.o. female who presents for evaluation and treatment of obesity and related comorbidities. Current BMI is Body mass index is 39.15 kg/m. Rebecca [name] has been struggling with her weight for many years and has been unsuccessful in either losing weight, maintaining weight loss, or reaching her healthy weight goal.  Rebecca [name] is currently in the action stage of change and ready to dedicate time achieving and maintaining a healthier weight. Rebecca [name] is interested in becoming our patient and working on intensive lifestyle modifications including (but not limited to) diet and exercise for weight loss.  Rebecca [name]'s habits were reviewed today and are as follows: her desired weight loss is 51 lbs, she has been heavy most of her life, she started gaining weight first year of college, her heaviest weight ever was 232 pounds, she snacks frequently in the evenings, she skips meals frequently, she is frequently drinking liquids with calories, she frequently makes poor food choices, she has problems with excessive hunger, and she struggles with emotional eating.  Depression Screen Rebecca [name]'s Food and Mood (modified PHQ-9) score was 11.  Subjective:   1. Other fatigue Rebecca [name] admits to daytime somnolence and admits to waking up still tired. Patient has a history of symptoms of daytime fatigue, morning fatigue, and morning headache. Rebecca [name] generally gets 4 hours of sleep per night, and states that she has nightime awakenings. Snoring is present. Apneic episodes are present. Epworth Sleepiness Score is 6.   2. SOB (shortness of breath) on exertion Rebecca [name] notes increasing shortness of breath with exercising and seems to be worsening over time with weight gain. She notes getting out of breath sooner with activity than she used to. This has not gotten worse recently. Rebecca [name] denies shortness of breath at rest or orthopnea.  3. Essential hypertension Blood  pressure is uncontrolled.  Repeat blood pressure was 170/90.  She has not been taking her blood pressure medication amlodipine 2.5 mg a day.  She also took TheraFlu last night which has a decongestant.    4. Mixed hyperlipidemia LDL is not at goal. Elevated LDL may be secondary to nutrition, genetics and spillover effect from excess adiposity. Recommended LDL goal is <70 to reduce the risk of fatty streaks and the progression to obstructive ASCVD in the future. Her 10 year risk is: The 10-year ASCVD risk score (Arnett DK, et al., 2019) is: 38.3%.  Lab Results  Component Value Date   CHOL 310 (H) 01/20/2022   HDL 64 01/20/2022   LDLCALC 199 (H) 01/20/2022   TRIG 244 (H) 01/20/2022   CHOLHDL 4.8 (H) 01/20/2022   5. OSA (obstructive sleep apnea) Per history she reports having a sleep study in the past and was mild.  She opted out of PAP therapy.    Assessment/Plan:   1. Other fatigue Rebecca [name] does feel that her weight is causing her energy to be lower than it should be. Fatigue may be related to obesity, depression or many other causes. Labs will be ordered, and in the meanwhile, Rebecca [name] will focus on self care including making healthy food choices, increasing physical activity and focusing on stress reduction.  - Vitamin B12  2. SOB (shortness of breath) on exertion Rebecca [name] does feel that she gets out of breath more easily that she used to when she exercises. Rebecca [name]'s shortness of breath appears to be obesity related and exercise induced. She has agreed to work on weight loss and gradually increase exercise to  treat her exercise induced shortness of breath. Will continue to monitor closely.  3. Essential hypertension I counseled patient extensively on the risks associated with uncontrolled blood pressure.  She will purchase a blood pressure monitor, begin monitoring and will work with primary care team on intensifying therapy.  I recommend that she increase her amlodipine to 5 mg a day and to  take medication every day.  I recommended that she schedule a follow-up with her PCP.  Losing 10% of body weight may improve condition.  She also has a history of mild OSA and had opted for no CPAP therapy.  - Comprehensive metabolic panel  4. Mixed hyperlipidemia She is currently on low intensity statin therapy with atorvastatin 10 mg a day.  She will benefit from moderate to high intensity statin therapy and improve blood pressure control.  We will check fasting lipid profile today.  Continue weight loss therapy, losing 10% or more of body weight may improve condition. Also advised to reduce saturated fats in diet to less than 10% of daily calories.    - Lipid Panel With LDL/HDL Ratio  5. OSA (obstructive sleep apnea) I will review records to see if this needs to be repeated.  She does have a lot of symptoms suggestive of sleep disordered breathing and may indeed benefit from PAP therapy.  Losing 15% of body weight may reduce AHI and symptoms.  6. Depression screen Rebecca [name] had a positive depression screening. Depression is commonly associated with obesity and often results in emotional eating behaviors. We will monitor this closely and work on CBT to help improve the non-hunger eating patterns. Referral to Psychology may be required if no improvement is seen as she continues in our clinic.  7. Class 2 severe obesity with serious comorbidity and body mass index (BMI) of 39.0 to 39.9 in adult, unspecified obesity type ([place]) We will check labs today.   - Hemoglobin A1c - Insulin, random - VITAMIN D 25 Hydroxy (Vit-D Deficiency, Fractures)  Rebecca [name] is currently in the action stage of change and her goal is to continue with weight loss efforts. I recommend Rebecca [name] begin the structured treatment plan as follows:  She has agreed to the Category 2 Plan.  Exercise goals: As is.    Behavioral modification strategies: increasing lean protein intake, decreasing simple carbohydrates, increasing  vegetables, increasing water intake, decreasing liquid calories, increasing high fiber foods, no skipping meals, meal planning and cooking strategies, better snacking choices, and planning for success.  She was informed of the importance of frequent follow-up visits to maximize her success with intensive lifestyle modifications for her multiple health conditions. She was informed we would discuss her lab results at her next visit unless there is a critical issue that needs to be addressed sooner. Leisly agreed to keep her next visit at the agreed upon time to discuss these results.  Objective:   Blood pressure (!) 152/91, pulse 65, temperature 98.1 F (36.7 C), height 5\' 3"  (1.6 m), weight 221 lb (100.2 kg), SpO2 98 %. Body mass index is 39.15 kg/m.  EKG: Normal sinus rhythm, rate 66 BPM.  Indirect Calorimeter completed today shows a VO2 of 297 and a REE of 2045.  Her calculated basal metabolic rate is [id number] thus her basal metabolic rate is better than expected.  General: Cooperative, alert, well developed, in no acute distress. HEENT: Conjunctivae and lids unremarkable. Cardiovascular: Regular rhythm.  Lungs: Normal work of breathing. Neurologic: No focal deficits.   Lab Results  Component Value Date  CREATININE 0.64 02/02/2023   BUN 11 02/02/2023   NA 140 02/02/2023   K 4.2 02/02/2023   CL 98 02/02/2023   CO2 27 02/02/2023   Lab Results  Component Value Date   ALT 15 02/02/2023   AST 26 02/02/2023   ALKPHOS 106 02/02/2023   BILITOT 0.5 02/02/2023   Lab Results  Component Value Date   HGBA1C 6.0 (H) 02/02/2023   HGBA1C 5.6 10/15/2022   HGBA1C 5.5 01/20/2022   HGBA1C 5.5 01/20/2022   HGBA1C 5.5 (A) 01/20/2022   HGBA1C 5.5 01/20/2022   Lab Results  Component Value Date   INSULIN 20.5 02/02/2023   Lab Results  Component Value Date   TSH 1.430 10/15/2022   Lab Results  Component Value Date   CHOL 397 (H) 02/02/2023   HDL 63 02/02/2023   LDLCALC 266 (H) 02/02/2023    TRIG 308 (H) 02/02/2023   CHOLHDL 4.8 (H) 01/20/2022   Lab Results  Component Value Date   WBC 9.4 10/15/2022   HGB 13.0 10/15/2022   HCT 37.5 10/15/2022   MCV 91 10/15/2022   PLT 229 10/15/2022   No results found for: "IRON", "TIBC", "FERRITIN"  Attestation Statements:   Reviewed by clinician on day of visit: allergies, medications, problem list, medical history, surgical history, family history, social history, and previous encounter notes.  Time spent on visit including pre-visit chart review and post-visit charting and care was 40 minutes.   Wilhemena Durie, am acting as transcriptionist for Thomes Dinning, MD.  I have reviewed the above documentation for accuracy and completeness, and I agree with the above. -Thomes Dinning, MD

## 2023-02-02 NOTE — Assessment & Plan Note (Signed)
Per history she reports having a sleep study in the past and was mild.  She opted out of PAP therapy.  I will review records to see if this needs to be repeated.  She does have a lot of symptoms suggestive of sleep disordered breathing and may indeed benefit from PAP therapy.  Losing 15% of body weight may reduce AHI and symptoms.

## 2023-02-03 LAB — LIPID PANEL WITH LDL/HDL RATIO
Cholesterol, Total: 397 mg/dL — ABNORMAL HIGH (ref 100–199)
HDL: 63 mg/dL (ref >39)
LDL Chol Calc (NIH): 266 mg/dL — ABNORMAL HIGH (ref 0–99)
LDL/HDL Ratio: 4.2 ratio — ABNORMAL HIGH (ref 0.0–3.2)
Triglycerides: 308 mg/dL — ABNORMAL HIGH (ref 0–149)
VLDL Cholesterol Cal: 68 mg/dL — ABNORMAL HIGH (ref 5–40)

## 2023-02-03 LAB — COMPREHENSIVE METABOLIC PANEL
ALT: 15 IU/L (ref 0–32)
AST: 26 IU/L (ref 0–40)
Albumin/Globulin Ratio: 1.3 (ref 1.2–2.2)
Albumin: 4.6 g/dL (ref 3.9–4.9)
Alkaline Phosphatase: 106 IU/L (ref 44–121)
BUN/Creatinine Ratio: 17 (ref 12–28)
BUN: 11 mg/dL (ref 8–27)
Bilirubin Total: 0.5 mg/dL (ref 0.0–1.2)
CO2: 27 mmol/L (ref 20–29)
Calcium: 9.8 mg/dL (ref 8.7–10.3)
Chloride: 98 mmol/L (ref 96–106)
Creatinine, Ser: 0.64 mg/dL (ref 0.57–1.00)
Globulin, Total: 3.5 g/dL (ref 1.5–4.5)
Glucose: 101 mg/dL — ABNORMAL HIGH (ref 70–99)
Potassium: 4.2 mmol/L (ref 3.5–5.2)
Sodium: 140 mmol/L (ref 134–144)
Total Protein: 8.1 g/dL (ref 6.0–8.5)
eGFR: 99 mL/min/{1.73_m2} (ref >59)

## 2023-02-03 LAB — INSULIN, RANDOM: INSULIN: 20.5 u[IU]/mL (ref 2.6–24.9)

## 2023-02-03 LAB — VITAMIN D 25 HYDROXY (VIT D DEFICIENCY, FRACTURES): Vit D, 25-Hydroxy: 23.1 ng/mL — ABNORMAL LOW (ref 30.0–100.0)

## 2023-02-03 LAB — HEMOGLOBIN A1C
Est. average glucose Bld gHb Est-mCnc: 126 mg/dL
Hgb A1c MFr Bld: 6 % — ABNORMAL HIGH (ref 4.8–5.6)

## 2023-02-03 LAB — VITAMIN B12: Vitamin B-12: 1255 pg/mL — ABNORMAL HIGH (ref 232–1245)

## 2023-02-03 NOTE — Progress Notes (Signed)
The patient is taking hypertensive medications compliantly without side effects.  Denies chest pain, dyspnea, edema, or TIA's. Pt is taking mucinex and thera flu. Per pt the amlodipine makes her stomach upset.   Elyse Jarvis RMA

## 2023-02-11 ENCOUNTER — Ambulatory Visit: Payer: Medicare Other | Admitting: Nurse Practitioner

## 2023-02-16 ENCOUNTER — Ambulatory Visit (INDEPENDENT_AMBULATORY_CARE_PROVIDER_SITE_OTHER): Payer: Medicare Other | Admitting: Internal Medicine

## 2023-02-16 ENCOUNTER — Encounter (INDEPENDENT_AMBULATORY_CARE_PROVIDER_SITE_OTHER): Payer: Self-pay | Admitting: Internal Medicine

## 2023-02-16 VITALS — BP 98/56 | HR 69 | Temp 98.3°F | Ht 63.0 in | Wt 217.0 lb

## 2023-02-16 DIAGNOSIS — E782 Mixed hyperlipidemia: Secondary | ICD-10-CM | POA: Diagnosis not present

## 2023-02-16 DIAGNOSIS — R7303 Prediabetes: Secondary | ICD-10-CM

## 2023-02-16 DIAGNOSIS — Z6839 Body mass index (BMI) 39.0-39.9, adult: Secondary | ICD-10-CM

## 2023-02-16 DIAGNOSIS — I10 Essential (primary) hypertension: Secondary | ICD-10-CM

## 2023-02-16 DIAGNOSIS — E559 Vitamin D deficiency, unspecified: Secondary | ICD-10-CM | POA: Diagnosis not present

## 2023-02-16 NOTE — Progress Notes (Signed)
Office: [phone]  /  Fax: [phone]  WEIGHT SUMMARY AND BIOMETRICS  Vitals Temp: 98.3 F (36.8 C) BP: (!) 98/56 Pulse Rate: 69 SpO2: 96 %   Anthropometric Measurements Height: 5\' 3"  (1.6 m) Weight: 217 lb (98.4 kg) BMI (Calculated): 38.45 Weight at Last Visit: 221 lb Weight Lost Since Last Visit: 4 lb Starting Weight: 221 lb Total Weight Loss (lbs): 4 lb (1.814 kg) Peak Weight: 232 lb   Body Composition  Body Fat %: 46.1 % Fat Mass (lbs): 100.2 lbs Muscle Mass (lbs): 111 lbs Total Body Water (lbs): 79 lbs Visceral Fat Rating : 15      HPI  Chief Complaint: OBESITY  Rebecca [name] is here to discuss her progress with her obesity treatment plan. She is on the the Category 2 Plan and states she is following her eating plan approximately 0 % of the time. She states she is exercising 30 minutes 2 times per week.  Interval History:  Since last office visit she has lost 4 lbs, she has been fasting for religious reasons.  Feels confident about being able to implement the plan She reports has not implemented reduced calorie nutritional plan due to above She has been working on eating more vegetables, drinking more water, and making healthier choices Reports problems with appetite and hunger signals.  Denies problems with satiety and satiation.  Denies problems with eating patterns and portion control.  Denies abnormal cravings. Denies feeling deprived or restricted.   Barriers identified: having difficulty with meal prep and planning and inability to focus on healthy eating.   Pharmacotherapy for weight loss: She is currently taking no anti-obesity medication.    ASSESSMENT AND PLAN  TREATMENT PLAN FOR OBESITY:  Recommended Dietary Goals  Rebecca [name] is currently in the action stage of change. As such, her goal is to continue weight management plan. She has agreed to: continue current plan  Behavioral Intervention  We discussed the following Behavioral  Modification Strategies today: increasing lean protein intake, decreasing simple carbohydrates , increasing vegetables, work on meal planning and preparation, and planning for success.  Additional resources provided today: None  Recommended Physical Activity Goals  Rebecca [name] has been advised to work up to 150 minutes of moderate intensity aerobic activity a week and strengthening exercises 2-3 times per week for cardiovascular health, weight loss maintenance and preservation of muscle mass.   She has agreed to :  Think about ways to increase physical activity  Pharmacotherapy We discussed various medication options to help Rebecca [name] with her weight loss efforts and we both agreed to : continue with nutritional and behavioral strategies  ASSOCIATED CONDITIONS ADDRESSED TODAY  Mixed hyperlipidemia Assessment & Plan: Patient's LDL is markedly elevated.  She reports having family members with heart disease, stroke and diabetes.  Her mom might of had heart problems at a young age but she is not sure.  She does not smoke.  She is currently on moderate intensity statin therapy and would likely require high intensity, plus ezetimibe and or PKS drug.  She may also benefit from genetic testing as this will help further risk stratify her and her family.  We discussed sources of saturated fats but I think she will need pharmacotherapy to reduce her cardiovascular risk.  This was explained to her I also provided her with information on familial hypercholesterolemia which I think she has.  There is no presence of xanthomas or xanthelasma.  She has an appointment with her primary care team and will discuss intensification of therapy  at that office visit.   Essential hypertension Assessment & Plan: Her blood pressure has significantly improved due to improved adherence with medication.  She is currently taking amlodipine 2.5 mg a day and denies any orthostasis.  Most recent renal parameters shows normal GFR and  electrolytes.  Losing 10% of body weight may improve blood pressure control.  She needs to monitor for orthostasis while losing weight.   Class 2 severe obesity with serious comorbidity and body mass index (BMI) of 39.0 to 39.9 in adult, unspecified obesity type  Prediabetes Assessment & Plan: Most recent A1c is  Lab Results  Component Value Date   HGBA1C 6.0 (H) 02/02/2023   HGBA1C 6.2 (H) 06/08/2016  . Patient informed of disease state and risk of progression. This may contribute to abnormal cravings, fatigue and diabetes complications without having diabetes.   We reviewed treatment options which include losing 7 to 10% of body weight, increasing physical activity to a 150 minutes a week of moderate intensity.She may also be a candidate for pharmacoprophylaxis with metformin or incretin mimetic.     Vitamin D deficiency Assessment & Plan: Most recent vitamin D levels are in the 20s and unchanged from previous.  She acknowledges forgetting to take her vitamin D.  She has over-the-counter 5000 units.  I advised that she take 1 tablet every other day if forgetfulness continues to be a problem maybe once a week may offer an alternative.      PHYSICAL EXAM:  Blood pressure (!) 98/56, pulse 69, temperature 98.3 F (36.8 C), height 5\' 3"  (1.6 m), weight 217 lb (98.4 kg), SpO2 96 %. Body mass index is 38.44 kg/m.  General: She is overweight, cooperative, alert, well developed, and in no acute distress. PSYCH: Has normal mood, affect and thought process.   HEENT: EOMI, sclerae are anicteric. Lungs: Normal breathing effort, no conversational dyspnea. Extremities: No edema.  Neurologic: No gross sensory or motor deficits. No tremors or fasciculations noted.    DIAGNOSTIC DATA REVIEWED:  BMET    Component Value Date/Time   NA 140 02/02/2023 1000   K 4.2 02/02/2023 1000   CL 98 02/02/2023 1000   CO2 27 02/02/2023 1000   GLUCOSE 101 (H) 02/02/2023 1000   GLUCOSE 94 05/09/2017  0902   BUN 11 02/02/2023 1000   CREATININE 0.64 02/02/2023 1000   CREATININE 0.62 05/09/2017 0902   CALCIUM 9.8 02/02/2023 1000   GFRNONAA 89 04/22/2020 1313   GFRNONAA >89 05/09/2017 0902   GFRAA 103 04/22/2020 1313   GFRAA >89 05/09/2017 0902   Lab Results  Component Value Date   HGBA1C 6.0 (H) 02/02/2023   HGBA1C 6.2 (H) 06/08/2016   Lab Results  Component Value Date   INSULIN 20.5 02/02/2023   Lab Results  Component Value Date   TSH 1.430 10/15/2022   CBC    Component Value Date/Time   WBC 9.4 10/15/2022 1625   WBC 8.8 02/28/2018 1829   WBC 7.9 05/09/2017 0902   RBC 4.13 10/15/2022 1625   RBC 4.81 02/28/2018 1829   RBC 4.31 05/09/2017 0902   HGB 13.0 10/15/2022 1625   HCT 37.5 10/15/2022 1625   PLT 229 10/15/2022 1625   MCV 91 10/15/2022 1625   MCH 31.5 10/15/2022 1625   MCH 29.4 02/28/2018 1829   MCH 29.9 05/09/2017 0902   MCHC 34.7 10/15/2022 1625   MCHC 31.7 (A) 02/28/2018 1829   MCHC 33.0 05/09/2017 0902   RDW 12.5 10/15/2022 1625   Iron Studies No  results found for: "IRON", "TIBC", "FERRITIN", "IRONPCTSAT" Lipid Panel     Component Value Date/Time   CHOL 397 (H) 02/02/2023 1000   TRIG 308 (H) 02/02/2023 1000   HDL 63 02/02/2023 1000   CHOLHDL 4.8 (H) 01/20/2022 1536   CHOLHDL 3.8 05/09/2017 0902   VLDL 28 05/09/2017 0902   LDLCALC 266 (H) 02/02/2023 1000   Hepatic Function Panel     Component Value Date/Time   PROT 8.1 02/02/2023 1000   ALBUMIN 4.6 02/02/2023 1000   AST 26 02/02/2023 1000   ALT 15 02/02/2023 1000   ALKPHOS 106 02/02/2023 1000   BILITOT 0.5 02/02/2023 1000      Component Value Date/Time   TSH 1.430 10/15/2022 1625   Nutritional Lab Results  Component Value Date   VD25OH 23.1 (L) 02/02/2023   VD25OH 23.5 (L) 10/15/2022   VD25OH 41.6 04/22/2020     Return in about 3 weeks (around 03/09/2023) for For Weight Mangement with Dr. Rikki SpearingMaldonado.Marland Kitchen. She was informed of the importance of frequent follow up visits to maximize her  success with intensive lifestyle modifications for her multiple health conditions.   ATTESTASTION STATEMENTS:  Reviewed by clinician on day of visit: allergies, medications, problem list, medical history, surgical history, family history, social history, and previous encounter notes.     Worthy RancherEdgardo Charlane Westry, MD

## 2023-02-16 NOTE — Assessment & Plan Note (Addendum)
Patient's LDL is markedly elevated.  She reports having family members with heart disease, stroke and diabetes.  Her mom might of had heart problems at a young age but she is not sure.  She does not smoke.  She is currently on moderate intensity statin therapy and would likely require high intensity, plus ezetimibe and or PKS drug.  She may also benefit from genetic testing as this will help further risk stratify her and her family.  We discussed sources of saturated fats but I think she will need pharmacotherapy to reduce her cardiovascular risk.  This was explained to her I also provided her with information on familial hypercholesterolemia which I think she has.  There is no presence of xanthomas or xanthelasma.  She has an appointment with her primary care team and will discuss intensification of therapy at that office visit.

## 2023-02-16 NOTE — Assessment & Plan Note (Signed)
Her blood pressure has significantly improved due to improved adherence with medication.  She is currently taking amlodipine 2.5 mg a day and denies any orthostasis.  Most recent renal parameters shows normal GFR and electrolytes.  Losing 10% of body weight may improve blood pressure control.  She needs to monitor for orthostasis while losing weight.

## 2023-02-16 NOTE — Assessment & Plan Note (Signed)
Most recent A1c is  Lab Results  Component Value Date   HGBA1C 6.0 (H) 02/02/2023   HGBA1C 6.2 (H) 06/08/2016  . Patient informed of disease state and risk of progression. This may contribute to abnormal cravings, fatigue and diabetes complications without having diabetes.   We reviewed treatment options which include losing 7 to 10% of body weight, increasing physical activity to a 150 minutes a week of moderate intensity.She may also be a candidate for pharmacoprophylaxis with metformin or incretin mimetic.

## 2023-02-16 NOTE — Assessment & Plan Note (Signed)
Most recent vitamin D levels are in the 20s and unchanged from previous.  She acknowledges forgetting to take her vitamin D.  She has over-the-counter 5000 units.  I advised that she take 1 tablet every other day if forgetfulness continues to be a problem maybe once a week may offer an alternative.

## 2023-02-21 ENCOUNTER — Encounter: Payer: Self-pay | Admitting: *Deleted

## 2023-02-28 ENCOUNTER — Ambulatory Visit (INDEPENDENT_AMBULATORY_CARE_PROVIDER_SITE_OTHER): Payer: Medicare Other | Admitting: Nurse Practitioner

## 2023-02-28 ENCOUNTER — Encounter: Payer: Self-pay | Admitting: Nurse Practitioner

## 2023-02-28 VITALS — BP 138/64 | HR 67 | Temp 97.1°F | Ht 63.0 in | Wt 217.6 lb

## 2023-02-28 DIAGNOSIS — R7303 Prediabetes: Secondary | ICD-10-CM

## 2023-02-28 DIAGNOSIS — I10 Essential (primary) hypertension: Secondary | ICD-10-CM

## 2023-02-28 DIAGNOSIS — H538 Other visual disturbances: Secondary | ICD-10-CM

## 2023-02-28 DIAGNOSIS — E782 Mixed hyperlipidemia: Secondary | ICD-10-CM

## 2023-02-28 DIAGNOSIS — Z6839 Body mass index (BMI) 39.0-39.9, adult: Secondary | ICD-10-CM

## 2023-02-28 DIAGNOSIS — E559 Vitamin D deficiency, unspecified: Secondary | ICD-10-CM

## 2023-02-28 MED ORDER — REPATHA SURECLICK 140 MG/ML ~~LOC~~ SOAJ
140.0000 mg | SUBCUTANEOUS | 2 refills | Status: DC
Start: 2023-02-28 — End: 2023-04-29

## 2023-02-28 NOTE — Assessment & Plan Note (Signed)
Avoid sugar sweets soda Lose weight Lab Results  Component Value Date   HGBA1C 6.0 (H) 02/02/2023

## 2023-02-28 NOTE — Assessment & Plan Note (Addendum)
Wt Readings from Last 3 Encounters:  02/28/23 217 lb 9.6 oz (98.7 kg)  02/16/23 217 lb (98.4 kg)  02/02/23 221 lb (100.2 kg)  She has lost about 4 pounds in a month, patient congratulated on her efforts, appreciate collaboration with medical weight management clinic. Patient counseled on low-carb modified diet Engage in regular moderate to vigorous exercise of at least 150 minutes weekly

## 2023-02-28 NOTE — Patient Instructions (Addendum)
Please stop taking vitamin B 12 supplement  Continue vitamin D 5000 units every other day  Start taking fish oil 1000 mg two times daily for your hypertriglyceridemia.     1. Mixed hyperlipidemia  - Evolocumab (REPATHA SURECLICK) 140 MG/ML SOAJ; Inject 140 mg into the skin every 14 (fourteen) days.  Dispense: 2 mL; Refill: 2 - AMB Referral to [facility]  2. Blurry vision  - Ambulatory referral to Ophthalmology  3. Class 2 severe obesity with serious comorbidity and body mass index (BMI) of 39.0 to 39.9 in adult, unspecified obesity type   4. Prediabetes  It is important that you exercise regularly at least 30 minutes 5 times a week as tolerated  Think about what you will eat, plan ahead. Choose " clean, green, fresh or frozen" over canned, processed or packaged foods which are more sugary, salty and fatty. 70 to 75% of food eaten should be vegetables and fruit. Three meals at set times with snacks allowed between meals, but they must be fruit or vegetables. Aim to eat over a 12 hour period , example 7 am to 7 pm, and STOP after  your last meal of the day. Drink water,generally about 64 ounces per day, no other drink is as healthy. Fruit juice is best enjoyed in a healthy way, by EATING the fruit.  Thanks for choosing [facility] we consider it a privelige to serve you.
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
## 2023-02-28 NOTE — Assessment & Plan Note (Signed)
Lab Results  Component Value Date   CHOL 397 (H) 02/02/2023   HDL 63 02/02/2023   LDLCALC 266 (H) 02/02/2023   TRIG 308 (H) 02/02/2023   CHOLHDL 4.8 (H) 01/20/2022   1. Mixed hyperlipidemia  - Evolocumab (REPATHA SURECLICK) 140 MG/ML SOAJ; Inject 140 mg into the skin every 14 (fourteen) days.  Dispense: 2 mL; Refill: 2 - AMB Referral to [facility] Currently on fish oil 1000 mg daily increase to 1000 mg twice daily Avoid fatty fried foods Engage in regular moderate to vigorous exercises Follow-up in 8 weeks

## 2023-02-28 NOTE — Assessment & Plan Note (Signed)
Continue vitamin D 5000 units every other day Last vitamin D Lab Results  Component Value Date   VD25OH 23.1 (L) 02/02/2023

## 2023-02-28 NOTE — Assessment & Plan Note (Signed)
BP Readings from Last 3 Encounters:  02/28/23 138/64  02/16/23 (!) 98/56  02/02/23 (!) 152/91   HTN Controlled . Continue current dose of amlodipine 2.5mg  daily  No changes in management. Discussed DASH diet and dietary sodium restrictions Continue to increase dietary efforts and exercise.

## 2023-03-07 ENCOUNTER — Other Ambulatory Visit: Payer: Medicare Other

## 2023-03-07 ENCOUNTER — Other Ambulatory Visit: Payer: Self-pay

## 2023-03-07 DIAGNOSIS — E782 Mixed hyperlipidemia: Secondary | ICD-10-CM

## 2023-03-08 LAB — LIPID PANEL
Chol/HDL Ratio: 3.9 ratio (ref 0.0–4.4)
Cholesterol, Total: 284 mg/dL — ABNORMAL HIGH (ref 100–199)
HDL: 72 mg/dL (ref >39)
LDL Chol Calc (NIH): 186 mg/dL — ABNORMAL HIGH (ref 0–99)
Triglycerides: 144 mg/dL (ref 0–149)
VLDL Cholesterol Cal: 26 mg/dL (ref 5–40)

## 2023-03-10 ENCOUNTER — Telehealth: Payer: Self-pay

## 2023-03-10 ENCOUNTER — Other Ambulatory Visit: Payer: Self-pay

## 2023-03-10 NOTE — Telephone Encounter (Signed)
Key: [id number] Repatha prior auth submitted to ins today via covermymeds

## 2023-03-14 ENCOUNTER — Other Ambulatory Visit: Payer: Self-pay

## 2023-03-16 ENCOUNTER — Ambulatory Visit (INDEPENDENT_AMBULATORY_CARE_PROVIDER_SITE_OTHER): Payer: Medicare Other | Admitting: Internal Medicine

## 2023-03-16 NOTE — Telephone Encounter (Signed)
approved

## 2023-03-30 ENCOUNTER — Encounter (INDEPENDENT_AMBULATORY_CARE_PROVIDER_SITE_OTHER): Payer: Self-pay | Admitting: Physician Assistant

## 2023-03-30 ENCOUNTER — Ambulatory Visit (INDEPENDENT_AMBULATORY_CARE_PROVIDER_SITE_OTHER): Payer: Medicare Other | Admitting: Physician Assistant

## 2023-03-30 VITALS — BP 148/91 | HR 56 | Temp 98.3°F | Ht 63.0 in | Wt 214.0 lb

## 2023-03-30 DIAGNOSIS — R7303 Prediabetes: Secondary | ICD-10-CM | POA: Diagnosis not present

## 2023-03-30 DIAGNOSIS — F5101 Primary insomnia: Secondary | ICD-10-CM

## 2023-03-30 DIAGNOSIS — E559 Vitamin D deficiency, unspecified: Secondary | ICD-10-CM

## 2023-03-30 DIAGNOSIS — I10 Essential (primary) hypertension: Secondary | ICD-10-CM | POA: Diagnosis not present

## 2023-03-30 DIAGNOSIS — E669 Obesity, unspecified: Secondary | ICD-10-CM | POA: Insufficient documentation

## 2023-03-30 DIAGNOSIS — Z6837 Body mass index (BMI) 37.0-37.9, adult: Secondary | ICD-10-CM | POA: Insufficient documentation

## 2023-03-30 DIAGNOSIS — E782 Mixed hyperlipidemia: Secondary | ICD-10-CM

## 2023-03-30 MED ORDER — TRAZODONE HCL 50 MG PO TABS
25.0000 mg | ORAL_TABLET | Freq: Every evening | ORAL | 3 refills | Status: AC | PRN
Start: 2023-03-30 — End: ?
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
## 2023-04-13 ENCOUNTER — Ambulatory Visit (INDEPENDENT_AMBULATORY_CARE_PROVIDER_SITE_OTHER): Payer: Medicare Other | Admitting: Physician Assistant

## 2023-04-25 ENCOUNTER — Ambulatory Visit: Payer: Self-pay | Admitting: Nurse Practitioner

## 2023-04-26 ENCOUNTER — Other Ambulatory Visit: Payer: Self-pay | Admitting: Nurse Practitioner

## 2023-04-26 DIAGNOSIS — I10 Essential (primary) hypertension: Secondary | ICD-10-CM

## 2023-04-29 ENCOUNTER — Ambulatory Visit (INDEPENDENT_AMBULATORY_CARE_PROVIDER_SITE_OTHER): Payer: Medicare Other | Admitting: Nurse Practitioner

## 2023-04-29 ENCOUNTER — Encounter: Payer: Self-pay | Admitting: Nurse Practitioner

## 2023-04-29 VITALS — BP 156/70 | HR 59 | Ht 63.0 in | Wt 220.0 lb

## 2023-04-29 DIAGNOSIS — R748 Abnormal levels of other serum enzymes: Secondary | ICD-10-CM | POA: Diagnosis not present

## 2023-04-29 DIAGNOSIS — R7989 Other specified abnormal findings of blood chemistry: Secondary | ICD-10-CM

## 2023-04-29 DIAGNOSIS — E559 Vitamin D deficiency, unspecified: Secondary | ICD-10-CM

## 2023-04-29 DIAGNOSIS — I10 Essential (primary) hypertension: Secondary | ICD-10-CM

## 2023-04-29 DIAGNOSIS — E782 Mixed hyperlipidemia: Secondary | ICD-10-CM | POA: Diagnosis not present

## 2023-04-29 DIAGNOSIS — Z888 Allergy status to other drugs, medicaments and biological substances status: Secondary | ICD-10-CM | POA: Diagnosis not present

## 2023-04-29 DIAGNOSIS — Z6839 Body mass index (BMI) 39.0-39.9, adult: Secondary | ICD-10-CM

## 2023-04-29 DIAGNOSIS — G4700 Insomnia, unspecified: Secondary | ICD-10-CM

## 2023-04-29 MED ORDER — REPATHA SURECLICK 140 MG/ML ~~LOC~~ SOAJ: 140.0000 mg | SUBCUTANEOUS | 3 refills | Status: DC

## 2023-04-29 MED ORDER — AMLODIPINE BESYLATE 5 MG PO TABS
5.0000 mg | ORAL_TABLET | Freq: Every day | ORAL | 1 refills | Status: DC
Start: 2023-04-29 — End: 2023-07-04

## 2023-04-29 NOTE — Assessment & Plan Note (Signed)
She is doing well on Repatha 140 mg every 2 weeks Continue fish oil 1000 mg twice daily Continue current medication Medication refilled Follow-up with Dr. Britt Bottom in October Checking lipid panel today Follow-up in the office in 4 months

## 2023-04-29 NOTE — Assessment & Plan Note (Signed)
Takes vitamin D 5000 units on some days Continue current medication Will recheck labs at next visit

## 2023-04-29 NOTE — Progress Notes (Signed)
Established Patient Office Visit  Subjective:  Patient ID: Rebecca [name], female    DOB: [birth year]  Age: 67 y.o. MRN: [id number]  CC:  Chief Complaint  Patient presents with   Follow-up    HPI Rebecca [name] is a 67 y.o. female  has a past medical history of Acid reflux, Anxiety, High blood pressure, Hyperlipidemia, Neck pain, Obesity, Peritonsillar abscess (02/03/2017), PTSD (post-traumatic stress disorder), Sleep deprivation, and Thyroid disease. Patient presents for follow-up for hyperlipidemia   Hyperlipidemia.  Currently on Repatha injection every 2 weeks, has upcoming appointment with Dr. Rennis Golden in October.   Hypertension.  Currently on amlodipine 2.5 mg daily.  Patient stated that she has been taking this medication daily as ordered.  She does not check her blood pressure.  She denies headache, dizziness, edema.     Obesity .she she still working on her diet, aims for 2000 steps daily.  She plans on following up with the medical weight management clinic.   Due for Tdap vaccine and shingles vaccine need for both vaccines discussed patient encouraged to get the vaccines at her pharmacy.  patient denies any adverse reactions to current medication.   Past Medical History:  Diagnosis Date   Acid reflux    Anxiety    High blood pressure    Hyperlipidemia    Neck pain    Obesity    Peritonsillar abscess 02/03/2017   PTSD (post-traumatic stress disorder)    Sleep deprivation    Thyroid disease    monitoring a nodule 1/9    Past Surgical History:  Procedure Laterality Date   BREAST LUMPECTOMY     LAPAROSCOPIC TOTAL HYSTERECTOMY  2012    Family History  Problem Relation Age of Onset   Cancer Mother        pancreatic cancer   Diabetes Mother    Heart disease Mother        rheumatic heart disease   Hyperlipidemia Mother    High blood pressure Mother    Cancer Father        renal, bone   Diabetes Father    Hyperlipidemia Father    High blood pressure  Father    COPD Sister    Sleep apnea Sister    Cancer Sister        Lung Cancer   Cancer Brother        lung cancer   Kidney disease Brother    Heart attack Maternal Grandfather    Colon cancer Maternal Aunt     Social History   Socioeconomic History   Marital status: Single    Spouse name: N/A   Number of children: 0   Years of education: 18.5   Highest education level: Not on file  Occupational History   Occupation: Retired    Associate Professor: Advice worker    Comment: [employer] x 30 years   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   Retired after 30 years, and took another job (commutes to [place] 4 days/week).   Social Determinants of Health   Financial Resource Strain: Low Risk  (12/23/2022)   Overall Financial Resource Strain (CARDIA)    Difficulty of Paying Living Expenses: Not hard at all  Food Insecurity: No Food Insecurity (12/23/2022)   Hunger Vital Sign    Worried About Running Out of Food in the Last Year: Never true    Ran Out of Food in the Last Year: Never true  Transportation Needs: No Transportation Needs (12/23/2022)   PRAPARE - Administrator, Civil Service (Medical): No    Lack of Transportation (Non-Medical): No  Physical Activity: Sufficiently Active (12/23/2022)   Exercise Vital Sign    Days of Exercise per Week: 3 days    Minutes of Exercise per Session: 60 min  Stress: No Stress Concern Present  (12/23/2022)   [employer] of Occupational Health - Occupational Stress Questionnaire    Feeling of Stress : Not at all  Social Connections: Moderately Isolated (12/23/2022)   Social Connection and Isolation Panel [NHANES]    Frequency of Communication with Friends and Family: More than three times a week    Frequency of Social Gatherings with Friends and Family: More than three times a week    Attends Religious Services: Never    Database administrator or Organizations: Yes    Attends Engineer, structural: More than 4 times per year    Marital Status: Never married  Intimate Partner Violence: Not At Risk (12/23/2022)   Humiliation, Afraid, Rape, and Kick questionnaire    Fear of Current or Ex-Partner: No    Emotionally Abused: No    Physically Abused: No    Sexually Abused: No    Outpatient Medications Prior to Visit  Medication Sig Dispense Refill   acetaminophen (TYLENOL) 500 MG tablet Take 1 tablet (500 mg total) by mouth every 6 (six) hours as needed. 30 tablet 0   Albuterol Sulfate (PROAIR RESPICLICK) 108 (90 Base) MCG/ACT AEPB Inhale into the lungs. 2 puffs every four hrs as needed     B Complex Vitamins (B-COMPLEX/B-12 PO) Take by mouth.     cholecalciferol (VITAMIN D) 1000 units tablet Take 5,000 Units by mouth daily.     cyclobenzaprine (FLEXERIL) 10 MG tablet Take 1 tablet (10 mg total) by mouth 3 (three) times daily as needed for muscle spasms. 30 tablet 3   famotidine (PEPCID) 20 MG tablet Take 20 mg by mouth 2 (two) times daily.     hydrOXYzine (VISTARIL) 25 MG capsule Take 1 capsule (25 mg total) by mouth at bedtime as needed. 30 capsule 0   traZODone (DESYREL) 50 MG tablet Take 0.5-1 tablets (25-50 mg total) by mouth at bedtime as needed for sleep. 90 tablet 3   amLODipine (NORVASC) 2.5 MG tablet TAKE 1 TABLET(2.5 MG) BY MOUTH DAILY 60 tablet 0   Evolocumab (REPATHA SURECLICK) 140 MG/ML SOAJ Inject 140 mg into the skin every 14 (fourteen) days. 2 mL 2    Omega-3 Fatty Acids (FISH OIL) 1000 MG CAPS Take by mouth. (Patient not taking: Reported on 04/29/2023)     atorvastatin (LIPITOR) 10 MG tablet Take 1 tablet (10 mg total) by mouth daily. (Patient not taking: Reported on 04/29/2023) 90 tablet 3   sertraline (ZOLOFT) 25 MG tablet Take 3 tablets (75 mg total) by mouth daily. (Patient not taking: Reported  on 04/29/2023) 90 tablet 1   No facility-administered medications prior to visit.    Allergies  Allergen Reactions   Prednisolone Other (See Comments)    Patient can't remember  Other reaction(s): Delusions (intolerance) Patient can't remember    Prednisone     Other reaction(s): BAD DREAMS/HALLUCINATIONS    ROS Review of Systems  Constitutional:  Negative for activity change, appetite change, chills, fatigue and fever.  HENT:  Negative for congestion, dental problem, ear discharge, ear pain, hearing loss, rhinorrhea, sinus pressure, sinus pain, sneezing and sore throat.   Eyes:  Negative for pain, discharge, redness and itching.  Respiratory:  Negative for cough, chest tightness, shortness of breath and wheezing.   Cardiovascular:  Negative for chest pain, palpitations and leg swelling.  Gastrointestinal:  Negative for abdominal distention, abdominal pain, anal bleeding, blood in stool, constipation, diarrhea, nausea, rectal pain and vomiting.  Endocrine: Negative for cold intolerance, heat intolerance, polydipsia, polyphagia and polyuria.  Genitourinary:  Negative for difficulty urinating, dysuria, flank pain, frequency, hematuria, menstrual problem, pelvic pain and vaginal bleeding.  Musculoskeletal:  Negative for arthralgias, back pain, gait problem, joint swelling and myalgias.  Skin:  Negative for color change, pallor, rash and wound.  Allergic/Immunologic: Negative for environmental allergies, food allergies and immunocompromised state.  Neurological:  Negative for dizziness, tremors, facial asymmetry, weakness and headaches.   Hematological:  Negative for adenopathy. Does not bruise/bleed easily.  Psychiatric/Behavioral:  Negative for agitation, behavioral problems, confusion, decreased concentration, hallucinations, self-injury and suicidal ideas.       Objective:    Physical Exam Vitals and nursing note reviewed.  Constitutional:      General: She is not in acute distress.    Appearance: Normal appearance. She is obese. She is not ill-appearing, toxic-appearing or diaphoretic.  HENT:     Mouth/Throat:     Mouth: Mucous membranes are moist.     Pharynx: Oropharynx is clear. No oropharyngeal exudate or posterior oropharyngeal erythema.  Eyes:     General: No scleral icterus.       Right eye: No discharge.        Left eye: No discharge.     Extraocular Movements: Extraocular movements intact.     Conjunctiva/sclera: Conjunctivae normal.  Cardiovascular:     Rate and Rhythm: Normal rate and regular rhythm.     Pulses: Normal pulses.     Heart sounds: Normal heart sounds. No murmur heard.    No friction rub. No gallop.  Pulmonary:     Effort: Pulmonary effort is normal. No respiratory distress.     Breath sounds: Normal breath sounds. No stridor. No wheezing, rhonchi or rales.  Chest:     Chest wall: No tenderness.  Abdominal:     General: There is no distension.     Palpations: Abdomen is soft.     Tenderness: There is no abdominal tenderness. There is no right CVA tenderness, left CVA tenderness or guarding.  Musculoskeletal:        General: No swelling, tenderness, deformity or signs of injury.     Right lower leg: No edema.     Left lower leg: No edema.  Skin:    General: Skin is warm and dry.     Capillary Refill: Capillary refill takes less than 2 seconds.     Coloration: Skin is not jaundiced or pale.     Findings: No bruising, erythema or lesion.  Neurological:     Mental Status: She is alert and oriented to person,  place, and time.     Motor: No weakness.     Coordination:  Coordination normal.     Gait: Gait normal.  Psychiatric:        Mood and Affect: Mood normal.        Behavior: Behavior normal.        Thought Content: Thought content normal.        Judgment: Judgment normal.     BP (!) 156/70   Pulse (!) 59   Ht 5\' 3"  (1.6 m)   Wt 220 lb (99.8 kg)   SpO2 98%   BMI 38.97 kg/m  Wt Readings from Last 3 Encounters:  04/29/23 220 lb (99.8 kg)  03/30/23 214 lb (97.1 kg)  02/28/23 217 lb 9.6 oz (98.7 kg)    Lab Results  Component Value Date   TSH 1.430 10/15/2022   Lab Results  Component Value Date   WBC 9.4 10/15/2022   HGB 13.0 10/15/2022   HCT 37.5 10/15/2022   MCV 91 10/15/2022   PLT 229 10/15/2022   Lab Results  Component Value Date   NA 140 02/02/2023   K 4.2 02/02/2023   CO2 27 02/02/2023   GLUCOSE 101 (H) 02/02/2023   BUN 11 02/02/2023   CREATININE 0.64 02/02/2023   BILITOT 0.5 02/02/2023   ALKPHOS 106 02/02/2023   AST 26 02/02/2023   ALT 15 02/02/2023   PROT 8.1 02/02/2023   ALBUMIN 4.6 02/02/2023   CALCIUM 9.8 02/02/2023   EGFR 99 02/02/2023   Lab Results  Component Value Date   CHOL 284 (H) 03/07/2023   Lab Results  Component Value Date   HDL 72 03/07/2023   Lab Results  Component Value Date   LDLCALC 186 (H) 03/07/2023   Lab Results  Component Value Date   TRIG 144 03/07/2023   Lab Results  Component Value Date   CHOLHDL 3.9 03/07/2023   Lab Results  Component Value Date   HGBA1C 6.0 (H) 02/02/2023      Assessment & Plan:   Problem List Items Addressed This Visit       Cardiovascular and Mediastinum   Essential hypertension - Primary    BP Readings from Last 3 Encounters:  04/29/23 (!) 156/70  03/30/23 (!) 148/91  02/28/23 138/64  Currently on amlodipine 2.5 mg daily Start amlodipine 5 mg daily Discussed DASH diet and dietary sodium restrictions Continue to increase dietary efforts and exercise.  Patient encouraged to monitor blood pressure at home and report hypertension Nurse  visit in 2 weeks to recheck blood pressure        Relevant Medications   amLODipine (NORVASC) 5 MG tablet   Evolocumab (REPATHA SURECLICK) 140 MG/ML SOAJ   Other Relevant Orders   Recheck vitals     Other   Hyperlipidemia (Chronic)    She is doing well on Repatha 140 mg every 2 weeks Continue fish oil 1000 mg twice daily Continue current medication Medication refilled Follow-up with Dr. Britt Bottom in October Checking lipid panel today Follow-up in the office in 4 months      Relevant Medications   amLODipine (NORVASC) 5 MG tablet   Evolocumab (REPATHA SURECLICK) 140 MG/ML SOAJ   Other Relevant Orders   Lipid panel   Class 2 severe obesity with serious comorbidity and body mass index (BMI) of 39.0 to 39.9 in adult (HCC)    Wt Readings from Last 3 Encounters:  04/29/23 220 lb (99.8 kg)  03/30/23 214 lb (97.1 kg)  02/28/23 217  lb 9.6 oz (98.7 kg)   Body mass index is 38.97 kg/m.  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    Dispense:  90 tablet    Refill:  1   Evolocumab (REPATHA SURECLICK) 140 MG/ML SOAJ    Sig: Inject 140 mg into the skin  every 14 (fourteen) days.    Dispense:  2 mL    Refill:  3    Follow-up: Return in about 4 months (around 08/29/2023) for HTN, HYPERLIPIDEMIA.    Donell Beers, FNP

## 2023-04-29 NOTE — Assessment & Plan Note (Signed)
Wt Readings from Last 3 Encounters:  04/29/23 220 lb (99.8 kg)  03/30/23 214 lb (97.1 kg)  02/28/23 217 lb 9.6 oz (98.7 kg)   Body mass index is 38.97 kg/m.  Patient has been 6 pounds since last visit Patient counseled on low-carb modified diet She was encouraged to engage in regular moderate to vigorous exercise of at least 150 minutes weekly Follow up  medical weight management clinic

## 2023-04-29 NOTE — Patient Instructions (Signed)
1. Essential hypertension  - amLODipine (NORVASC) 5 MG tablet; Take 1 tablet (5 mg total) by mouth daily.  Dispense: 90 tablet; Refill: 1 - Recheck vitals    2. Mixed hyperlipidemia  - Lipid panel - Evolocumab (REPATHA SURECLICK) 140 MG/ML SOAJ; Inject 140 mg into the skin every 14 (fourteen) days.  Dispense: 2 mL; Refill: 3  Around 3 times per week, check your blood pressure 2 times per day. once in the morning and once in the evening. The readings should be at least one minute apart. Write down these values and bring them to your next nurse visit/appointment.  When you check your BP, make sure you have been doing something calm/relaxing 5 minutes prior to checking. Both feet should be flat on the floor and you should be sitting. Use your left arm and make sure it is in a relaxed position (on a table), and that the cuff is at the approximate level/height of your heart.    Blood pressure goal is less than 140/90. Please call the office for low Blood pressure  It is important that you exercise regularly at least 30 minutes 5 times a week as tolerated  Think about what you will eat, plan ahead. Choose " clean, green, fresh or frozen" over canned, processed or packaged foods which are more sugary, salty and fatty. 70 to 75% of food eaten should be vegetables and fruit. Three meals at set times with snacks allowed between meals, but they must be fruit or vegetables. Aim to eat over a 12 hour period , example 7 am to 7 pm, and STOP after  your last meal of the day. Drink water,generally about 64 ounces per day, no other drink is as healthy. Fruit juice is best enjoyed in a healthy way, by EATING the fruit.  Thanks for choosing [facility] we consider it a privelige to serve you.

## 2023-04-29 NOTE — Assessment & Plan Note (Signed)
BP Readings from Last 3 Encounters:  04/29/23 (!) 156/70  03/30/23 (!) 148/91  02/28/23 138/64  Currently on amlodipine 2.5 mg daily Start amlodipine 5 mg daily Discussed DASH diet and dietary sodium restrictions Continue to increase dietary efforts and exercise.  Patient encouraged to monitor blood pressure at home and report hypertension Nurse visit in 2 weeks to recheck blood pressure

## 2023-04-29 NOTE — Assessment & Plan Note (Signed)
Lab Results  Component Value Date   VITAMINB12 1,255 (H) 02/02/2023  Patient encouraged to stop taking B12 supplement at this time reChecking labs

## 2023-04-29 NOTE — Assessment & Plan Note (Signed)
Currently on Repatha injection due to statin allergy

## 2023-04-29 NOTE — Assessment & Plan Note (Signed)
Continue trazodone 25 mg to 50 mg at bedtime as needed

## 2023-04-30 LAB — LIPID PANEL
Chol/HDL Ratio: 4.8 ratio — ABNORMAL HIGH (ref 0.0–4.4)
Cholesterol, Total: 304 mg/dL — ABNORMAL HIGH (ref 100–199)
HDL: 63 mg/dL (ref >39)
LDL Chol Calc (NIH): 190 mg/dL — ABNORMAL HIGH (ref 0–99)
Triglycerides: 267 mg/dL — ABNORMAL HIGH (ref 0–149)
VLDL Cholesterol Cal: 51 mg/dL — ABNORMAL HIGH (ref 5–40)

## 2023-04-30 LAB — VITAMIN B12: Vitamin B-12: 873 pg/mL (ref 232–1245)

## 2023-05-09 ENCOUNTER — Encounter (INDEPENDENT_AMBULATORY_CARE_PROVIDER_SITE_OTHER): Payer: Self-pay | Admitting: Physician Assistant

## 2023-05-09 ENCOUNTER — Ambulatory Visit (INDEPENDENT_AMBULATORY_CARE_PROVIDER_SITE_OTHER): Payer: Medicare Other | Admitting: Physician Assistant

## 2023-05-09 VITALS — BP 132/85 | HR 75 | Temp 98.0°F | Ht 63.0 in | Wt 216.0 lb

## 2023-05-09 DIAGNOSIS — E782 Mixed hyperlipidemia: Secondary | ICD-10-CM

## 2023-05-09 DIAGNOSIS — I10 Essential (primary) hypertension: Secondary | ICD-10-CM | POA: Diagnosis not present

## 2023-05-09 DIAGNOSIS — R7303 Prediabetes: Secondary | ICD-10-CM | POA: Diagnosis not present

## 2023-05-09 DIAGNOSIS — E669 Obesity, unspecified: Secondary | ICD-10-CM | POA: Diagnosis not present

## 2023-05-09 DIAGNOSIS — Z6838 Body mass index (BMI) 38.0-38.9, adult: Secondary | ICD-10-CM | POA: Diagnosis not present

## 2023-05-09 NOTE — Progress Notes (Signed)
[facility]  Office: [phone]  /  Fax: [phone]  WEIGHT SUMMARY AND BIOMETRICS  Vitals Temp: 98 F (36.7 C) BP: 132/85 Pulse Rate: 75 SpO2: 97 %   Anthropometric Measurements Height: 5\' 3"  (1.6 m) Weight: 216 lb (98 kg) BMI (Calculated): 38.27 Weight at Last Visit: 214 lb Weight Lost Since Last Visit: 2 lb Weight Gained Since Last Visit: 0 Starting Weight: 221 lb Total Weight Loss (lbs): 5 lb (2.268 kg) Peak Weight: 232 lb   Body Composition  Body Fat %: 45.5 % Fat Mass (lbs): 98.6 lbs Muscle Mass (lbs): 112.2 lbs Total Body Water (lbs): 81.8 lbs Visceral Fat Rating : 14   Other Clinical Data Fasting: no Labs: no Today's Visit #: 4 Starting Date: 02/02/23     HPI  Chief Complaint: OBESITY  Rebecca [name] is here to discuss her progress with her obesity treatment plan. She is on the {MWMwtlossportion/plan2:23431} and states she is following her eating plan approximately *** % of the time. She states she is exercising *** minutes *** times per week.   Interval History:  Since last office visit she *** Hunger/appetite-*** Cravings- *** Stress- *** Sleep- *** Exercise-*** Hydration-***   Pharmacotherapy: Phentermine thru another clinic in the past for weight loss and did loose initially, but was not sustained. She has been in multiple weight loss programs/Did loose with [facility] based program in past .   TREATMENT PLAN FOR OBESITY:  Recommended Dietary Goals  Leiyah is currently in the action stage of change. As such, her goal is to continue weight management plan. She has agreed to {MWMwtlossportion/plan2:23431}.  Behavioral Intervention  We discussed the following Behavioral Modification Strategies today: {EMWMwtlossstrategies:28914::"increasing lean protein intake","decreasing simple carbohydrates ","increasing vegetables","increasing lower glycemic fruits","increasing water intake","continue to practice mindfulness when eating","planning for  success"}.  Additional resources provided today: NA  Recommended Physical Activity Goals  Alecia has been advised to work up to 150 minutes of moderate intensity aerobic activity a week and strengthening exercises 2-3 times per week for cardiovascular health, weight loss maintenance and preservation of muscle mass.   She has agreed to {EMEXERCISE:28847::"Think about ways to increase daily physical activity and overcoming barriers to exercise"}   Pharmacotherapy We discussed various medication options to help Jordie with her weight loss efforts and we both agreed to ***.    No follow-ups on file.Marland Kitchen She was informed of the importance of frequent follow up visits to maximize her success with intensive lifestyle modifications for her multiple health conditions.  PHYSICAL EXAM:  Blood pressure 132/85, pulse 75, temperature 98 F (36.7 C), height 5\' 3"  (1.6 m), weight 216 lb (98 kg), SpO2 97 %. Body mass index is 38.26 kg/m.  General: She is overweight, cooperative, alert, well developed, and in no acute distress. PSYCH: Has normal mood, affect and thought process.   Lungs: Normal breathing effort, no conversational dyspnea.  DIAGNOSTIC DATA REVIEWED:  BMET    Component Value Date/Time   NA 140 02/02/2023 1000   K 4.2 02/02/2023 1000   CL 98 02/02/2023 1000   CO2 27 02/02/2023 1000   GLUCOSE 101 (H) 02/02/2023 1000   GLUCOSE 94 05/09/2017 0902   BUN 11 02/02/2023 1000   CREATININE 0.64 02/02/2023 1000   CREATININE 0.62 05/09/2017 0902   CALCIUM 9.8 02/02/2023 1000   GFRNONAA 89 04/22/2020 1313   GFRNONAA >89 05/09/2017 0902   GFRAA 103 04/22/2020 1313   GFRAA >89 05/09/2017 0902   Lab Results  Component Value Date   HGBA1C 6.0 (H) 02/02/2023  HGBA1C 6.2 (H) 06/08/2016   Lab Results  Component Value Date   INSULIN 20.5 02/02/2023   Lab Results  Component Value Date   TSH 1.430 10/15/2022   CBC    Component Value Date/Time   WBC 9.4 10/15/2022 1625   WBC 8.8  02/28/2018 1829   WBC 7.9 05/09/2017 0902   RBC 4.13 10/15/2022 1625   RBC 4.81 02/28/2018 1829   RBC 4.31 05/09/2017 0902   HGB 13.0 10/15/2022 1625   HCT 37.5 10/15/2022 1625   PLT 229 10/15/2022 1625   MCV 91 10/15/2022 1625   MCH 31.5 10/15/2022 1625   MCH 29.4 02/28/2018 1829   MCH 29.9 05/09/2017 0902   MCHC 34.7 10/15/2022 1625   MCHC 31.7 (A) 02/28/2018 1829   MCHC 33.0 05/09/2017 0902   RDW 12.5 10/15/2022 1625   Iron Studies No results found for: "IRON", "TIBC", "FERRITIN", "IRONPCTSAT" Lipid Panel     Component Value Date/Time   CHOL 304 (H) 04/29/2023 1344   TRIG 267 (H) 04/29/2023 1344   HDL 63 04/29/2023 1344   CHOLHDL 4.8 (H) 04/29/2023 1344   CHOLHDL 3.8 05/09/2017 0902   VLDL 28 05/09/2017 0902   LDLCALC 190 (H) 04/29/2023 1344   Hepatic Function Panel     Component Value Date/Time   PROT 8.1 02/02/2023 1000   ALBUMIN 4.6 02/02/2023 1000   AST 26 02/02/2023 1000   ALT 15 02/02/2023 1000   ALKPHOS 106 02/02/2023 1000   BILITOT 0.5 02/02/2023 1000      Component Value Date/Time   TSH 1.430 10/15/2022 1625   Nutritional Lab Results  Component Value Date   VD25OH 23.1 (L) 02/02/2023   VD25OH 23.5 (L) 10/15/2022   VD25OH 41.6 04/22/2020    ASSOCIATED CONDITIONS ADDRESSED TODAY  ASSESSMENT AND PLAN  Problem List Items Addressed This Visit     Essential hypertension   Prediabetes - Primary   Generalized obesity- Start BMI 38.97   Hyperlipidemia (Chronic)    ATTESTASTION STATEMENTS:  Reviewed by clinician on day of visit: allergies, medications, problem list, medical history, surgical history, family history, social history, and previous encounter notes.   I have personally spent 30 minutes total time today in preparation, patient care, nutritional counseling and documentation for this visit, including the following: review of clinical lab tests; review of medical tests/procedures/services.      Letti Towell, PA-C

## 2023-05-13 ENCOUNTER — Ambulatory Visit: Payer: Self-pay

## 2023-06-06 ENCOUNTER — Encounter (INDEPENDENT_AMBULATORY_CARE_PROVIDER_SITE_OTHER): Payer: Self-pay | Admitting: Family Medicine

## 2023-06-06 ENCOUNTER — Telehealth (INDEPENDENT_AMBULATORY_CARE_PROVIDER_SITE_OTHER): Payer: Medicare Other | Admitting: Family Medicine

## 2023-06-06 DIAGNOSIS — F5089 Other specified eating disorder: Secondary | ICD-10-CM | POA: Diagnosis not present

## 2023-06-06 DIAGNOSIS — R7303 Prediabetes: Secondary | ICD-10-CM | POA: Diagnosis not present

## 2023-06-06 DIAGNOSIS — Z6838 Body mass index (BMI) 38.0-38.9, adult: Secondary | ICD-10-CM | POA: Diagnosis not present

## 2023-06-06 DIAGNOSIS — E669 Obesity, unspecified: Secondary | ICD-10-CM | POA: Diagnosis not present

## 2023-06-06 MED ORDER — TOPIRAMATE 25 MG PO TABS: 25.0000 mg | ORAL_TABLET | Freq: Every day | ORAL | 0 refills | Status: DC

## 2023-06-06 NOTE — Progress Notes (Signed)
TeleHealth Visit:  This visit was completed with telemedicine (audio/video) technology. Rebecca [name] has verbally consented to this TeleHealth visit. The patient is located at home, the provider is located at home. The participants in this visit include the listed provider and patient. The visit was conducted today via MyChart video.  OBESITY Rebecca [name] is here to discuss her progress with her obesity treatment plan along with follow-up of her obesity related diagnoses.   Today's visit was # 5 Starting weight: 221 lbs Starting date: 02/02/23 Weight at last in office visit: 216 lbs on 05/09/23 Total weight loss: 5 lbs at last in office visit on 05/09/23. Today's reported weight (06/06/23): none reported   Nutrition Plan: the Category 2 plan - 50% adherence.  Current exercise:  total body training/strengthening 60-90 minutes 7 times per week.  Interim History:  She feels consistency with the meal plan is an issue for her. Skips lunch daily.  Sometimes eats breakfast as early as 7 and does not eat dinner until 6 or 7 PM.  Denies hunger during this time but then reports polyphagia after dinner and eating during the night. She does not really like sandwiches but will eat wraps. She mostly eats at home. She notes occasional stress eating. Drinks only water. Protein deficient due to skipping lunch.  Works out 7 days/week.  She is headed to [place] in a few weeks for a [employer].  She says this is the first plan she is done where they did not give her some type of "supplement" to help with weight loss.  Has been on phentermine in the past. She is not sure if this is the right program for her. Assessment/Plan:  1. Prediabetes Last A1c was 6.0 02/02/23.  Medication(s): None Polyphagia:Yes-at night. Lab Results  Component Value Date   HGBA1C 6.0 (H) 02/02/2023   HGBA1C 5.6 10/15/2022   HGBA1C 5.5 01/20/2022   HGBA1C 5.5 01/20/2022   HGBA1C 5.5 (A) 01/20/2022   HGBA1C 5.5  01/20/2022   Lab Results  Component Value Date   INSULIN 20.5 02/02/2023    Plan: Work on getting in adequate protein. Stop skipping lunch.   2. Eating disorder/emotional eating Rebecca [name] has had issues with stress/emotional eating. Currently this is poorly controlled. Overall mood is stable. Medication(s): Other: None  Plan: Start Topiramate 25 mg daily at supper. Denies history of nephrolithiasis. She will stop skipping lunch.  3. Generalized Obesity: Current BMI 38 Rebecca [name] is currently in the action stage of change. As such, her goal is to continue with weight loss efforts.  She has agreed to the Category 2 plan  1.  Lunch options sent via MyChart. 2.  May use a low-carb wrap instead of bread for sandwich/wrap. 3.  Discussed impact of skipping meals on metabolism and advised weight loss will improve with eating regular meals and sufficient protein.  Exercise goals: As is  Behavioral modification strategies: increasing lean protein intake, decreasing simple carbohydrates , no meal skipping, meal planning , planning for success, and travel eating strategies.  Rebecca [name] has agreed to follow-up with our clinic in 4 weeks.  No orders of the defined types were placed in this encounter.   There are no discontinued medications.   Meds ordered this encounter  Medications   topiramate (TOPAMAX) 25 MG tablet    Sig: Take 1 tablet (25 mg total) by mouth daily with supper.    Dispense:  30 tablet    Refill:  0    Order Specific Question:   Supervising  Provider    Answer:   Carolin Sicks      Objective:   VITALS: Per patient if applicable, see vitals. GENERAL: Alert and in no acute distress. CARDIOPULMONARY: No increased WOB. Speaking in clear sentences.  PSYCH: Pleasant and cooperative. Speech normal rate and rhythm. Affect is appropriate. Insight and judgement are appropriate. Attention is focused, linear, and appropriate.  NEURO: Oriented as arrived to appointment on  time with no prompting.   Attestation Statements:   Reviewed by clinician on day of visit: allergies, medications, problem list, medical history, surgical history, family history, social history, and previous encounter notes.   This was prepared with the assistance of Engineer, civil (consulting).  Occasional wrong-word or sound-a-like substitutions may have occurred due to the inherent limitations of voice recognition software.

## 2023-06-07 ENCOUNTER — Ambulatory Visit (INDEPENDENT_AMBULATORY_CARE_PROVIDER_SITE_OTHER): Payer: Medicare Other | Admitting: Physician Assistant

## 2023-07-03 NOTE — Progress Notes (Unsigned)
.smr  Office: [phone]  /  Fax: [phone]  WEIGHT SUMMARY AND BIOMETRICS  No data recorded No data recorded No data recorded No data recorded   HPI  Chief Complaint: OBESITY  Rebecca [name] is here to discuss her progress with her obesity treatment plan. She is on the {MWMwtlossportion/plan2:23431} and states she is following her eating plan approximately *** % of the time. She states she is exercising *** minutes *** times per week.   Interval History:  Since last office visit she *** Hunger/appetite-*** Cravings- *** Stress- *** Sleep- *** Exercise-*** Hydration-***   Pharmacotherapy: ***  TREATMENT PLAN FOR OBESITY:  Recommended Dietary Goals  Rebecca [name] is currently in the action stage of change. As such, her goal is to continue weight management plan. She has agreed to {MWMwtlossportion/plan2:23431}.  Behavioral Intervention  We discussed the following Behavioral Modification Strategies today: {EMWMwtlossstrategies:28914::"increasing lean protein intake","decreasing simple carbohydrates ","increasing vegetables","increasing lower glycemic fruits","increasing water intake","continue to practice mindfulness when eating","planning for success"}.  Additional resources provided today: NA  Recommended Physical Activity Goals  Rebecca [name] has been advised to work up to 150 minutes of moderate intensity aerobic activity a week and strengthening exercises 2-3 times per week for cardiovascular health, weight loss maintenance and preservation of muscle mass.   She has agreed to {EMEXERCISE:28847::"Think about ways to increase daily physical activity and overcoming barriers to exercise"}   Pharmacotherapy We discussed various medication options to help Rebecca [name] with her weight loss efforts and we both agreed to ***.    No follow-ups on file.Marland Kitchen She was informed of the importance of frequent follow up visits to maximize her success with intensive lifestyle modifications for her  multiple health conditions.  PHYSICAL EXAM:  There were no vitals taken for this visit. There is no height or weight on file to calculate BMI.  General: She is overweight, cooperative, alert, well developed, and in no acute distress. PSYCH: Has normal mood, affect and thought process.   Cardiovascular: HR ***, BP *** Lungs: Normal breathing effort, no conversational dyspnea. Neuro: ***  DIAGNOSTIC DATA REVIEWED:  BMET    Component Value Date/Time   NA 140 02/02/2023 1000   K 4.2 02/02/2023 1000   CL 98 02/02/2023 1000   CO2 27 02/02/2023 1000   GLUCOSE 101 (H) 02/02/2023 1000   GLUCOSE 94 05/09/2017 0902   BUN 11 02/02/2023 1000   CREATININE 0.64 02/02/2023 1000   CREATININE 0.62 05/09/2017 0902   CALCIUM 9.8 02/02/2023 1000   GFRNONAA 89 04/22/2020 1313   GFRNONAA >89 05/09/2017 0902   GFRAA 103 04/22/2020 1313   GFRAA >89 05/09/2017 0902   Lab Results  Component Value Date   HGBA1C 6.0 (H) 02/02/2023   HGBA1C 6.2 (H) 06/08/2016   Lab Results  Component Value Date   INSULIN 20.5 02/02/2023   Lab Results  Component Value Date   TSH 1.430 10/15/2022   CBC    Component Value Date/Time   WBC 9.4 10/15/2022 1625   WBC 8.8 02/28/2018 1829   WBC 7.9 05/09/2017 0902   RBC 4.13 10/15/2022 1625   RBC 4.81 02/28/2018 1829   RBC 4.31 05/09/2017 0902   HGB 13.0 10/15/2022 1625   HCT 37.5 10/15/2022 1625   PLT 229 10/15/2022 1625   MCV 91 10/15/2022 1625   MCH 31.5 10/15/2022 1625   MCH 29.4 02/28/2018 1829   MCH 29.9 05/09/2017 0902   MCHC 34.7 10/15/2022 1625   MCHC 31.7 (A) 02/28/2018 1829   MCHC 33.0 05/09/2017 0902   RDW  12.5 10/15/2022 1625   Iron Studies No results found for: "IRON", "TIBC", "FERRITIN", "IRONPCTSAT" Lipid Panel     Component Value Date/Time   CHOL 304 (H) 04/29/2023 1344   TRIG 267 (H) 04/29/2023 1344   HDL 63 04/29/2023 1344   CHOLHDL 4.8 (H) 04/29/2023 1344   CHOLHDL 3.8 05/09/2017 0902   VLDL 28 05/09/2017 0902   LDLCALC 190  (H) 04/29/2023 1344   Hepatic Function Panel     Component Value Date/Time   PROT 8.1 02/02/2023 1000   ALBUMIN 4.6 02/02/2023 1000   AST 26 02/02/2023 1000   ALT 15 02/02/2023 1000   ALKPHOS 106 02/02/2023 1000   BILITOT 0.5 02/02/2023 1000      Component Value Date/Time   TSH 1.430 10/15/2022 1625   Nutritional Lab Results  Component Value Date   VD25OH 23.1 (L) 02/02/2023   VD25OH 23.5 (L) 10/15/2022   VD25OH 41.6 04/22/2020    ASSOCIATED CONDITIONS ADDRESSED TODAY  ASSESSMENT AND PLAN  Problem List Items Addressed This Visit     Essential hypertension   Prediabetes - Primary   Vitamin D deficiency   Generalized obesity- Start BMI 38.97   Hyperlipidemia (Chronic)   Other Visit Diagnoses     Other disorder of eating/emotional eating          Prediabetes Last A1c was ***  Medication(s): {dwwpharmacotherapy:29109} Polyphagia:{dwwyes:29172} Lab Results  Component Value Date   HGBA1C 6.0 (H) 02/02/2023   HGBA1C 5.6 10/15/2022   HGBA1C 5.5 01/20/2022   HGBA1C 5.5 01/20/2022   HGBA1C 5.5 (A) 01/20/2022   HGBA1C 5.5 01/20/2022   Lab Results  Component Value Date   INSULIN 20.5 02/02/2023    Plan: {dwwmed:29123} {dwwpharmacotherapy:29109}  Hypertension Hypertension {disease control degree:315147}.  Medication(s): ***  BP Readings from Last 3 Encounters:  05/09/23 132/85  04/29/23 (!) 156/70  03/30/23 (!) 148/91   Lab Results  Component Value Date   CREATININE 0.64 02/02/2023   CREATININE 0.67 10/15/2022   CREATININE 0.65 01/20/2022   No results found for: "GFR"  Plan: Continue all antihypertensives at current dosages.  Hyperlipidemia LDL {ACTION; IS/IS NOT:21021397} at goal. Medication(s): *** Cardiovascular risk factors: {cv risk factors:510}  Lab Results  Component Value Date   CHOL 304 (H) 04/29/2023   HDL 63 04/29/2023   LDLCALC 190 (H) 04/29/2023   TRIG 267 (H) 04/29/2023   CHOLHDL 4.8 (H) 04/29/2023   CHOLHDL 3.9  03/07/2023   CHOLHDL 4.8 (H) 01/20/2022   Lab Results  Component Value Date   ALT 15 02/02/2023   AST 26 02/02/2023   ALKPHOS 106 02/02/2023   BILITOT 0.5 02/02/2023   The 10-year ASCVD risk score (Arnett DK, et al., 2019) is: 28.3%   Values used to calculate the score:     Age: 67 years     Sex: Female     Is Non-Hispanic African American: Yes     Diabetic: Yes     Tobacco smoker: No     Systolic Blood Pressure: 132 mmHg     Is BP treated: Yes     HDL Cholesterol: 63 mg/dL     Total Cholesterol: 304 mg/dL  Plan: Continue statin. ***    Vitamin D Deficiency Vitamin D {CHL AMB IS/IS NOT:210130109} at goal of 50.  Most recent vitamin D level was ***. She is on {dwwslvitdrx:29084}. Lab Results  Component Value Date   VD25OH 23.1 (L) 02/02/2023   VD25OH 23.5 (L) 10/15/2022   VD25OH 41.6 04/22/2020    Plan: {  dwwmed:29123} {dwwslvitdrx:29084}   ATTESTASTION STATEMENTS:  Reviewed by clinician on day of visit: allergies, medications, problem list, medical history, surgical history, family history, social history, and previous encounter notes.   I have personally spent 30 minutes total time today in preparation, patient care, nutritional counseling and documentation for this visit, including the following: review of clinical lab tests; review of medical tests/procedures/services.      Kharon Hixon, PA-C

## 2023-07-04 ENCOUNTER — Ambulatory Visit (INDEPENDENT_AMBULATORY_CARE_PROVIDER_SITE_OTHER): Payer: Medicare Other | Admitting: Physician Assistant

## 2023-07-04 ENCOUNTER — Other Ambulatory Visit: Payer: Self-pay | Admitting: Nurse Practitioner

## 2023-07-04 DIAGNOSIS — E669 Obesity, unspecified: Secondary | ICD-10-CM

## 2023-07-04 DIAGNOSIS — F5089 Other specified eating disorder: Secondary | ICD-10-CM

## 2023-07-04 DIAGNOSIS — E782 Mixed hyperlipidemia: Secondary | ICD-10-CM

## 2023-07-04 DIAGNOSIS — I10 Essential (primary) hypertension: Secondary | ICD-10-CM

## 2023-07-04 DIAGNOSIS — E559 Vitamin D deficiency, unspecified: Secondary | ICD-10-CM

## 2023-07-04 DIAGNOSIS — R7303 Prediabetes: Secondary | ICD-10-CM

## 2023-07-04 MED ORDER — AMLODIPINE BESYLATE 5 MG PO TABS
5.0000 mg | ORAL_TABLET | Freq: Every day | ORAL | 1 refills | Status: DC
Start: 2023-07-04 — End: 2023-07-05

## 2023-07-05 ENCOUNTER — Other Ambulatory Visit: Payer: Self-pay

## 2023-07-05 ENCOUNTER — Other Ambulatory Visit (INDEPENDENT_AMBULATORY_CARE_PROVIDER_SITE_OTHER): Payer: Self-pay | Admitting: Family Medicine

## 2023-07-05 DIAGNOSIS — F5089 Other specified eating disorder: Secondary | ICD-10-CM

## 2023-07-05 DIAGNOSIS — I10 Essential (primary) hypertension: Secondary | ICD-10-CM

## 2023-07-05 MED ORDER — AMLODIPINE BESYLATE 5 MG PO TABS
5.0000 mg | ORAL_TABLET | Freq: Every day | ORAL | 1 refills | Status: DC
Start: 2023-07-05 — End: 2023-12-02

## 2023-08-09 ENCOUNTER — Encounter (HOSPITAL_BASED_OUTPATIENT_CLINIC_OR_DEPARTMENT_OTHER): Payer: Self-pay | Admitting: Internal Medicine

## 2023-08-09 ENCOUNTER — Ambulatory Visit (INDEPENDENT_AMBULATORY_CARE_PROVIDER_SITE_OTHER): Payer: Medicare Other | Admitting: Internal Medicine

## 2023-08-09 VITALS — BP 144/84 | HR 70 | Ht 63.0 in | Wt 220.4 lb

## 2023-08-09 DIAGNOSIS — E781 Pure hyperglyceridemia: Secondary | ICD-10-CM | POA: Diagnosis not present

## 2023-08-09 DIAGNOSIS — E7800 Pure hypercholesterolemia, unspecified: Secondary | ICD-10-CM

## 2023-08-09 NOTE — Patient Instructions (Signed)
Medication Instructions:  RESTART YOUR REPATHA EVERY 14 DAYS   *If you need a refill on your cardiac medications before your next appointment, please call your pharmacy*  Lab Work: FASTING NMR/LPa IN 6-8 WEEKS   If you have labs (blood work) drawn today and your tests are completely normal, you will receive your results only by: MyChart Message (if you have MyChart) OR A paper copy in the mail If you have any lab test that is abnormal or we need to change your treatment, we will call you to review the results.  Testing/Procedures: CALCIUM SCORE - THIS WILL COST YOU $99 OUT OF POCKET   Follow-Up: At [facility], you and your health needs are our priority.  As part of our continuing mission to provide you with exceptional heart care, we have created designated Provider Care Teams.  These Care Teams include your primary Cardiologist (physician) and Advanced Practice Providers (APPs -  Physician Assistants and Nurse Practitioners) who all work together to provide you with the care you need, when you need it.  We recommend signing up for the patient portal called "MyChart".  Sign up information is provided on this After Visit Summary.  MyChart is used to connect with patients for Virtual Visits (Telemedicine).  Patients are able to view lab/test results, encounter notes, upcoming appointments, etc.  Non-urgent messages can be sent to your provider as well.   To learn more about what you can do with MyChart, go to [email or web address].    Your next appointment:   3 month(s)  Provider:   K. Italy Hilty, MD

## 2023-08-09 NOTE — Progress Notes (Signed)
LIPID CLINIC CONSULT NOTE  Chief Complaint:  Manage dyslipidemia  Primary Care Physician: Donell Beers, FNP  Primary Cardiologist:  None  HPI:  Rebecca [name] is a 67 y.o. female who is being seen today for the evaluation of dyslipidemia at the request of Donell Beers, FNP.  This is a pleasant 67 year old female kindly referred for evaluation and management of dyslipidemia.  She had a recent lipid profile which shows significantly elevated cholesterol including total of 304, triglycerides 267, HDL 63 and LDL 190.  She was advised to take statins in the past and reports being prescribed them although she said she never took them because she worked in social work and she remembers a lot of people that complained about side effects on statins.  Somehow she was eventually prescribed a PCSK9 inhibitor and did take 1 dose of Repatha.  She felt like she might have some knee pain with that and or runny nose for a day or 2 and decided not to take any further doses.  These recent lipid numbers are off of therapy.  She does have risk of heart disease in the family.  She reports her diet is variable and in the past she has been able to clean it up and lose more weight.  PMHx:  Past Medical History:  Diagnosis Date   Acid reflux    Anxiety    High blood pressure    Hyperlipidemia    Neck pain    Obesity    Peritonsillar abscess 02/03/2017   PTSD (post-traumatic stress disorder)    Sleep deprivation    Thyroid disease    monitoring a nodule 1/9    Past Surgical History:  Procedure Laterality Date   BREAST LUMPECTOMY     LAPAROSCOPIC TOTAL HYSTERECTOMY  2012    FAMHx:  Family History  Problem Relation Age of Onset   Cancer Mother        pancreatic cancer   Diabetes Mother    Heart disease Mother        rheumatic heart disease   Hyperlipidemia Mother    High blood pressure Mother    Cancer Father        renal, bone   Diabetes Father    Hyperlipidemia Father     High blood pressure Father    COPD Sister    Sleep apnea Sister    Cancer Sister        Lung Cancer   Cancer Brother        lung cancer   Kidney disease Brother    Heart attack Maternal Grandfather    Colon cancer Maternal Aunt     SOCHx:   reports that she has never smoked. She has never used smokeless tobacco. She reports current alcohol use of about 5.0 - 7.0 standard drinks of alcohol per week. She reports that she does not use drugs.  ALLERGIES:  Allergies  Allergen Reactions   Prednisolone Other (See Comments)    Patient can't remember  Other reaction(s): Delusions (intolerance) Patient can't remember    Prednisone     Other reaction(s): BAD DREAMS/HALLUCINATIONS    ROS: Pertinent items noted in HPI and remainder of comprehensive ROS otherwise negative.  HOME MEDS: Current Outpatient Medications on File Prior to Visit  Medication Sig Dispense Refill   sertraline (ZOLOFT) 25 MG tablet Take 25 mg by mouth as needed.     acetaminophen (TYLENOL) 500 MG tablet Take 1 tablet (500 mg total) by mouth every  6 (six) hours as needed. 30 tablet 0   Albuterol Sulfate (PROAIR RESPICLICK) 108 (90 Base) MCG/ACT AEPB Inhale into the lungs. 2 puffs every four hrs as needed     amLODipine (NORVASC) 5 MG tablet Take 1 tablet (5 mg total) by mouth daily. 90 tablet 1   B Complex Vitamins (B-COMPLEX/B-12 PO) Take by mouth.     cholecalciferol (VITAMIN D) 1000 units tablet Take 5,000 Units by mouth daily.     cyclobenzaprine (FLEXERIL) 10 MG tablet Take 1 tablet (10 mg total) by mouth 3 (three) times daily as needed for muscle spasms. 30 tablet 3   Evolocumab (REPATHA SURECLICK) 140 MG/ML SOAJ Inject 140 mg into the skin every 14 (fourteen) days. 2 mL 3   famotidine (PEPCID) 20 MG tablet Take 20 mg by mouth 2 (two) times daily.     hydrOXYzine (VISTARIL) 25 MG capsule Take 1 capsule (25 mg total) by mouth at bedtime as needed. 30 capsule 0   Omega-3 Fatty Acids (FISH OIL) 1000 MG CAPS Take by  mouth.     topiramate (TOPAMAX) 25 MG tablet Take 1 tablet (25 mg total) by mouth daily with supper. 30 tablet 0   traZODone (DESYREL) 50 MG tablet Take 0.5-1 tablets (25-50 mg total) by mouth at bedtime as needed for sleep. 90 tablet 3   [DISCONTINUED] escitalopram (LEXAPRO) 10 MG tablet Take 1 tablet (10 mg total) by mouth daily. (Patient not taking: Reported on 08/19/2018) 30 tablet 0   No current facility-administered medications on file prior to visit.    LABS/IMAGING: No results found for this or any previous visit (from the past 48 hour(s)). No results found.  LIPID PANEL:    Component Value Date/Time   CHOL 304 (H) 04/29/2023 1344   TRIG 267 (H) 04/29/2023 1344   HDL 63 04/29/2023 1344   CHOLHDL 4.8 (H) 04/29/2023 1344   CHOLHDL 3.8 05/09/2017 0902   VLDL 28 05/09/2017 0902   LDLCALC 190 (H) 04/29/2023 1344    WEIGHTS: Wt Readings from Last 3 Encounters:  08/09/23 220 lb 6.4 oz (100 kg)  05/09/23 216 lb (98 kg)  04/29/23 220 lb (99.8 kg)    VITALS: BP (!) 144/84 (BP Location: Left Arm, Patient Position: Sitting, Cuff Size: Normal) Comment: Pt says didnt take med today  Pulse 70   Ht 5\' 3"  (1.6 m)   Wt 220 lb 6.4 oz (100 kg)   SpO2 95%   BMI 39.04 kg/m   EXAM: Deferred  EKG: Deferred  ASSESSMENT: Possible familial hyperlipidemia or familial combined hyperlipidemia, LDL greater than 190 High triglycerides Statin averse Family history of cardiovascular disease  PLAN: 1.   Ms. Vaquera has a possible familial hyperlipidemia with very high LDL and elevated triglycerides.  This could also be a familial combined hyperlipidemia.  She was averse to statins but was able to get approved for PCSK9 inhibitor by her PCP.  She took 1 dose and may have had side effects although she wonders if some of that was somatic.  Since she has a filled prescription and medication at home, advised her to restart the Repatha and monitor for potential side effects.  Since she knows  what to expect I think it will be well-tolerated.  Will then plan repeat lipids including NMR and LP(a) which may be elevated.  Based on that we could further titrate her therapies.  She is in agreement with this approach.  I also think will be beneficial to obtain a calcium score to  determine if there is any underlying coronary artery disease.  Thanks again for the kind referral.  Plan follow-up with me in about 3 to 4 months.  Chrystie Nose, MD, [facility], FACP  [facility]  Medical Director of the Advanced Lipid Disorders &  Cardiovascular Risk Reduction Clinic Diplomate of the American Board of Clinical Lipidology Attending Cardiologist  Direct Dial: [phone]  Fax: [phone]  Website:  [email or web address] Rye Dorado 08/09/2023, 2:55 PM

## 2023-08-29 ENCOUNTER — Ambulatory Visit (INDEPENDENT_AMBULATORY_CARE_PROVIDER_SITE_OTHER): Payer: Medicare Other | Admitting: Nurse Practitioner

## 2023-08-29 ENCOUNTER — Encounter: Payer: Self-pay | Admitting: Nurse Practitioner

## 2023-08-29 VITALS — BP 142/54 | HR 86 | Temp 97.0°F | Wt 214.6 lb

## 2023-08-29 DIAGNOSIS — I10 Essential (primary) hypertension: Secondary | ICD-10-CM

## 2023-08-29 DIAGNOSIS — Z780 Asymptomatic menopausal state: Secondary | ICD-10-CM

## 2023-08-29 DIAGNOSIS — Z6839 Body mass index (BMI) 39.0-39.9, adult: Secondary | ICD-10-CM

## 2023-08-29 DIAGNOSIS — G4700 Insomnia, unspecified: Secondary | ICD-10-CM

## 2023-08-29 DIAGNOSIS — Z23 Encounter for immunization: Secondary | ICD-10-CM | POA: Diagnosis not present

## 2023-08-29 DIAGNOSIS — E783 Hyperchylomicronemia: Secondary | ICD-10-CM

## 2023-08-29 DIAGNOSIS — R7303 Prediabetes: Secondary | ICD-10-CM | POA: Diagnosis not present

## 2023-08-29 DIAGNOSIS — G4733 Obstructive sleep apnea (adult) (pediatric): Secondary | ICD-10-CM

## 2023-08-29 DIAGNOSIS — K219 Gastro-esophageal reflux disease without esophagitis: Secondary | ICD-10-CM | POA: Diagnosis not present

## 2023-08-29 DIAGNOSIS — E66812 Obesity, class 2: Secondary | ICD-10-CM | POA: Diagnosis not present

## 2023-08-29 DIAGNOSIS — M4722 Other spondylosis with radiculopathy, cervical region: Secondary | ICD-10-CM

## 2023-08-29 DIAGNOSIS — Z1231 Encounter for screening mammogram for malignant neoplasm of breast: Secondary | ICD-10-CM

## 2023-08-29 LAB — POCT GLYCOSYLATED HEMOGLOBIN (HGB A1C): Hemoglobin A1C: 5.5 % (ref 4.0–5.6)

## 2023-08-29 MED ORDER — PANTOPRAZOLE SODIUM 40 MG PO TBEC: 40.0000 mg | DELAYED_RELEASE_TABLET | Freq: Every day | ORAL | 3 refills | Status: DC

## 2023-08-29 NOTE — Assessment & Plan Note (Addendum)
Wt Readings from Last 3 Encounters:  08/29/23 214 lb 9.6 oz (97.3 kg)  08/09/23 220 lb 6.4 oz (100 kg)  05/09/23 216 lb (98 kg)  Body mass index is 38.01 kg/m.  She was started on Topamax by the medical weight management specialist, patient did not take the medication Not going back to medical management clinic Patient counseled on low-carb modified diet Encouraged to engage in regular moderate exercises at least 150 minutes weekly as tolerated

## 2023-08-29 NOTE — Patient Instructions (Signed)
Please consider getting Shingrix and Tdap and influenza vaccine at local pharmacy.   You were given Pneumovax 13 in the office today   Gastroesophageal reflux disease, unspecified whether esophagitis present  - pantoprazole (PROTONIX) 40 MG tablet; Take 1 tablet (40 mg total) by mouth daily.  Dispense: 30 tablet; Refill: 3   Post-menopausal screening for osteoporosis  - DG Bone Density; Future please call [phone] to schedule an appointment    Around 3 times per week, check your blood pressure 2 times per day. once in the morning and once in the evening. The readings should be at least one minute apart. Write down these values and bring them to your next nurse visit/appointment.  When you check your BP, make sure you have been doing something calm/relaxing 5 minutes prior to checking. Both feet should be flat on the floor and you should be sitting. Use your left arm and make sure it is in a relaxed position (on a table), and that the cuff is at the approximate level/height of your heart.   Blood pressure goal is less than 140/90

## 2023-08-29 NOTE — Assessment & Plan Note (Signed)
Sleep hygiene discussed Encouraged to consider PAP therapy Continue trazodone 25 to 50 mg at bedtime as needed

## 2023-08-29 NOTE — Assessment & Plan Note (Signed)
She plans restarting Repatha injection this week Patient encouraged to take Repatha 140 mg injection every 4 weeks as ordered Encouraged to maintain close follow-up with Dr. Rennis Golden Avoid fatty fried foods Lose weight

## 2023-08-29 NOTE — Assessment & Plan Note (Signed)
BP Readings from Last 3 Encounters:  08/29/23 (!) 142/54  08/09/23 (!) 144/84  05/09/23 132/85  Currently on amlodipine 5 mg daily Continue current medication Importance of adequate rest discussed, has not slept well due to preparing for [employer] homecoming  Follow-up in 4 weeks if blood pressure remains elevated will add another blood pressure medication Discussed DASH diet and dietary sodium restrictions Continue to increase dietary efforts and exercise.

## 2023-08-29 NOTE — Assessment & Plan Note (Signed)
Lab Results  Component Value Date   HGBA1C 5.5 08/29/2023  Stable condition Avoid sugar sweets soda Lose weight
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
## 2023-08-29 NOTE — Assessment & Plan Note (Signed)
Given- Pneumococcal conjugate vaccine 13-valent IM Patient educated on CDC recommendation for the vaccine. Verbal consent was obtained from the patient, vaccine administered by nurse, no sign of adverse reactions noted at this time. Patient education on arm soreness and use of tylenol for this patient  was discussed. Patient educated on the signs and symptoms of adverse effect and advise to contact the office if they occur. Vaccine information sheet given to patient.

## 2023-08-29 NOTE — Assessment & Plan Note (Signed)
Pantoprazole 40 mg daily ordered Avoid fatty fried foods, spicy food, alcohol, and other foods that triggers acid reflux symptoms

## 2023-08-29 NOTE — Assessment & Plan Note (Addendum)
Does not want PAP therapy Encouraged the patient to consider PAP therapy as her condition might be contributing to her uncontrolled blood pressure
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
## 2023-08-30 LAB — CMP14+EGFR
ALT: 17 [IU]/L (ref 0–32)
AST: 50 [IU]/L — ABNORMAL HIGH (ref 0–40)
Albumin: 4.7 g/dL (ref 3.9–4.9)
Alkaline Phosphatase: 111 [IU]/L (ref 44–121)
BUN/Creatinine Ratio: 18 (ref 12–28)
BUN: 11 mg/dL (ref 8–27)
Bilirubin Total: 0.4 mg/dL (ref 0.0–1.2)
CO2: 24 mmol/L (ref 20–29)
Calcium: 9.7 mg/dL (ref 8.7–10.3)
Chloride: 99 mmol/L (ref 96–106)
Creatinine, Ser: 0.61 mg/dL (ref 0.57–1.00)
Globulin, Total: 3.1 g/dL (ref 1.5–4.5)
Glucose: 84 mg/dL (ref 70–99)
Potassium: 3.9 mmol/L (ref 3.5–5.2)
Sodium: 141 mmol/L (ref 134–144)
Total Protein: 7.8 g/dL (ref 6.0–8.5)
eGFR: 99 mL/min/{1.73_m2} (ref >59)

## 2023-09-02 ENCOUNTER — Other Ambulatory Visit (HOSPITAL_BASED_OUTPATIENT_CLINIC_OR_DEPARTMENT_OTHER): Payer: Medicare Other

## 2023-09-15 ENCOUNTER — Encounter (HOSPITAL_BASED_OUTPATIENT_CLINIC_OR_DEPARTMENT_OTHER): Payer: Self-pay

## 2023-09-19 ENCOUNTER — Encounter: Payer: Self-pay | Admitting: Nurse Practitioner

## 2023-09-28 ENCOUNTER — Ambulatory Visit: Payer: Self-pay | Admitting: Nurse Practitioner

## 2023-10-31 ENCOUNTER — Ambulatory Visit (HOSPITAL_BASED_OUTPATIENT_CLINIC_OR_DEPARTMENT_OTHER)
Admission: RE | Admit: 2023-10-31 | Discharge: 2023-10-31 | Disposition: A | Discharge status: Home / Self Care | Payer: Self-pay | Source: Ambulatory Visit | Attending: Internal Medicine | Admitting: Internal Medicine

## 2023-10-31 DIAGNOSIS — E7800 Pure hypercholesterolemia, unspecified: Secondary | ICD-10-CM | POA: Insufficient documentation

## 2023-11-11 ENCOUNTER — Other Ambulatory Visit (HOSPITAL_BASED_OUTPATIENT_CLINIC_OR_DEPARTMENT_OTHER): Payer: Self-pay | Admitting: *Deleted

## 2023-11-11 DIAGNOSIS — J479 Bronchiectasis, uncomplicated: Secondary | ICD-10-CM

## 2023-11-11 DIAGNOSIS — R9389 Abnormal findings on diagnostic imaging of other specified body structures: Secondary | ICD-10-CM

## 2023-12-02 ENCOUNTER — Encounter (HOSPITAL_BASED_OUTPATIENT_CLINIC_OR_DEPARTMENT_OTHER): Payer: Self-pay | Admitting: Internal Medicine

## 2023-12-02 ENCOUNTER — Ambulatory Visit (INDEPENDENT_AMBULATORY_CARE_PROVIDER_SITE_OTHER): Payer: Medicare Other | Admitting: Internal Medicine

## 2023-12-02 VITALS — BP 132/70 | HR 78 | Ht 63.0 in | Wt 215.0 lb

## 2023-12-02 DIAGNOSIS — E7800 Pure hypercholesterolemia, unspecified: Secondary | ICD-10-CM | POA: Diagnosis not present

## 2023-12-02 DIAGNOSIS — J8410 Pulmonary fibrosis, unspecified: Secondary | ICD-10-CM

## 2023-12-02 NOTE — Patient Instructions (Signed)
Medication Instructions:  START Repatha as prescribed  -- after the lab work is completed  *If you need a refill on your cardiac medications before your next appointment, please call your pharmacy*   Lab Work: FASTING NMR lipoprofile and LPa as soon as able   Will plan to repeat fasting labs in 4 months  If you have labs (blood work) drawn today and your tests are completely normal, you will receive your results only by: MyChart Message (if you have MyChart) OR A paper copy in the mail If you have any lab test that is abnormal or we need to change your treatment, we will call you to review the results.   Follow-Up: At [facility], you and your health needs are our priority.  As part of our continuing mission to provide you with exceptional heart care, we have created designated Provider Care Teams.  These Care Teams include your primary Cardiologist (physician) and Advanced Practice Providers (APPs -  Physician Assistants and Nurse Practitioners) who all work together to provide you with the care you need, when you need it.  We recommend signing up for the patient portal called "MyChart".  Sign up information is provided on this After Visit Summary.  MyChart is used to connect with patients for Virtual Visits (Telemedicine).  Patients are able to view lab/test results, encounter notes, upcoming appointments, etc.  Non-urgent messages can be sent to your provider as well.   To learn more about what you can do with MyChart, go to [email or web address].    Your next appointment:   4 months with Dr. Rennis Golden or Eligha Bridegroom, NP for lipid clinic  Please go to 3rd floor to see about scheduling an appointment with the lung doctor [facility] [address] 3rd Floor [place], [place] [address] OR [address] Floor - [place], [place]  [address]

## 2023-12-02 NOTE — Progress Notes (Unsigned)
LIPID CLINIC CONSULT NOTE  Chief Complaint:  Manage dyslipidemia  Primary Care Physician: Donell Beers, FNP  Primary Cardiologist:  None  HPI:  Rebecca [name] is a 68 y.o. female who is being seen today for the evaluation of dyslipidemia at the request of Donell Beers, FNP.  This is a pleasant 68 year old female kindly referred for evaluation and management of dyslipidemia.  She had a recent lipid profile which shows significantly elevated cholesterol including total of 304, triglycerides 267, HDL 63 and LDL 190.  She was advised to take statins in the past and reports being prescribed them although she said she never took them because she worked in social work and she remembers a lot of people that complained about side effects on statins.  Somehow she was eventually prescribed a PCSK9 inhibitor and did take 1 dose of Repatha.  She felt like she might have some knee pain with that and or runny nose for a day or 2 and decided not to take any further doses.  These recent lipid numbers are off of therapy.  She does have risk of heart disease in the family.  She reports her diet is variable and in the past she has been able to clean it up and lose more weight.  12/02/2023  Rebecca [name] returns today for follow-up.  We have previously discussed restarting Repatha however unfortunately she did not do that.  She has had a lot of other things going on.  She also has not had recent lab work.  Previously though her labs in June showed an LDL of 190 with triglycerides 267 and total cholesterol 304.  She understands a significant elevation of this the rationale for using Repatha.  She also has filled her prescription and has some medication on hand at home.  We did do a recent calcium score which was low at 1.5 however this represented 63rd percentile for age and sex matched controls.  The radiology over read suggested underlying interstitial lung disease.  Pulmonary referral was  recommended however has not been scheduled.  PMHx:  Past Medical History:  Diagnosis Date   Acid reflux    Anxiety    High blood pressure    Hyperlipidemia    Neck pain    Obesity    Peritonsillar abscess 02/03/2017   PTSD (post-traumatic stress disorder)    Sleep deprivation    Thyroid disease    monitoring a nodule 1/9    Past Surgical History:  Procedure Laterality Date   BREAST LUMPECTOMY     LAPAROSCOPIC TOTAL HYSTERECTOMY  2012    FAMHx:  Family History  Problem Relation Age of Onset   Cancer Mother        pancreatic cancer   Diabetes Mother    Heart disease Mother        rheumatic heart disease   Hyperlipidemia Mother    High blood pressure Mother    Cancer Father        renal, bone   Diabetes Father    Hyperlipidemia Father    High blood pressure Father    COPD Sister    Sleep apnea Sister    Cancer Sister        Lung Cancer   Cancer Brother        lung cancer   Kidney disease Brother    Heart attack Maternal Grandfather    Colon cancer Maternal Aunt     SOCHx:   reports that she has never  smoked. She has never used smokeless tobacco. She reports current alcohol use of about 5.0 - 7.0 standard drinks of alcohol per week. She reports that she does not use drugs.  ALLERGIES:  Allergies  Allergen Reactions   Prednisolone Other (See Comments)    Patient can't remember  Other reaction(s): Delusions (intolerance) Patient can't remember    Prednisone     Other reaction(s): BAD DREAMS/HALLUCINATIONS    ROS: Pertinent items noted in HPI and remainder of comprehensive ROS otherwise negative.  HOME MEDS: Current Outpatient Medications on File Prior to Visit  Medication Sig Dispense Refill   acetaminophen (TYLENOL) 500 MG tablet Take 1 tablet (500 mg total) by mouth every 6 (six) hours as needed. 30 tablet 0   amLODipine (NORVASC) 10 MG tablet Take 10 mg by mouth daily.     B Complex Vitamins (B-COMPLEX/B-12 PO) Take by mouth.     cholecalciferol  (VITAMIN D) 1000 units tablet Take 5,000 Units by mouth daily.     cyclobenzaprine (FLEXERIL) 10 MG tablet Take 1 tablet (10 mg total) by mouth 3 (three) times daily as needed for muscle spasms. 30 tablet 3   hydrOXYzine (VISTARIL) 25 MG capsule Take 1 capsule (25 mg total) by mouth at bedtime as needed. 30 capsule 0   Omega-3 Fatty Acids (FISH OIL) 1000 MG CAPS Take by mouth.     pantoprazole (PROTONIX) 40 MG tablet Take 1 tablet (40 mg total) by mouth daily. 30 tablet 3   traZODone (DESYREL) 50 MG tablet Take 0.5-1 tablets (25-50 mg total) by mouth at bedtime as needed for sleep. 90 tablet 3   Albuterol Sulfate (PROAIR RESPICLICK) 108 (90 Base) MCG/ACT AEPB Inhale into the lungs. 2 puffs every four hrs as needed (Patient not taking: Reported on 12/02/2023)     Evolocumab (REPATHA SURECLICK) 140 MG/ML SOAJ Inject 140 mg into the skin every 14 (fourteen) days. (Patient not taking: Reported on 12/02/2023) 2 mL 3   topiramate (TOPAMAX) 25 MG tablet Take 1 tablet (25 mg total) by mouth daily with supper. (Patient not taking: Reported on 12/02/2023) 30 tablet 0   [DISCONTINUED] escitalopram (LEXAPRO) 10 MG tablet Take 1 tablet (10 mg total) by mouth daily. (Patient not taking: Reported on 08/19/2018) 30 tablet 0   No current facility-administered medications on file prior to visit.    LABS/IMAGING: No results found for this or any previous visit (from the past 48 hours). No results found.  LIPID PANEL:    Component Value Date/Time   CHOL 304 (H) 04/29/2023 1344   TRIG 267 (H) 04/29/2023 1344   HDL 63 04/29/2023 1344   CHOLHDL 4.8 (H) 04/29/2023 1344   CHOLHDL 3.8 05/09/2017 0902   VLDL 28 05/09/2017 0902   LDLCALC 190 (H) 04/29/2023 1344    WEIGHTS: Wt Readings from Last 3 Encounters:  08/29/23 214 lb 9.6 oz (97.3 kg)  08/09/23 220 lb 6.4 oz (100 kg)  05/09/23 216 lb (98 kg)    VITALS: There were no vitals taken for this  visit.  EXAM: Deferred  EKG: Deferred  ASSESSMENT: Possible familial hyperlipidemia or familial combined hyperlipidemia, LDL greater than 190 High triglycerides Statin averse Family history of cardiovascular disease Imaging evidence of interstitial Neri fibrosis  PLAN: 1.   Rebecca [name] has concerning high cholesterol for familial hyperlipidemia.  She has been approved for Repatha but has not started taking it.  I have encouraged her to start with the medication and we will repeat a lipid profile.  In addition  we have encouraged her to reach out to pulmonary to schedule a follow-up of her CT which suggested some interstitial lung disease on the imaging.  Otherwise follow-up in about 4 months with repeat lipid testing including NMR and LP(a).  Chrystie Nose, MD, [facility], FACP  [place]  [facility]  Medical Director of the Advanced Lipid Disorders &  Cardiovascular Risk Reduction Clinic Diplomate of the American Board of Clinical Lipidology Attending Cardiologist  Direct Dial: [phone]  Fax: [phone]  Website:  [email or web address]  Lisette Abu Bernadene Garside 12/02/2023, 1:30 PM

## 2023-12-27 LAB — NMR, LIPOPROFILE
Cholesterol, Total: 380 mg/dL — ABNORMAL HIGH (ref 100–199)
HDL Particle Number: 38.3 umol/L (ref >=30.5)
HDL-C: 66 mg/dL (ref >39)
LDL Particle Number: 3220 nmol/L — ABNORMAL HIGH (ref <1000)
LDL Size: 20.6 nmol (ref >20.5)
LDL-C (NIH Calc): 252 mg/dL — ABNORMAL HIGH (ref 0–99)
LP-IR Score: 52 — ABNORMAL HIGH (ref <=45)
Small LDL Particle Number: 1666 nmol/L — ABNORMAL HIGH (ref <=527)
Triglycerides: 289 mg/dL — ABNORMAL HIGH (ref 0–149)

## 2023-12-27 LAB — LIPOPROTEIN A (LPA): Lipoprotein (a): 79.8 nmol/L — ABNORMAL HIGH (ref <75.0)

## 2023-12-28 ENCOUNTER — Other Ambulatory Visit: Payer: Self-pay | Admitting: Nurse Practitioner

## 2023-12-28 DIAGNOSIS — K219 Gastro-esophageal reflux disease without esophagitis: Secondary | ICD-10-CM

## 2024-01-19 ENCOUNTER — Ambulatory Visit: Payer: Medicare Other | Admitting: Internal Medicine

## 2024-01-19 ENCOUNTER — Encounter: Payer: Self-pay | Admitting: Internal Medicine

## 2024-01-19 VITALS — BP 147/87 | HR 65 | Temp 98.1°F | Ht 63.0 in | Wt 219.0 lb

## 2024-01-19 DIAGNOSIS — K219 Gastro-esophageal reflux disease without esophagitis: Secondary | ICD-10-CM | POA: Diagnosis not present

## 2024-01-19 DIAGNOSIS — R918 Other nonspecific abnormal finding of lung field: Secondary | ICD-10-CM

## 2024-01-19 DIAGNOSIS — R053 Chronic cough: Secondary | ICD-10-CM

## 2024-01-19 DIAGNOSIS — R9389 Abnormal findings on diagnostic imaging of other specified body structures: Secondary | ICD-10-CM

## 2024-01-19 NOTE — Patient Instructions (Addendum)
 ICD-10-CM   1. Abnormal CT of the chest  R93.89     2. Ground glass opacity present on imaging of lung  R91.8     3. Chronic cough  R05.3     4. Gastroesophageal reflux disease, unspecified whether esophagitis present  K21.9      I am not sure that you have interstitial lung disease at this point but you do seem to have interstitial inflammation in the lower part of the lung left greater than the right based on December 2024 CT scan of the chest.  Based on the history this might be related to viral infection that you had around Christmas 2024 or acid reflux.  The cough itself might be related to acid reflux  Plan - Strict acid reflux control at home  - sleep at home with the head end of the bed elevated  -Take prescription PPI that you already have on a regular basis on an empty stomach in the morning  -In addition take over-the-counter Pepcid 1 tablet at night 20 mg -Do high-resolution CT chest in 3 months - Do full pulmonary function test in 3 months -If cough is persisting at follow-up then we can consider further blood work and exam nitric oxide testing -Take ILD questionnaire and bring it back with you at next visit  -Overall I recommend expectant approach with the hope that this can probably be resolved at the time of follow-up  Follow-up - 15-minute visit in 3 months after pulmonary function testing and CT scan

## 2024-01-19 NOTE — Progress Notes (Signed)
 OV 01/19/2024  Subjective:  Patient ID: Rebecca [name], female , DOB: 1955/12/15 , age 68 y.o. , MRN: [id number] , ADDRESS: [address] [place] [address] PCP No primary care provider on file. No care team member to display  This Provider for this visit: Treatment Team:  Attending Provider: Kalman Shan, MD    01/19/2024 -   Chief Complaint  Patient presents with   Pulmonary Consult    Referred by Dr. Rennis Golden for possible ILD- had cardiac CT 10/30/24. She has occ cough- occ prod with clear sputum.      HPI Rebecca [name] 68 y.o. -new consult evaluation.  Referred by Dr. Rennis Golden.  She is a non-smoker.  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  She states that high school she used to play basketball.  She has obesity and hyperlipidemia she is on medications for that.  She also has acid reflux which she does not take her medications quite well few years ago she had esophagogram barium swallow study and she did have some esophageal dysmotility.  She admits to ongoing acid reflux.  Around Christmas 2024 she had a cardiac CT [she now admits that around the time she also had a viral infection] this shows lower lobe left greater than right groundglass opacities/interstitial lung inflammation.  Therefore she has been referred here.  Currently she endorses chronic cough for the last 1 year on and off associated with acid reflux.  It is dry.  It is mild.  But there is no shortness of breath.  For the last 1 month she has been working out on a treadmill for 60 minutes 2.5 to 4 mph with an incline that is similar.  She feels fine with this no shortness of breath.  At home she does not have any pet birds no down so far down pillow no mold does not smoke.  She does have acid reflux symptoms and sleeps with the head end of the bed elevated but is not diligent about taking her  medications.      CT Chest data from date: BELOW  - personally visualized and independently interpreted : yes - my findings are: agree  ADDENDUM REPORT: 11/02/2023 13:21   EXAM: OVER-READ INTERPRETATION  CT CHEST   The following report is an over-read performed by radiologist Dr. Royal Piedra [facility] on 11/02/2023. This over-read does not include interpretation of cardiac or coronary anatomy or pathology. The coronary calcium score interpretation by the cardiologist is attached.   COMPARISON:  None available.   FINDINGS: Areas of ground-glass attenuation, septal thickening, thickening of the peribronchovascular interstitium and bronchiectasis are noted in the lower lungs bilaterally. Within the visualized portions of the thorax there are no suspicious appearing pulmonary nodules or masses, there is no acute consolidative airspace disease, no pleural effusions, no pneumothorax and no lymphadenopathy. Visualized portions of the upper abdomen are unremarkable. There are no aggressive appearing lytic or blastic lesions noted in the visualized portions of the skeleton.   IMPRESSION: 1. The appearance of the visualized lung bases is strongly suggestive of underlying interstitial lung disease. Outpatient referral to Pulmonology for further clinical evaluation is recommended. Follow-up nonemergent high-resolution chest CT should also be considered to better characterize these findings in the near future.     Electronically Signed   By: Brayton Mars.D.  On: 11/02/2023 13:21 PFT      No data to display             LAB RESULTS last 96 hours No results found.       has a past medical history of Acid reflux, Anxiety, High blood pressure, Hyperlipidemia, Neck pain, Obesity, Peritonsillar abscess (02/03/2017), PTSD (post-traumatic stress disorder), Sleep deprivation, and Thyroid disease.   reports that she has never smoked. She has been  exposed to tobacco smoke. She has never used smokeless tobacco.  Past Surgical History:  Procedure Laterality Date   BREAST LUMPECTOMY     LAPAROSCOPIC TOTAL HYSTERECTOMY  2012    Allergies  Allergen Reactions   Prednisolone Other (See Comments)    Patient can't remember  Other reaction(s): Delusions (intolerance) Patient can't remember    Prednisone     Other reaction(s): BAD DREAMS/HALLUCINATIONS    Immunization History  Administered Date(s) Administered   Influenza Split 08/10/2012   Moderna Sars-Covid-2 Vaccination 01/13/2020, 02/14/2020, 09/16/2020   Pneumococcal Conjugate-13 08/29/2023   Td 04/27/2013    Family History  Problem Relation Age of Onset   Cancer Mother        pancreatic cancer   Diabetes Mother    Heart disease Mother        rheumatic heart disease   Hyperlipidemia Mother    High blood pressure Mother    Cancer Father        renal, bone   Diabetes Father    Hyperlipidemia Father    High blood pressure Father    COPD Sister    Sleep apnea Sister    Lung cancer Sister        asbestosis caused lung CA   Lung cancer Brother        smoked   Kidney disease Brother    Heart attack Maternal Grandfather    Colon cancer Maternal Aunt      Current Outpatient Medications:    B Complex Vitamins (B-COMPLEX/B-12 PO), Take by mouth., Disp: , Rfl:    cholecalciferol (VITAMIN D) 1000 units tablet, Take 5,000 Units by mouth daily., Disp: , Rfl:    Omega-3 Fatty Acids (FISH OIL) 1000 MG CAPS, Take by mouth., Disp: , Rfl:    pantoprazole (PROTONIX) 40 MG tablet, TAKE 1 TABLET(40 MG) BY MOUTH DAILY, Disp: 30 tablet, Rfl: 3   topiramate (TOPAMAX) 25 MG tablet, Take 1 tablet (25 mg total) by mouth daily with supper., Disp: 30 tablet, Rfl: 0   traZODone (DESYREL) 50 MG tablet, Take 0.5-1 tablets (25-50 mg total) by mouth at bedtime as needed for sleep., Disp: 90 tablet, Rfl: 3   Evolocumab (REPATHA SURECLICK) 140 MG/ML SOAJ, Inject 140 mg into the skin every 14  (fourteen) days. (Patient not taking: Reported on 01/19/2024), Disp: 2 mL, Rfl: 3      Objective:   Vitals:   01/19/24 0910 01/19/24 0912  BP:  (!) 147/87  Pulse: 65   Temp:  98.1 F (36.7 C)  TempSrc:  Oral  SpO2: 98%   Weight:  219 lb (99.3 kg)  Height:  5\' 3"  (1.6 m)    Estimated body mass index is 38.79 kg/m as calculated from the following:   Height as of this encounter: 5\' 3"  (1.6 m).   Weight as of this encounter: 219 lb (99.3 kg).  @WEIGHTCHANGE @  [employer]   01/19/24 0912  Weight: 219 lb (99.3 kg)     Physical Exam   General: No distress. obese O2 at  rest: no Cane present: no Sitting in wheel chair: no Frail: no Obese: no Neuro: Alert and Oriented x 3. GCS 15. Speech normal Psych: Pleasant Resp:  Barrel Chest - no.  Wheeze - non, Crackles - NO No overt respiratory distress CVS: Normal heart sounds. Murmurs - no Ext: Stigmata of Connective Tissue Disease - no HEENT: Normal upper airway. PEERL +. No post nasal drip        Assessment:       ICD-10-CM   1. Abnormal CT of the chest  R93.89 Pulmonary function test    CT Chest High Resolution    2. Ground glass opacity present on imaging of lung  R91.8 Pulmonary function test    CT Chest High Resolution    3. Chronic cough  R05.3 Pulmonary function test    CT Chest High Resolution    4. Gastroesophageal reflux disease, unspecified whether esophagitis present  K21.9 Pulmonary function test    CT Chest High Resolution         Plan:     Patient Instructions     ICD-10-CM   1. Abnormal CT of the chest  R93.89     2. Ground glass opacity present on imaging of lung  R91.8     3. Chronic cough  R05.3     4. Gastroesophageal reflux disease, unspecified whether esophagitis present  K21.9      I am not sure that you have interstitial lung disease at this point but you do seem to have interstitial inflammation in the lower part of the lung left greater than the right based on December 2024  CT scan of the chest.  Based on the history this might be related to viral infection that you had around Christmas 2024 or acid reflux.  The cough itself might be related to acid reflux  Plan - Strict acid reflux control at home  - sleep at home with the head end of the bed elevated  -Take prescription PPI that you already have on a regular basis on an empty stomach in the morning  -In addition take over-the-counter Pepcid 1 tablet at night 20 mg -Do high-resolution CT chest in 3 months - Do full pulmonary function test in 3 months -If cough is persisting at follow-up then we can consider further blood work and exam nitric oxide testing -Take ILD questionnaire and bring it back with you at next visit  -Overall I recommend expectant approach with the hope that this can probably be resolved at the time of follow-up  Follow-up - 15-minute visit in 3 months after pulmonary function testing and CT scan   FOLLOWUP Return in about 3 months (around 04/20/2024) for 15 min visit, with Dr Marchelle Gearing, Face to Face Visit, after PFT and CT.    SIGNATURE    Dr. Kalman Shan, M.D., F.C.C.P,  Pulmonary and Critical Care Medicine Staff Physician, [facility] Center Director - Interstitial Lung Disease  Program  Pulmonary Fibrosis [facility] at [facility] [place], [place], [address]  Pager: [phone], If no answer or between  15:00h - 7:00h: call [phone] Telephone: [phone]  9:43 AM 01/19/2024

## 2024-01-25 ENCOUNTER — Other Ambulatory Visit

## 2024-02-07 ENCOUNTER — Other Ambulatory Visit (HOSPITAL_BASED_OUTPATIENT_CLINIC_OR_DEPARTMENT_OTHER): Payer: Self-pay | Admitting: *Deleted

## 2024-02-07 DIAGNOSIS — E7800 Pure hypercholesterolemia, unspecified: Secondary | ICD-10-CM

## 2024-03-20 ENCOUNTER — Encounter (HOSPITAL_BASED_OUTPATIENT_CLINIC_OR_DEPARTMENT_OTHER): Payer: Self-pay | Admitting: Internal Medicine

## 2024-03-20 ENCOUNTER — Ambulatory Visit (HOSPITAL_BASED_OUTPATIENT_CLINIC_OR_DEPARTMENT_OTHER): Payer: Medicare Other | Admitting: Internal Medicine

## 2024-03-20 VITALS — BP 152/90 | HR 84 | Ht 63.0 in | Wt 216.0 lb

## 2024-03-20 DIAGNOSIS — E781 Pure hyperglyceridemia: Secondary | ICD-10-CM

## 2024-03-20 DIAGNOSIS — E7841 Elevated Lipoprotein(a): Secondary | ICD-10-CM | POA: Diagnosis not present

## 2024-03-20 DIAGNOSIS — E7800 Pure hypercholesterolemia, unspecified: Secondary | ICD-10-CM

## 2024-03-20 NOTE — Patient Instructions (Signed)
 Medication Instructions:  NO CHANGES  *If you need a refill on your cardiac medications before your next appointment, please call your pharmacy*  Lab Work: LAB WORK today -- 3rd Floor  If you have labs (blood work) drawn today and your tests are completely normal, you will receive your results only by: MyChart Message (if you have MyChart) OR A paper copy in the mail If you have any lab test that is abnormal or we need to change your treatment, we will call you to review the results.  Follow-Up: At [facility], you and your health needs are our priority.  As part of our continuing mission to provide you with exceptional heart care, our providers are all part of one team.  This team includes your primary Cardiologist (physician) and Advanced Practice Providers or APPs (Physician Assistants and Nurse Practitioners) who all work together to provide you with the care you need, when you need it.  Your next appointment:    12 months with Dr. Maximo Spar or Slater Duncan NP  We recommend signing up for the patient portal called "MyChart".  Sign up information is provided on this After Visit Summary.  MyChart is used to connect with patients for Virtual Visits (Telemedicine).  Patients are able to view lab/test results, encounter notes, upcoming appointments, etc.  Non-urgent messages can be sent to your provider as well.   To learn more about what you can do with MyChart, go to [email or web address].

## 2024-03-20 NOTE — Progress Notes (Signed)
 LIPID CLINIC CONSULT NOTE  Chief Complaint:  Manage dyslipidemia  Primary Care Physician: Rebecca [name]  Primary Cardiologist:  None  HPI:  Rebecca [name] is a 68 y.o. female who is being seen today for the evaluation of dyslipidemia at the request of Rebecca [name].  This is a pleasant 68 year old female kindly referred for evaluation and management of dyslipidemia.  She had a recent lipid profile which shows significantly elevated cholesterol including total of 304, triglycerides 267, HDL 63 and LDL 190.  She was advised to take statins in the past and reports being prescribed them although she said she never took them because she worked in social work and she remembers a lot of people that complained about side effects on statins.  Somehow she was eventually prescribed a PCSK9 inhibitor and did take 1 dose of Repatha .  She felt like she might have some knee pain with that and or runny nose for a day or 2 and decided not to take any further doses.  These recent lipid numbers are off of therapy.  She does have risk of heart disease in the family.  She reports her diet is variable and in the past she has been able to clean it up and lose more weight.  12/02/2023  Ms. Rebecca [name] returns today for follow-up.  We have previously discussed restarting Repatha  however unfortunately she did not do that.  She has had a lot of other things going on.  She also has not had recent lab work.  Previously though her labs in June showed an LDL of 190 with triglycerides 267 and total cholesterol 304.  She understands a significant elevation of this the rationale for using Repatha .  She also has filled her prescription and has some medication on hand at home.  We did do a recent calcium  score which was low at 1.5 however this represented 63rd percentile for age and sex matched controls.  The radiology over read suggested underlying interstitial lung disease.  Pulmonary referral was  recommended however has not been scheduled.  03/20/2024  Ms. Rebecca [name] is seen today in follow-up.  She has been on Repatha .  Overall she seems to be tolerating it well.  Unfortunately she did not get her lipids reassessed prior to this visit.  Will need to reassess her cholesterol to see how she is responding to therapy.  She did see Dr. Bertrum [name] for evaluation of possible interstitial lung disease based on the CT exam.  He felt that perhaps this was related to her recent pulmonary infection and a high-resolution CT scan is pending next month.  PMHx:  Past Medical History:  Diagnosis Date   Acid reflux    Anxiety    High blood pressure    Hyperlipidemia    Neck pain    Obesity    Peritonsillar abscess 02/03/2017   PTSD (post-traumatic stress disorder)    Sleep deprivation    Thyroid  disease    monitoring a nodule 1/9    Past Surgical History:  Procedure Laterality Date   BREAST LUMPECTOMY     LAPAROSCOPIC TOTAL HYSTERECTOMY  2012    FAMHx:  Family History  Problem Relation Age of Onset   Cancer Mother        pancreatic cancer   Diabetes Mother    Heart disease Mother        rheumatic heart disease   Hyperlipidemia Mother    High blood pressure Mother    Cancer Father  renal, bone   Diabetes Father    Hyperlipidemia Father    High blood pressure Father    COPD Sister    Sleep apnea Sister    Lung cancer Sister        asbestosis caused lung CA   Lung cancer Brother        smoked   Kidney disease Brother    Heart attack Maternal Grandfather    Colon cancer Maternal Aunt     SOCHx:   reports that she has never smoked. She has been exposed to tobacco smoke. She has never used smokeless tobacco. She reports current alcohol use of about 5.0 - 7.0 standard drinks of alcohol per week. She reports that she does not use drugs.  ALLERGIES:  Allergies  Allergen Reactions   Prednisolone Other (See Comments)    Patient can't remember  Other reaction(s): Delusions  (intolerance) Patient can't remember    Prednisone     Other reaction(s): BAD DREAMS/HALLUCINATIONS  Other Reaction(s): BAD DREAMS/HALLUCINATIONS    ROS: Pertinent items noted in HPI and remainder of comprehensive ROS otherwise negative.  HOME MEDS: Current Outpatient Medications on File Prior to Visit  Medication Sig Dispense Refill   B Complex Vitamins (B-COMPLEX/B-12 PO) Take by mouth.     cholecalciferol (VITAMIN D ) 1000 units tablet Take 5,000 Units by mouth daily.     Evolocumab  (REPATHA  SURECLICK) 140 MG/ML SOAJ Inject 140 mg into the skin every 14 (fourteen) days. 2 mL 3   Omega-3 Fatty Acids (FISH OIL) 1000 MG CAPS Take by mouth.     pantoprazole  (PROTONIX ) 40 MG tablet TAKE 1 TABLET(40 MG) BY MOUTH DAILY 30 tablet 3   topiramate  (TOPAMAX ) 25 MG tablet Take 1 tablet (25 mg total) by mouth daily with supper. 30 tablet 0   traZODone  (DESYREL ) 50 MG tablet Take 0.5-1 tablets (25-50 mg total) by mouth at bedtime as needed for sleep. 90 tablet 3   [DISCONTINUED] escitalopram  (LEXAPRO ) 10 MG tablet Take 1 tablet (10 mg total) by mouth daily. (Patient not taking: Reported on 08/19/2018) 30 tablet 0   No current facility-administered medications on file prior to visit.    LABS/IMAGING: No results found for this or any previous visit (from the past 48 hours). No results found.  LIPID PANEL:    Component Value Date/Time   CHOL 304 (H) 04/29/2023 1344   TRIG 267 (H) 04/29/2023 1344   HDL 63 04/29/2023 1344   CHOLHDL 4.8 (H) 04/29/2023 1344   CHOLHDL 3.8 05/09/2017 0902   VLDL 28 05/09/2017 0902   LDLCALC 190 (H) 04/29/2023 1344    WEIGHTS: Wt Readings from Last 3 Encounters:  03/20/24 216 lb (98 kg)  01/19/24 219 lb (99.3 kg)  12/02/23 215 lb (97.5 kg)    VITALS: BP (!) 152/90 (Cuff Size: Large)   Pulse 84   Ht 5\' 3"  (1.6 m)   Wt 216 lb (98 kg)   SpO2 91%   BMI 38.26 kg/m   EXAM: Deferred  EKG: Deferred  ASSESSMENT: Possible familial hyperlipidemia or  familial combined hyperlipidemia, LDL greater than 190 High triglycerides Elevated LP(a) at 79.8 nmol/L Statin averse Family history of cardiovascular disease Imaging evidence of interstitial pulmonary fibrosis  PLAN: 1.   Ms. Bolash seems to be doing well and is tolerating Repatha .  She has not had repeat lipids.  I we will send her today to see if she can get some labs drawn.  I am interested to see if she has had a  substantial improvement in her LDL.  Plan follow-up with us  otherwise annually or sooner if necessary.  Hazle Lites, MD, [facility], FNLA, FACP  [place]  [facility]  Medical Director of the [facility] Lipid Disorders &  Cardiovascular Risk Reduction Clinic Diplomate of the American Board of Clinical Lipidology Attending Cardiologist  Direct Dial: [phone]  Fax: [phone]  Website:  [email or web address] [email or web address] 03/20/2024, 4:01 PM

## 2024-03-21 ENCOUNTER — Encounter: Payer: Self-pay | Admitting: Nurse Practitioner

## 2024-03-21 ENCOUNTER — Ambulatory Visit (INDEPENDENT_AMBULATORY_CARE_PROVIDER_SITE_OTHER): Admitting: Nurse Practitioner

## 2024-03-21 VITALS — BP 136/66 | HR 70 | Wt 216.0 lb

## 2024-03-21 DIAGNOSIS — F509 Eating disorder, unspecified: Secondary | ICD-10-CM | POA: Insufficient documentation

## 2024-03-21 DIAGNOSIS — H538 Other visual disturbances: Secondary | ICD-10-CM | POA: Diagnosis not present

## 2024-03-21 DIAGNOSIS — I10 Essential (primary) hypertension: Secondary | ICD-10-CM | POA: Diagnosis not present

## 2024-03-21 DIAGNOSIS — E669 Obesity, unspecified: Secondary | ICD-10-CM | POA: Diagnosis not present

## 2024-03-21 DIAGNOSIS — Z1211 Encounter for screening for malignant neoplasm of colon: Secondary | ICD-10-CM | POA: Diagnosis not present

## 2024-03-21 DIAGNOSIS — E7800 Pure hypercholesterolemia, unspecified: Secondary | ICD-10-CM

## 2024-03-21 DIAGNOSIS — R9389 Abnormal findings on diagnostic imaging of other specified body structures: Secondary | ICD-10-CM

## 2024-03-21 DIAGNOSIS — R7303 Prediabetes: Secondary | ICD-10-CM

## 2024-03-21 LAB — NMR, LIPOPROFILE
Cholesterol, Total: 226 mg/dL — ABNORMAL HIGH (ref 100–199)
HDL Particle Number: 49.9 umol/L (ref >=30.5)
HDL-C: 75 mg/dL (ref >39)
LDL Particle Number: 1485 nmol/L — ABNORMAL HIGH (ref <1000)
LDL Size: 20.4 nm — ABNORMAL LOW (ref >20.5)
LDL-C (NIH Calc): 107 mg/dL — ABNORMAL HIGH (ref 0–99)
LP-IR Score: 57 — ABNORMAL HIGH (ref <=45)
Small LDL Particle Number: 715 nmol/L — ABNORMAL HIGH (ref <=527)
Triglycerides: 264 mg/dL — ABNORMAL HIGH (ref 0–149)

## 2024-03-21 LAB — POCT GLYCOSYLATED HEMOGLOBIN (HGB A1C): Hemoglobin A1C: 5.5 % (ref 4.0–5.6)

## 2024-03-21 MED ORDER — AMLODIPINE BESYLATE 5 MG PO TABS: 5.0000 mg | ORAL_TABLET | Freq: Every day | ORAL | 1 refills | Status: DC

## 2024-03-21 MED ORDER — PHENTERMINE HCL 15 MG PO CAPS: 15.0000 mg | ORAL_CAPSULE | ORAL | 0 refills | Status: DC

## 2024-03-21 MED ORDER — SEMAGLUTIDE(0.25 OR 0.5MG/DOS) 2 MG/3ML ~~LOC~~ SOPN: 0.2500 mg | PEN_INJECTOR | SUBCUTANEOUS | 0 refills | Status: DC

## 2024-03-21 NOTE — Assessment & Plan Note (Addendum)
 Wt Readings from Last 3 Encounters:  03/21/24 216 lb (98 kg)  03/20/24 216 lb (98 kg)  01/19/24 219 lb (99.3 kg)   Body mass index is 38.26 kg/m.  Start phentermine 15 mg daily If well-tolerated will increase to 37.5 mg after 4 weeks I discussed with the patient that phentermine is indicated for short-term use only Patient counseled on high-protein low-carb diet Encouraged to engage in regular moderate exercises at least 150 minutes weekly as tolerated Follow-up in 4 weeks Encouraged to report blood pressure readings consistently greater than 140/90, palpitations

## 2024-03-21 NOTE — Assessment & Plan Note (Signed)
 CT cardiac scoring was suggestive of underlying interstitial lung disease. Saw pulmonology, they felt that abnormal CT scan was related to her recent pulmonary infection and they planned on doing a high-resolution CT scan next month. She currently denies cough, wheezing, shortness of breath

## 2024-03-21 NOTE — Progress Notes (Signed)
 Established Patient Office Visit  Subjective:  Patient ID: Rebecca [name], female    DOB: 1956/01/04  Age: 68 y.o. MRN: [id number]  CC:  Chief Complaint  Patient presents with   Hyperlipidemia   Hypertension   Medical Management of Chronic Issues    HPI [name] is a 68 y.o. female  has a past medical history of Acid reflux, Anxiety, High blood pressure, Hyperlipidemia, Neck pain, Obesity, Peritonsillar abscess (02/03/2017), PTSD (post-traumatic stress disorder), Sleep deprivation, and Thyroid  disease.  Patient presents for follow-up for her chronic medical conditions  Hypertension.  Currently on amlodipine  5 mg daily.  Patient denies chest pain shortness of breath edema  Hyperlipidemia.  Currently on Repatha  140 mg every 2 weeks, followed by cardiology  Obesity.  He was going to the weight management clinic but she has stopped.  States that she follows a diet plan provided by her personal trainer, was going for exercises regularly but due to some family commitments she has not been keeping up with her exercises.  Not taking Topamax  that was prescribed by the weight management specialist.  Stated that she has not taking phentermine in the past like to get a prescription for the medication since Medicaid does not cover a GLP-1 for obesity  Need for Tdap vaccine, shingles vaccine and pneumococcal vaccine discussed patient encouraged to get the vaccines at the pharmacy.  Referral for colon cancer screening placed   Past Medical History:  Diagnosis Date   Acid reflux    Anxiety    High blood pressure    Hyperlipidemia    Neck pain    Obesity    Peritonsillar abscess 02/03/2017   PTSD (post-traumatic stress disorder)    Sleep deprivation    Thyroid  disease    monitoring a nodule 1/9    Past Surgical History:  Procedure Laterality Date   BREAST LUMPECTOMY     LAPAROSCOPIC TOTAL HYSTERECTOMY  2012    Family History  Problem Relation Age of Onset   Cancer Mother         pancreatic cancer   Diabetes Mother    Heart disease Mother        rheumatic heart disease   Hyperlipidemia Mother    High blood pressure Mother    Cancer Father        renal, bone   Diabetes Father    Hyperlipidemia Father    High blood pressure Father    COPD Sister    Sleep apnea Sister    Lung cancer Sister        asbestosis caused lung CA   Lung cancer Brother        smoked   Kidney disease Brother    Heart attack Maternal Grandfather    Colon cancer Maternal Aunt     Social History   Socioeconomic History   Marital status: Single    Spouse name: N/A   Number of children: 0   Years of education: 18.5   Highest education level: Manufacturing engineer (e.g., MA, MS, MEng, MEd, MSW, MBA)  Occupational History   Occupation: Retired    Associate Professor: Advice worker    Comment: [employer] x 30 years   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   Retired after 30 years, and took another job (commutes to [place] 4 days/week).   Social Drivers of Corporate investment banker Strain: Low Risk  (03/19/2024)   Overall Financial Resource Strain (CARDIA)    Difficulty of Paying Living Expenses: Not hard at all  Food Insecurity: No Food Insecurity (03/19/2024)   Hunger Vital Sign    Worried About Running Out of Food in the Last Year: Never true    Ran Out of Food in the Last Year: Never true  Transportation Needs: No  Transportation Needs (03/19/2024)   PRAPARE - Administrator, Civil Service (Medical): No    Lack of Transportation (Non-Medical): No  Physical Activity: Sufficiently Active (03/19/2024)   Exercise Vital Sign    Days of Exercise per Week: 4 days    Minutes of Exercise per Session: 60 min  Stress: No Stress Concern Present (03/19/2024)   [employer] of Occupational Health - Occupational Stress Questionnaire    Feeling of Stress : Only a little  Social Connections: Moderately Integrated (03/19/2024)   Social Connection and Isolation Panel [NHANES]    Frequency of Communication with Friends and Family: More than three times a week    Frequency of Social Gatherings with Friends and Family: Once a week    Attends Religious Services: 1 to 4 times per year    Active Member of [employer] or Organizations: Yes    Attends Engineer, structural: More than 4 times per year    Marital Status: Never married  Intimate Partner Violence: Not At Risk (12/23/2022)   Humiliation, Afraid, Rape, and Kick questionnaire    Fear of Current or Ex-Partner: No    Emotionally Abused: No    Physically Abused: No    Sexually Abused: No    Outpatient Medications Prior to Visit  Medication Sig Dispense Refill   B Complex Vitamins (B-COMPLEX/B-12 PO) Take by mouth.     cholecalciferol (VITAMIN D ) 1000 units tablet Take 5,000 Units by mouth daily.     Evolocumab  (REPATHA  SURECLICK) 140 MG/ML SOAJ Inject 140 mg into the skin every 14 (fourteen) days. 2 mL 3   Omega-3 Fatty Acids (FISH OIL) 1000 MG CAPS Take by mouth.     pantoprazole  (PROTONIX ) 40 MG tablet TAKE 1 TABLET(40 MG) BY MOUTH DAILY 30 tablet 3   traZODone  (DESYREL ) 50 MG tablet Take 0.5-1 tablets (25-50 mg total) by mouth at bedtime as needed for sleep. 90 tablet 3   topiramate  (TOPAMAX ) 25 MG tablet Take 1 tablet (25 mg total) by mouth daily with supper. (Patient not taking: Reported on 03/21/2024) 30 tablet 0   No facility-administered  medications prior to visit.    Allergies  Allergen Reactions   Prednisolone Other (See Comments)    Patient can't remember  Other reaction(s): Delusions (intolerance) Patient can't remember    Prednisone     Other reaction(s): BAD DREAMS/HALLUCINATIONS  Other Reaction(s): BAD DREAMS/HALLUCINATIONS    ROS Review of Systems  Constitutional:  Negative for appetite change, chills, fatigue and fever.  HENT:  Negative for congestion, postnasal drip, rhinorrhea  and sneezing.   Respiratory:  Negative for cough, shortness of breath and wheezing.   Cardiovascular:  Negative for chest pain, palpitations and leg swelling.  Gastrointestinal:  Negative for abdominal pain, constipation, nausea and vomiting.  Genitourinary:  Negative for difficulty urinating, dysuria, flank pain and frequency.  Musculoskeletal:  Negative for arthralgias, back pain, joint swelling and myalgias.  Skin:  Negative for color change, pallor, rash and wound.  Neurological:  Negative for dizziness, facial asymmetry, weakness, numbness and headaches.  Psychiatric/Behavioral:  Negative for behavioral problems, confusion, self-injury and suicidal ideas.       Objective:     Physical Exam Vitals and nursing note reviewed.  Constitutional:      General: She is not in acute distress.    Appearance: Normal appearance. She is obese. She is not ill-appearing, toxic-appearing or diaphoretic.  Eyes:     General: No scleral icterus.       Right eye: No discharge.        Left eye: No discharge.     Extraocular Movements: Extraocular movements intact.     Conjunctiva/sclera: Conjunctivae normal.  Cardiovascular:     Rate and Rhythm: Normal rate and regular rhythm.     Pulses: Normal pulses.     Heart sounds: Normal heart sounds. No murmur heard.    No friction rub. No gallop.  Pulmonary:     Effort: Pulmonary effort is normal. No respiratory distress.     Breath sounds: Normal breath sounds. No stridor. No wheezing,  rhonchi or rales.  Chest:     Chest wall: No tenderness.  Abdominal:     General: There is no distension.     Palpations: Abdomen is soft.     Tenderness: There is no abdominal tenderness. There is no right CVA tenderness, left CVA tenderness or guarding.  Musculoskeletal:        General: No swelling, tenderness, deformity or signs of injury.     Right lower leg: No edema.     Left lower leg: No edema.  Skin:    General: Skin is warm and dry.     Capillary Refill: Capillary refill takes 2 to 3 seconds.     Coloration: Skin is not jaundiced or pale.     Findings: No bruising, erythema or lesion.  Neurological:     Mental Status: She is alert and oriented to person, place, and time.     Motor: No weakness.     Coordination: Coordination normal.     Gait: Gait normal.  Psychiatric:        Mood and Affect: Mood normal.        Behavior: Behavior normal.        Thought Content: Thought content normal.        Judgment: Judgment normal.     BP 136/66   Pulse 70   Wt 216 lb (98 kg)   SpO2 95%   BMI 38.26 kg/m  Wt Readings from Last 3 Encounters:  03/21/24 216 lb (98 kg)  03/20/24 216 lb (98 kg)  01/19/24 219 lb (99.3 kg)    Lab Results  Component Value Date   TSH 1.430 10/15/2022   Lab Results  Component Value Date   WBC 9.4 10/15/2022   HGB 13.0 10/15/2022   HCT 37.5 10/15/2022   MCV 91 10/15/2022   PLT 229 10/15/2022   Lab Results  Component Value Date   NA 141 08/29/2023   K 3.9 08/29/2023   CO2 24 08/29/2023  GLUCOSE 84 08/29/2023   BUN 11 08/29/2023   CREATININE 0.61 08/29/2023   BILITOT 0.4 08/29/2023   ALKPHOS 111 08/29/2023   AST 50 (H) 08/29/2023   ALT 17 08/29/2023   PROT 7.8 08/29/2023   ALBUMIN 4.7 08/29/2023   CALCIUM  9.7 08/29/2023   EGFR 99 08/29/2023   Lab Results  Component Value Date   CHOL 304 (H) 04/29/2023   Lab Results  Component Value Date   HDL 63 04/29/2023   Lab Results  Component Value Date   LDLCALC 190 (H) 04/29/2023    Lab Results  Component Value Date   TRIG 267 (H) 04/29/2023   Lab Results  Component Value Date   CHOLHDL 4.8 (H) 04/29/2023   Lab Results  Component Value Date   HGBA1C 5.5 03/21/2024      Assessment & Plan:   Problem List Items Addressed This Visit       Cardiovascular and Mediastinum   Essential hypertension   Controlled on amlodipine  5 mg daily Continue current medication DASH diet and commitment to daily physical activity for a minimum of 30 minutes discussed and encouraged, as a part of hypertension management. The importance of attaining a healthy weight is also discussed.     03/21/2024    3:12 PM 03/20/2024    3:54 PM 01/19/2024    9:12 AM 12/02/2023    1:29 PM 08/29/2023    2:03 PM 08/29/2023    1:15 PM 08/29/2023    1:06 PM  BP/Weight  Systolic BP 136 152 147 132 142 147 142  Diastolic BP 66 90 87 70 54 64 65  Wt. (Lbs) 216 216 219 215   214.6  BMI 38.26 kg/m2 38.26 kg/m2 38.79 kg/m2 38.09 kg/m2   38.01 kg/m2           Relevant Medications   amLODipine  (NORVASC ) 5 MG tablet   Other Relevant Orders   Basic Metabolic Panel     Other   Hypercholesterolemia with LDL greater than 190 mg/dL   Lab Results  Component Value Date   CHOL 304 (H) 04/29/2023   HDL 63 04/29/2023   LDLCALC 190 (H) 04/29/2023   TRIG 267 (H) 04/29/2023   CHOLHDL 4.8 (H) 04/29/2023  Followed by Dr. Maximo Spar Continue Repatha  140 mg every 2 weeks Stated that she had labs done at his cardiologist office yesterday Avoid fatty fried foods, lose weight      Relevant Medications   amLODipine  (NORVASC ) 5 MG tablet   Prediabetes   Lab Results  Component Value Date   HGBA1C 5.5 03/21/2024         Relevant Orders   POCT glycosylated hemoglobin (Hb A1C) (Completed)   Generalized obesity- Start BMI 38.97 - Primary   Wt Readings from Last 3 Encounters:  03/21/24 216 lb (98 kg)  03/20/24 216 lb (98 kg)  01/19/24 219 lb (99.3 kg)   Body mass index is 38.26 kg/m.  Start  phentermine 15 mg daily If well-tolerated will increase to 37.5 mg after 4 weeks I discussed with the patient that phentermine is indicated for short-term use only Patient counseled on high-protein low-carb diet Encouraged to engage in regular moderate exercises at least 150 minutes weekly as tolerated Follow-up in 4 weeks Encouraged to report blood pressure readings consistently greater than 140/90, palpitations      Relevant Medications   phentermine 15 MG capsule   Other Relevant Orders   POCT glycosylated hemoglobin (Hb A1C) (Completed)   Blurry vision, bilateral  Patient complains of blurry vision Referral to ophthalmology placed      Relevant Orders   Ambulatory referral to Ophthalmology   Abnormal CT of the chest   CT cardiac scoring was suggestive of underlying interstitial lung disease. Saw pulmonology, they felt that abnormal CT scan was related to her recent pulmonary infection and they planned on doing a high-resolution CT scan next month. She currently denies cough, wheezing, shortness of breath         Other Visit Diagnoses       Screening for colon cancer       Relevant Orders   Ambulatory referral to Gastroenterology       Meds ordered this encounter  Medications   amLODipine  (NORVASC ) 5 MG tablet    Sig: Take 1 tablet (5 mg total) by mouth daily.    Dispense:  90 tablet    Refill:  1   DISCONTD: Semaglutide,0.25 or 0.5MG /DOS, 2 MG/3ML SOPN    Sig: Inject 0.25 mg into the skin once a week.    Dispense:  3 mL    Refill:  0   phentermine 15 MG capsule    Sig: Take 1 capsule (15 mg total) by mouth every morning.    Dispense:  30 capsule    Refill:  0    Follow-up: Return in about 4 weeks (around 04/18/2024) for obesity.    Jakarri Lesko R Adelin Ventrella, FNP

## 2024-03-21 NOTE — Assessment & Plan Note (Signed)
 Lab Results  Component Value Date   CHOL 304 (H) 04/29/2023   HDL 63 04/29/2023   LDLCALC 190 (H) 04/29/2023   TRIG 267 (H) 04/29/2023   CHOLHDL 4.8 (H) 04/29/2023  Followed by Dr. Maximo Spar Continue Repatha  140 mg every 2 weeks Stated that she had labs done at his cardiologist office yesterday Avoid fatty fried foods, lose weight

## 2024-03-21 NOTE — Assessment & Plan Note (Signed)
 Lab Results  Component Value Date   HGBA1C 5.5 03/21/2024

## 2024-03-21 NOTE — Patient Instructions (Signed)
 Please consider getting Shingrix, pneumococcal  and Tdap vaccine at local pharmacy.   . Generalized obesity- Start BMI 38.97 (Primary)  - phentermine 15 MG capsule; Take 1 capsule (15 mg total) by mouth every morning.  Dispense: 30 capsule; Refill: 0  . Essential hypertension  - Basic Metabolic Panel - amLODipine  (NORVASC ) 5 MG tablet; Take 1 tablet (5 mg total) by mouth daily.  Dispense: 90 tablet; Refill: 1   Screening for colon cancer  - Ambulatory referral to Gastroenterology  . Blurry vision, bilateral  - Ambulatory referral to Ophthalmology    It is important that you exercise regularly at least 30 minutes 5 times a week as tolerated  Think about what you will eat, plan ahead. Choose " clean, green, fresh or frozen" over canned, processed or packaged foods which are more sugary, salty and fatty. 70 to 75% of food eaten should be vegetables and fruit. Three meals at set times with snacks allowed between meals, but they must be fruit or vegetables. Aim to eat over a 12 hour period , example 7 am to 7 pm, and STOP after  your last meal of the day. Drink water,generally about 64 ounces per day, no other drink is as healthy. Fruit juice is best enjoyed in a healthy way, by EATING the fruit.  Thanks for choosing [facility] we consider it a privelige to serve you.

## 2024-03-21 NOTE — Assessment & Plan Note (Addendum)
 Patient complains of blurry vision Referral to ophthalmology placed

## 2024-03-21 NOTE — Assessment & Plan Note (Signed)
 Controlled on amlodipine  5 mg daily Continue current medication DASH diet and commitment to daily physical activity for a minimum of 30 minutes discussed and encouraged, as a part of hypertension management. The importance of attaining a healthy weight is also discussed.     03/21/2024    3:12 PM 03/20/2024    3:54 PM 01/19/2024    9:12 AM 12/02/2023    1:29 PM 08/29/2023    2:03 PM 08/29/2023    1:15 PM 08/29/2023    1:06 PM  BP/Weight  Systolic BP 136 152 147 132 142 147 142  Diastolic BP 66 90 87 70 54 64 65  Wt. (Lbs) 216 216 219 215   214.6  BMI 38.26 kg/m2 38.26 kg/m2 38.79 kg/m2 38.09 kg/m2   38.01 kg/m2

## 2024-03-22 ENCOUNTER — Ambulatory Visit: Payer: Self-pay | Admitting: Internal Medicine

## 2024-03-22 LAB — BASIC METABOLIC PANEL WITH GFR
BUN/Creatinine Ratio: 19 (ref 12–28)
BUN: 13 mg/dL (ref 8–27)
CO2: 24 mmol/L (ref 20–29)
Calcium: 9.7 mg/dL (ref 8.7–10.3)
Chloride: 102 mmol/L (ref 96–106)
Creatinine, Ser: 0.67 mg/dL (ref 0.57–1.00)
Glucose: 95 mg/dL (ref 70–99)
Potassium: 4.5 mmol/L (ref 3.5–5.2)
Sodium: 143 mmol/L (ref 134–144)
eGFR: 97 mL/min/{1.73_m2} (ref >59)

## 2024-03-22 MED ORDER — EZETIMIBE 10 MG PO TABS: 10.0000 mg | ORAL_TABLET | Freq: Every day | ORAL | 3 refills | Status: DC

## 2024-03-23 ENCOUNTER — Ambulatory Visit: Payer: Self-pay | Admitting: Nurse Practitioner

## 2024-03-27 ENCOUNTER — Other Ambulatory Visit: Payer: Self-pay

## 2024-03-27 NOTE — Telephone Encounter (Signed)
 Copied from CRM [id number]. Topic: Clinical - Medication Prior Auth >> Mar 27, 2024 10:39 AM [employer] wrote: Reason for CRM: [facility] needed a prior authorization for Evolocumab  (REPATHA  SURECLICK) 140 MG/ML SOAJ sent to patient's insurance.

## 2024-04-06 ENCOUNTER — Inpatient Hospital Stay
Admission: RE | Admit: 2024-04-06 | Discharge: 2024-04-06 | Disposition: A | Discharge status: Home / Self Care | Payer: Medicare Other | Source: Ambulatory Visit | Attending: Nurse Practitioner | Admitting: Nurse Practitioner

## 2024-04-11 ENCOUNTER — Encounter: Payer: Self-pay | Admitting: Gastroenterology

## 2024-04-19 ENCOUNTER — Ambulatory Visit
Admission: RE | Admit: 2024-04-19 | Discharge: 2024-04-19 | Disposition: A | Discharge status: Home / Self Care | Source: Ambulatory Visit | Attending: Internal Medicine | Admitting: Internal Medicine

## 2024-04-19 DIAGNOSIS — K219 Gastro-esophageal reflux disease without esophagitis: Secondary | ICD-10-CM

## 2024-04-19 DIAGNOSIS — R918 Other nonspecific abnormal finding of lung field: Secondary | ICD-10-CM

## 2024-04-19 DIAGNOSIS — R053 Chronic cough: Secondary | ICD-10-CM

## 2024-04-19 DIAGNOSIS — R9389 Abnormal findings on diagnostic imaging of other specified body structures: Secondary | ICD-10-CM

## 2024-04-24 ENCOUNTER — Ambulatory Visit

## 2024-04-24 DIAGNOSIS — R918 Other nonspecific abnormal finding of lung field: Secondary | ICD-10-CM

## 2024-04-24 DIAGNOSIS — R9389 Abnormal findings on diagnostic imaging of other specified body structures: Secondary | ICD-10-CM

## 2024-04-24 DIAGNOSIS — K219 Gastro-esophageal reflux disease without esophagitis: Secondary | ICD-10-CM

## 2024-04-24 DIAGNOSIS — R053 Chronic cough: Secondary | ICD-10-CM

## 2024-04-24 LAB — PULMONARY FUNCTION TEST
DL/VA % pred: 117 %
DL/VA: 4.95 ml/min/mmHg/L
DLCO cor % pred: 98 %
DLCO cor: 18.96 ml/min/mmHg
DLCO unc % pred: 98 %
DLCO unc: 18.96 ml/min/mmHg
FEF 25-75 Post: 3.81 L/s
FEF 25-75 Pre: 2.88 L/s
FEF2575-%Change-Post: 32 %
FEF2575-%Pred-Post: 184 %
FEF2575-%Pred-Pre: 138 %
FEV1-%Change-Post: 6 %
FEV1-%Pred-Post: 80 %
FEV1-%Pred-Pre: 75 %
FEV1-Post: 1.87 L
FEV1-Pre: 1.75 L
FEV1FVC-%Change-Post: 4 %
FEV1FVC-%Pred-Pre: 115 %
FEV6-%Change-Post: 1 %
FEV6-%Pred-Post: 68 %
FEV6-%Pred-Pre: 67 %
FEV6-Post: 2 L
FEV6-Pre: 1.97 L
FEV6FVC-%Pred-Post: 104 %
FEV6FVC-%Pred-Pre: 104 %
FVC-%Change-Post: 1 %
FVC-%Pred-Post: 65 %
FVC-%Pred-Pre: 64 %
FVC-Post: 2 L
FVC-Pre: 1.97 L
Post FEV1/FVC ratio: 93 %
Post FEV6/FVC ratio: 100 %
Pre FEV1/FVC ratio: 89 %
Pre FEV6/FVC Ratio: 100 %
RV % pred: 29 %
RV: 0.59 L
TLC % pred: 59 %
TLC: 2.92 L

## 2024-04-24 NOTE — Progress Notes (Signed)
 Full PFT performed today.

## 2024-04-24 NOTE — Patient Instructions (Signed)
 Full PFT performed today.

## 2024-04-28 ENCOUNTER — Other Ambulatory Visit: Payer: Self-pay | Admitting: Nurse Practitioner

## 2024-04-28 DIAGNOSIS — E782 Mixed hyperlipidemia: Secondary | ICD-10-CM

## 2024-04-30 ENCOUNTER — Ambulatory Visit (INDEPENDENT_AMBULATORY_CARE_PROVIDER_SITE_OTHER): Payer: Self-pay | Admitting: Nurse Practitioner

## 2024-04-30 ENCOUNTER — Encounter: Payer: Self-pay | Admitting: Nurse Practitioner

## 2024-04-30 VITALS — BP 179/70 | HR 63 | Temp 97.9°F | Wt 220.4 lb

## 2024-04-30 DIAGNOSIS — E66812 Obesity, class 2: Secondary | ICD-10-CM

## 2024-04-30 DIAGNOSIS — E7800 Pure hypercholesterolemia, unspecified: Secondary | ICD-10-CM | POA: Diagnosis not present

## 2024-04-30 DIAGNOSIS — I10 Essential (primary) hypertension: Secondary | ICD-10-CM | POA: Diagnosis not present

## 2024-04-30 DIAGNOSIS — E042 Nontoxic multinodular goiter: Secondary | ICD-10-CM | POA: Diagnosis not present

## 2024-04-30 DIAGNOSIS — Z6839 Body mass index (BMI) 39.0-39.9, adult: Secondary | ICD-10-CM

## 2024-04-30 NOTE — Assessment & Plan Note (Signed)
 Recent CT chest shows low-attenuation thyroid  nodules. Most recent thyroid  ultrasound 01/08/2014 at which time continued ultrasound surveillance was recommended. Recommend thyroid  ultrasound  Follow-up thyroid  ultrasound ordered

## 2024-04-30 NOTE — Assessment & Plan Note (Signed)
 Wt Readings from Last 3 Encounters:  04/30/24 220 lb 6.4 oz (100 kg)  03/21/24 216 lb (98 kg)  03/20/24 216 lb (98 kg)   Body mass index is 39.04 kg/m.  Phentermine  discontinued due to high blood pressure Patient counseled on low-carb diet Encouraged to engage in regular moderate to vigorous exercise at least at least  150 minutes weekly as tolerated

## 2024-04-30 NOTE — Progress Notes (Signed)
 Established Patient Office Visit  Subjective:  Patient ID: Rebecca [name], female    DOB: 07/07/1956  Age: 68 y.o. MRN: [id number]  CC:  Chief Complaint  Patient presents with   Medical Management of Chronic Issues    Weight management     HPI Rebecca [name] is a 68 y.o. female  has a past medical history of Acid reflux, Anxiety, High blood pressure, Hyperlipidemia, Neck pain, Obesity, Peritonsillar abscess (02/03/2017), PTSD (post-traumatic stress disorder), Sleep deprivation, and Thyroid  disease.  Patient presents for follow-up for obesity   Obesity.  Patient was started on phentermine  30 mg daily at her last visit, stated that she has stopped taking the medication about 2 weeks ago.  She has been following a high protein diet not exercising much because of a project that she was doing, she has not finished a project and plans to start exercising again, has a Systems analyst.  Her blood pressure.  Stated that she has stopped taking amlodipine  since about 2 weeks ago because she thought her blood pressure was normal.  Currently denies chest pain shortness of breath edema  Hyperlipidemia.  On Zetia  10 mg daily, Lipitor 40 mg every 2 weeks.  She is followed by cardiology   Has upcoming colonoscopy and eye exam.  Encouraged to get Tdap vaccine, shingles vaccine and pneumococcal vaccine at the pharmacy.  Encouraged to get DEXA scan and mammogram completed       Past Medical History:  Diagnosis Date   Acid reflux    Anxiety    High blood pressure    Hyperlipidemia    Neck pain    Obesity    Peritonsillar abscess 02/03/2017   PTSD (post-traumatic stress disorder)    Sleep deprivation    Thyroid  disease    monitoring a nodule 1/9    Past Surgical History:  Procedure Laterality Date   BREAST LUMPECTOMY     LAPAROSCOPIC TOTAL HYSTERECTOMY  2012    Family History  Problem Relation Age of Onset   Cancer Mother        pancreatic cancer   Diabetes Mother    Heart  disease Mother        rheumatic heart disease   Hyperlipidemia Mother    High blood pressure Mother    Cancer Father        renal, bone   Diabetes Father    Hyperlipidemia Father    High blood pressure Father    COPD Sister    Sleep apnea Sister    Lung cancer Sister        asbestosis caused lung CA   Lung cancer Brother        smoked   Kidney disease Brother    Heart attack Maternal Grandfather    Colon cancer Maternal Aunt     Social History   Socioeconomic History   Marital status: Single    Spouse name: N/A   Number of children: 0   Years of education: 18.5   Highest education level: Manufacturing engineer (e.g., MA, MS, MEng, MEd, MSW, MBA)  Occupational History   Occupation: Retired    Associate Professor: Advice worker    Comment: [employer] x 30 years   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   Retired after 30 years, and took another job (commutes to [place] 4 days/week).   Social Drivers of Corporate investment banker Strain: Low Risk  (03/19/2024)   Overall Financial Resource Strain (CARDIA)    Difficulty of Paying Living Expenses: Not hard at all  Food Insecurity: No Food Insecurity (03/19/2024)   Hunger Vital Sign    Worried About Running Out of Food in the Last Year: Never true    Ran Out of Food in the Last Year: Never true  Transportation Needs: No Transportation Needs (03/19/2024)   PRAPARE -  Administrator, Civil Service (Medical): No    Lack of Transportation (Non-Medical): No  Physical Activity: Sufficiently Active (03/19/2024)   Exercise Vital Sign    Days of Exercise per Week: 4 days    Minutes of Exercise per Session: 60 min  Stress: No Stress Concern Present (03/19/2024)   [employer] of Occupational Health - Occupational Stress Questionnaire    Feeling of Stress : Only a little  Social Connections: Moderately Integrated (03/19/2024)   Social Connection and Isolation Panel    Frequency of Communication with Friends and Family: More than three times a week    Frequency of Social Gatherings with Friends and Family: Once a week    Attends Religious Services: 1 to 4 times per year    Active Member of [employer] or Organizations: Yes    Attends Engineer, structural: More than 4 times per year    Marital Status: Never married  Intimate Partner Violence: Not At Risk (12/23/2022)   Humiliation, Afraid, Rape, and Kick questionnaire    Fear of Current or Ex-Partner: No    Emotionally Abused: No    Physically Abused: No    Sexually Abused: No    Outpatient Medications Prior to Visit  Medication Sig Dispense Refill   amLODipine  (NORVASC ) 5 MG tablet Take 1 tablet (5 mg total) by mouth daily. 90 tablet 1   B Complex Vitamins (B-COMPLEX/B-12 PO) Take by mouth.     cholecalciferol (VITAMIN D ) 1000 units tablet Take 5,000 Units by mouth daily.     ezetimibe  (ZETIA ) 10 MG tablet Take 1 tablet (10 mg total) by mouth daily. 90 tablet 3   Omega-3 Fatty Acids (FISH OIL) 1000 MG CAPS Take by mouth.     pantoprazole  (PROTONIX ) 40 MG tablet TAKE 1 TABLET(40 MG) BY MOUTH DAILY 30 tablet 3   REPATHA  SURECLICK 140 MG/ML SOAJ ADMINISTER 1 ML UNDER THE SKIN EVERY 14 DAYS 2 mL 3   traZODone  (DESYREL ) 50 MG tablet Take 0.5-1 tablets (25-50 mg total) by mouth at bedtime as needed for sleep. 90 tablet 3   topiramate  (TOPAMAX ) 25 MG tablet Take 1 tablet (25 mg total) by mouth  daily with supper. (Patient not taking: Reported on 04/30/2024) 30 tablet 0   phentermine  15 MG capsule Take 1 capsule (15 mg total) by mouth every morning. (Patient not taking: Reported on 04/30/2024) 30 capsule 0   No facility-administered medications prior to visit.    Allergies  Allergen Reactions  Prednisolone Other (See Comments)    Patient can't remember  Other reaction(s): Delusions (intolerance) Patient can't remember    Prednisone     Other reaction(s): BAD DREAMS/HALLUCINATIONS  Other Reaction(s): BAD DREAMS/HALLUCINATIONS    ROS Review of Systems  Constitutional:  Negative for appetite change, chills, fatigue and fever.  HENT:  Negative for congestion, postnasal drip, rhinorrhea and sneezing.   Respiratory:  Negative for cough, shortness of breath and wheezing.   Cardiovascular:  Negative for chest pain, palpitations and leg swelling.  Gastrointestinal:  Negative for abdominal pain, constipation, nausea and vomiting.  Genitourinary:  Negative for difficulty urinating, dysuria, flank pain and frequency.  Musculoskeletal:  Negative for arthralgias, back pain, joint swelling and myalgias.  Skin:  Negative for color change, pallor, rash and wound.  Neurological:  Negative for dizziness, facial asymmetry, weakness, numbness and headaches.  Psychiatric/Behavioral:  Negative for behavioral problems, confusion, self-injury and suicidal ideas.       Objective:    Physical Exam Vitals and nursing note reviewed.  Constitutional:      General: She is not in acute distress.    Appearance: Normal appearance. She is obese. She is not ill-appearing, toxic-appearing or diaphoretic.   Eyes:     General: No scleral icterus.       Right eye: No discharge.        Left eye: No discharge.     Extraocular Movements: Extraocular movements intact.     Conjunctiva/sclera: Conjunctivae normal.    Cardiovascular:     Rate and Rhythm: Normal rate and regular rhythm.     Pulses: Normal  pulses.     Heart sounds: Normal heart sounds. No murmur heard.    No friction rub. No gallop.  Pulmonary:     Effort: Pulmonary effort is normal. No respiratory distress.     Breath sounds: Normal breath sounds. No stridor. No wheezing, rhonchi or rales.  Chest:     Chest wall: No tenderness.  Abdominal:     General: There is no distension.     Palpations: Abdomen is soft.     Tenderness: There is no abdominal tenderness. There is no right CVA tenderness, left CVA tenderness or guarding.   Musculoskeletal:        General: No swelling, tenderness, deformity or signs of injury.     Right lower leg: No edema.     Left lower leg: No edema.   Skin:    General: Skin is warm and dry.     Capillary Refill: Capillary refill takes less than 2 seconds.     Coloration: Skin is not jaundiced or pale.     Findings: No bruising, erythema or lesion.   Neurological:     Mental Status: She is alert and oriented to person, place, and time.     Motor: No weakness.     Gait: Gait normal.   Psychiatric:        Mood and Affect: Mood normal.        Behavior: Behavior normal.        Thought Content: Thought content normal.        Judgment: Judgment normal.     BP (!) 179/70   Pulse 63   Temp 97.9 F (36.6 C) (Oral)   Wt 220 lb 6.4 oz (100 kg)   SpO2 100%   BMI 39.04 kg/m  Wt Readings from Last 3 Encounters:  04/30/24 220 lb 6.4 oz (100 kg)  03/21/24 216 lb (98 kg)  03/20/24  216 lb (98 kg)    Lab Results  Component Value Date   TSH 1.430 10/15/2022   Lab Results  Component Value Date   WBC 9.4 10/15/2022   HGB 13.0 10/15/2022   HCT 37.5 10/15/2022   MCV 91 10/15/2022   PLT 229 10/15/2022   Lab Results  Component Value Date   NA 143 03/21/2024   K 4.5 03/21/2024   CO2 24 03/21/2024   GLUCOSE 95 03/21/2024   BUN 13 03/21/2024   CREATININE 0.67 03/21/2024   BILITOT 0.4 08/29/2023   ALKPHOS 111 08/29/2023   AST 50 (H) 08/29/2023   ALT 17 08/29/2023   PROT 7.8 08/29/2023    ALBUMIN 4.7 08/29/2023   CALCIUM  9.7 03/21/2024   EGFR 97 03/21/2024   Lab Results  Component Value Date   CHOL 304 (H) 04/29/2023   Lab Results  Component Value Date   HDL 63 04/29/2023   Lab Results  Component Value Date   LDLCALC 190 (H) 04/29/2023   Lab Results  Component Value Date   TRIG 267 (H) 04/29/2023   Lab Results  Component Value Date   CHOLHDL 4.8 (H) 04/29/2023   Lab Results  Component Value Date   HGBA1C 5.5 03/21/2024      Assessment & Plan:   Problem List Items Addressed This Visit       Cardiovascular and Mediastinum   Essential hypertension - Primary   Advised to restart amlodipine  5 mg daily Nurse visit in 2 weeks to check blood pressure Follow-up in the office in 6 weeks DASH diet and commitment to daily physical activity for a minimum of 30 minutes discussed and encouraged, as a part of hypertension management. The importance of attaining a healthy weight is also discussed.     04/30/2024    4:01 PM 04/30/2024    3:53 PM 03/21/2024    3:12 PM 03/20/2024    3:54 PM 01/19/2024    9:12 AM 12/02/2023    1:29 PM 08/29/2023    2:03 PM  BP/Weight  Systolic BP 179 162 136 152 147 132 142  Diastolic BP 70 83 66 90 87 70 54  Wt. (Lbs)  220.4 216 216 219 215   BMI  39.04 kg/m2 38.26 kg/m2 38.26 kg/m2 38.79 kg/m2 38.09 kg/m2            Relevant Orders   Recheck vitals     Endocrine   Multiple thyroid  nodules   Recent CT chest shows low-attenuation thyroid  nodules. Most recent thyroid  ultrasound 01/08/2014 at which time continued ultrasound surveillance was recommended. Recommend thyroid  ultrasound  Follow-up thyroid  ultrasound ordered      Relevant Orders   US  THYROID      Other   Hypercholesterolemia with LDL greater than 190 mg/dL   Continue Repatha  140 mg every 2 weeks, Zetia  10 mg daily Avoid fatty fried foods, lose weight Maintain close follow-up with cardiology      Class 2 severe obesity with serious comorbidity and body  mass index (BMI) of 39.0 to 39.9 in adult (HCC)   Wt Readings from Last 3 Encounters:  04/30/24 220 lb 6.4 oz (100 kg)  03/21/24 216 lb (98 kg)  03/20/24 216 lb (98 kg)   Body mass index is 39.04 kg/m.  Phentermine  discontinued due to high blood pressure Patient counseled on low-carb diet Encouraged to engage in regular moderate to vigorous exercise at least at least  150 minutes weekly as tolerated       No orders  of the defined types were placed in this encounter.   Follow-up: Return in about 6 weeks (around 06/11/2024) for CPE.    Latausha Flamm R Allex Lapoint, FNP

## 2024-04-30 NOTE — Assessment & Plan Note (Signed)
 Continue Repatha  140 mg every 2 weeks, Zetia  10 mg daily Avoid fatty fried foods, lose weight Maintain close follow-up with cardiology

## 2024-04-30 NOTE — Patient Instructions (Addendum)
 Please consider getting Shingrix, pneumococcal  and Tdap vaccine at local pharmacy.   Please stop taking phentermine   Restart amlodipine  5 mg daily for your blood pressure.  Please come fasting to your next appointment.  Take your blood pressure medication at least 2 to 3 hours before your appointment      Around 3 times per week, check your blood pressure 2 times per day. once in the morning and once in the evening. The readings should be at least one minute apart. Write down these values and bring them to your next nurse visit/appointment.  When you check your BP, make sure you have been doing something calm/relaxing 5 minutes prior to checking. Both feet should be flat on the floor and you should be sitting. Use your left arm and make sure it is in a relaxed position (on a table), and that the cuff is at the approximate level/height of your heart.    It is important that you exercise regularly at least 30 minutes 5 times a week as tolerated  Think about what you will eat, plan ahead. Choose  clean, green, fresh or frozen over canned, processed or packaged foods which are more sugary, salty and fatty. 70 to 75% of food eaten should be vegetables and fruit. Three meals at set times with snacks allowed between meals, but they must be fruit or vegetables. Aim to eat over a 12 hour period , example 7 am to 7 pm, and STOP after  your last meal of the day. Drink water,generally about 64 ounces per day, no other drink is as healthy. Fruit juice is best enjoyed in a healthy way, by EATING the fruit.  Thanks for choosing [facility] we consider it a privelige to serve you.

## 2024-04-30 NOTE — Assessment & Plan Note (Signed)
 Advised to restart amlodipine  5 mg daily Nurse visit in 2 weeks to check blood pressure Follow-up in the office in 6 weeks DASH diet and commitment to daily physical activity for a minimum of 30 minutes discussed and encouraged, as a part of hypertension management. The importance of attaining a healthy weight is also discussed.     04/30/2024    4:01 PM 04/30/2024    3:53 PM 03/21/2024    3:12 PM 03/20/2024    3:54 PM 01/19/2024    9:12 AM 12/02/2023    1:29 PM 08/29/2023    2:03 PM  BP/Weight  Systolic BP 179 162 136 152 147 132 142  Diastolic BP 70 83 66 90 87 70 54  Wt. (Lbs)  220.4 216 216 219 215   BMI  39.04 kg/m2 38.26 kg/m2 38.26 kg/m2 38.79 kg/m2 38.09 kg/m2

## 2024-05-01 ENCOUNTER — Ambulatory Visit (AMBULATORY_SURGERY_CENTER)

## 2024-05-01 ENCOUNTER — Encounter: Payer: Self-pay | Admitting: Gastroenterology

## 2024-05-01 VITALS — Ht 63.0 in | Wt 220.5 lb

## 2024-05-01 DIAGNOSIS — Z860100 Personal history of colon polyps, unspecified: Secondary | ICD-10-CM

## 2024-05-01 MED ORDER — NA SULFATE-K SULFATE-MG SULF 17.5-3.13-1.6 GM/177ML PO SOLN: 1.0000 | Freq: Once | ORAL | 0 refills | Status: AC

## 2024-05-01 NOTE — Progress Notes (Signed)
 No egg or soy allergy known to patient  No issues known to pt with past sedation with any surgeries or procedures Patient denies ever being told they had issues or difficulty with intubation  No FH of Malignant Hyperthermia Pt is not currently not taking diet pills d/t HBP Pt is not on home 02  Pt is not on blood thinners  Pt denies issues with chronic constipation  No A fib or A flutter Have any cardiac testing pending--no Pt instructed to use Singlecare.[email or web address] or GoodRx for a price reduction on prep  Ambulates independently Chart has been checked by DOROTHA Schillings, CRNA

## 2024-05-03 LAB — HM DIABETES EYE EXAM

## 2024-05-04 ENCOUNTER — Ambulatory Visit
Admission: RE | Admit: 2024-05-04 | Discharge: 2024-05-04 | Disposition: A | Discharge status: Home / Self Care | Source: Ambulatory Visit | Attending: Nurse Practitioner

## 2024-05-04 DIAGNOSIS — E042 Nontoxic multinodular goiter: Secondary | ICD-10-CM

## 2024-05-07 ENCOUNTER — Ambulatory Visit: Payer: Self-pay | Admitting: Nurse Practitioner

## 2024-05-14 ENCOUNTER — Ambulatory Visit (INDEPENDENT_AMBULATORY_CARE_PROVIDER_SITE_OTHER): Payer: Self-pay

## 2024-05-14 VITALS — BP 158/67 | HR 60 | Temp 97.0°F

## 2024-05-14 DIAGNOSIS — I10 Essential (primary) hypertension: Secondary | ICD-10-CM

## 2024-05-14 NOTE — Progress Notes (Signed)
   Blood Pressure Recheck Visit  Name: Rebecca [name] MRN: [id number] Date of Birth: [birth year]  [name] presents today for Blood Pressure recheck with clinical support staff.  Order for BP recheck by Folashade Paseda, FNP, ordered on 03/21/24.   BP Readings from Last 3 Encounters:  04/30/24 (!) 179/70  03/21/24 136/66  03/20/24 (!) 152/90    Current Outpatient Medications  Medication Sig Dispense Refill   amLODipine  (NORVASC ) 5 MG tablet Take 1 tablet (5 mg total) by mouth daily. 90 tablet 1   B Complex Vitamins (B-COMPLEX/B-12 PO) Take by mouth.     cholecalciferol (VITAMIN D ) 1000 units tablet Take 5,000 Units by mouth daily.     ezetimibe  (ZETIA ) 10 MG tablet Take 1 tablet (10 mg total) by mouth daily. 90 tablet 3   Omega-3 Fatty Acids (FISH OIL) 1000 MG CAPS Take by mouth.     pantoprazole  (PROTONIX ) 40 MG tablet TAKE 1 TABLET(40 MG) BY MOUTH DAILY 30 tablet 3   REPATHA  SURECLICK 140 MG/ML SOAJ ADMINISTER 1 ML UNDER THE SKIN EVERY 14 DAYS 2 mL 3   topiramate  (TOPAMAX ) 25 MG tablet Take 1 tablet (25 mg total) by mouth daily with supper. (Patient not taking: Reported on 05/01/2024) 30 tablet 0   traZODone  (DESYREL ) 50 MG tablet Take 0.5-1 tablets (25-50 mg total) by mouth at bedtime as needed for sleep. 90 tablet 3   No current facility-administered medications for this visit.    Hypertensive Medication Review: Patient states that they are taking all their hypertensive medications as prescribed and their last dose of hypertensive medications was this morning   Documentation of any medication adherence discrepancies: none  Provider Recommendation:  Spoke to [employer], and they stated: continue to take b/p medication and keep a log of readings.  Patient has been scheduled to follow up with 06/18/2024   Patient has been given provider's recommendations and does not have any questions or concerns at this time. Patient will contact the office for any future questions  or concerns.

## 2024-05-31 ENCOUNTER — Ambulatory Visit: Admitting: Gastroenterology

## 2024-05-31 ENCOUNTER — Encounter: Payer: Self-pay | Admitting: Gastroenterology

## 2024-05-31 VITALS — BP 148/79 | HR 65 | Temp 98.1°F | Resp 16 | Ht 63.0 in | Wt 220.0 lb

## 2024-05-31 DIAGNOSIS — D123 Benign neoplasm of transverse colon: Secondary | ICD-10-CM | POA: Diagnosis not present

## 2024-05-31 DIAGNOSIS — D122 Benign neoplasm of ascending colon: Secondary | ICD-10-CM

## 2024-05-31 DIAGNOSIS — Z860100 Personal history of colon polyps, unspecified: Secondary | ICD-10-CM

## 2024-05-31 DIAGNOSIS — Z1211 Encounter for screening for malignant neoplasm of colon: Secondary | ICD-10-CM

## 2024-05-31 DIAGNOSIS — D124 Benign neoplasm of descending colon: Secondary | ICD-10-CM

## 2024-05-31 MED ORDER — SODIUM CHLORIDE 0.9 % IV SOLN: 500.0000 mL | Freq: Once | INTRAVENOUS | Status: DC

## 2024-05-31 NOTE — Progress Notes (Signed)
 [facility] History and Physical   Primary Care Physician:  Paseda, Folashade R, FNP   Reason for Procedure:   Colon cancer screening  Plan:    Screening colonoscopy     HPI: Rebecca [name] is a 68 y.o. female undergoing average risk screening colonoscopy.  She has no family history of colon cancer (other than an aunt) and no chronic GI symptoms.  She had a colonoscopy in 2015 in which rectal hyperplastic polyps were removed.   Past Medical History:  Diagnosis Date   Acid reflux    Anxiety    High blood pressure    Hyperlipidemia    Neck pain    Obesity    Peritonsillar abscess 02/03/2017   PTSD (post-traumatic stress disorder)    Sleep apnea    Sleep deprivation    Thyroid  disease    monitoring a nodule 1/9    Past Surgical History:  Procedure Laterality Date   BREAST LUMPECTOMY     COLONOSCOPY     PARTIAL HYSTERECTOMY  2012    Prior to Admission medications   Medication Sig Start Date End Date Taking? Authorizing Provider  amLODipine  (NORVASC ) 5 MG tablet Take 1 tablet (5 mg total) by mouth daily. 03/21/24  Yes Paseda, Folashade R, FNP  B Complex Vitamins (B-COMPLEX/B-12 PO) Take by mouth.   Yes [provider]  cholecalciferol (VITAMIN D ) 1000 units tablet Take 5,000 Units by mouth daily.   Yes [provider]  ezetimibe  (ZETIA ) 10 MG tablet Take 1 tablet (10 mg total) by mouth daily. 03/22/24 06/20/24 Yes Hilty, Vinie BROCKS, MD  Omega-3 Fatty Acids (FISH OIL) 1000 MG CAPS Take by mouth.   Yes [provider]  pantoprazole  (PROTONIX ) 40 MG tablet TAKE 1 TABLET(40 MG) BY MOUTH DAILY 12/28/23  Yes Paseda, Folashade R, FNP  REPATHA  SURECLICK 140 MG/ML SOAJ ADMINISTER 1 ML UNDER THE SKIN EVERY 14 DAYS 04/30/24   Paseda, Folashade R, FNP  topiramate  (TOPAMAX ) 25 MG tablet Take 1 tablet (25 mg total) by mouth daily with supper. Patient not taking: Reported on 04/30/2024 06/06/23   Whitmire, Stephane, FNP  traZODone  (DESYREL ) 50 MG tablet  Take 0.5-1 tablets (25-50 mg total) by mouth at bedtime as needed for sleep. 03/30/23   Rayburn, Elouise Phlegm, PA-C  escitalopram  (LEXAPRO ) 10 MG tablet Take 1 tablet (10 mg total) by mouth daily. Patient not taking: Reported on 08/19/2018 04/19/18 08/19/18  Stroud, Natalie M, FNP    Current Outpatient Medications  Medication Sig Dispense Refill   amLODipine  (NORVASC ) 5 MG tablet Take 1 tablet (5 mg total) by mouth daily. 90 tablet 1   B Complex Vitamins (B-COMPLEX/B-12 PO) Take by mouth.     cholecalciferol (VITAMIN D ) 1000 units tablet Take 5,000 Units by mouth daily.     ezetimibe  (ZETIA ) 10 MG tablet Take 1 tablet (10 mg total) by mouth daily. 90 tablet 3   Omega-3 Fatty Acids (FISH OIL) 1000 MG CAPS Take by mouth.     pantoprazole  (PROTONIX ) 40 MG tablet TAKE 1 TABLET(40 MG) BY MOUTH DAILY 30 tablet 3   REPATHA  SURECLICK 140 MG/ML SOAJ ADMINISTER 1 ML UNDER THE SKIN EVERY 14 DAYS 2 mL 3   topiramate  (TOPAMAX ) 25 MG tablet Take 1 tablet (25 mg total) by mouth daily with supper. (Patient not taking: Reported on 04/30/2024) 30 tablet 0   traZODone  (DESYREL ) 50 MG tablet Take 0.5-1 tablets (25-50 mg total) by mouth at bedtime as needed for sleep. 90 tablet 3   Current  Facility-Administered Medications  Medication Dose Route Frequency Provider Last Rate Last Admin   0.9 %  sodium chloride  infusion  500 mL Intravenous Once Stacia Glendia BRAVO, MD        Allergies as of 05/31/2024 - Review Complete 05/31/2024  Allergen Reaction Noted   Prednisolone Other (See Comments) 09/16/2014   Prednisone Other (See Comments) 07/10/2021    Family History  Problem Relation Age of Onset   Pancreatic cancer Mother    Cancer Mother        pancreatic cancer   Diabetes Mother    Heart disease Mother        rheumatic heart disease   Hyperlipidemia Mother    High blood pressure Mother    Cancer Father        renal, bone   Diabetes Father    Hyperlipidemia Father    High blood pressure Father     COPD Sister    Sleep apnea Sister    Lung cancer Sister        asbestosis caused lung CA   Lung cancer Brother        smoked   Kidney disease Brother    Colon cancer Maternal Aunt    Heart attack Maternal Grandfather    Esophageal cancer Neg Hx    Rectal cancer Neg Hx    Stomach cancer Neg Hx     Social History   Socioeconomic History   Marital status: Single    Spouse name: N/A   Number of children: 0   Years of education: 18.5   Highest education level: Master's degree (e.g., MA, MS, MEng, MEd, MSW, MBA)  Occupational History   Occupation: Retired    Associate Professor: Advice worker    Comment: [employer] x 30 years   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   Retired after 30 years, and took another job (commutes to [place] 4 days/week).   Social Drivers of Corporate investment banker Strain: Low Risk  (03/19/2024)   Overall Financial Resource Strain (CARDIA)    Difficulty of Paying Living Expenses: Not hard at all  Food Insecurity: No Food Insecurity (03/19/2024)   Hunger Vital Sign    Worried About Running Out of Food in the Last Year: Never true    Ran Out of Food in the Last Year: Never true  Transportation Needs: No Transportation Needs (03/19/2024)   PRAPARE - Administrator, Civil Service (Medical): No    Lack of Transportation (Non-Medical): No  Physical Activity: Sufficiently Active  (03/19/2024)   Exercise Vital Sign    Days of Exercise per Week: 4 days    Minutes of Exercise per Session: 60 min  Stress: No Stress Concern Present (03/19/2024)   [employer] of Occupational Health - Occupational Stress Questionnaire    Feeling of  Stress : Only a little  Social Connections: Moderately Integrated (03/19/2024)   Social Connection and Isolation Panel    Frequency of Communication with Friends and Family: More than three times a week    Frequency of Social Gatherings with Friends and Family: Once a week    Attends Religious Services: 1 to 4 times per year    Active Member of [employer] or Organizations: Yes    Attends Engineer, structural: More than 4 times per year    Marital Status: Never married  Intimate Partner Violence: Not At Risk (12/23/2022)   Humiliation, Afraid, Rape, and Kick questionnaire    Fear of Current or Ex-Partner: No    Emotionally Abused: No    Physically Abused: No    Sexually Abused: No    Review of Systems:  All other review of systems negative except as mentioned in the HPI.  Physical Exam: Vital signs BP (!) 173/93   Pulse 71   Temp 98.1 F (36.7 C)   Ht 5' 3 (1.6 m)   Wt 220 lb (99.8 kg)   SpO2 98%   BMI 38.97 kg/m   General:   Alert,  Well-developed, well-nourished, pleasant and cooperative in NAD Airway:  Mallampati 3 Lungs:  Clear throughout to auscultation.   Heart:  Regular rate and rhythm; no murmurs, clicks, rubs,  or gallops. Abdomen:  Soft, nontender and nondistended. Normal bowel sounds.   Neuro/Psych:  Normal mood and affect. A and O x 3   Yuchen Fedor E. Stacia, MD [facility]

## 2024-05-31 NOTE — Progress Notes (Signed)
 Report to PACU, RN, vss, BBS= Clear.

## 2024-05-31 NOTE — Op Note (Signed)
 [facility] Patient Name: Rebecca [name] Procedure Date: 05/31/2024 8:50 AM MRN: [id number] Endoscopist: Glendia E. Stacia , MD, [id number] Age: 68 Referring MD:  Date of Birth: 1956-07-21 Gender: Female Account #: [id number] Procedure:                Colonoscopy Indications:              Screening for colorectal malignant neoplasm (last                            colonoscopy was 10 years ago) Medicines:                Monitored Anesthesia Care Procedure:                Pre-Anesthesia Assessment:                           - Prior to the procedure, a History and Physical                            was performed, and patient medications and                            allergies were reviewed. The patient's tolerance of                            previous anesthesia was also reviewed. The risks                            and benefits of the procedure and the sedation                            options and risks were discussed with the patient.                            All questions were answered, and informed consent                            was obtained. Prior Anticoagulants: The patient has                            taken no anticoagulant or antiplatelet agents. ASA                            Grade Assessment: II - A patient with mild systemic                            disease. After reviewing the risks and benefits,                            the patient was deemed in satisfactory condition to                            undergo the procedure.  After obtaining informed consent, the colonoscope                            was passed under direct vision. Throughout the                            procedure, the patient's blood pressure, pulse, and                            oxygen saturations were monitored continuously. The                            CF [id number] #[id number] was introduced through the anus                            and advanced to  the the terminal ileum, with                            identification of the appendiceal orifice and IC                            valve. The colonoscopy was performed without                            difficulty. The patient tolerated the procedure                            well. The quality of the bowel preparation was                            good. The terminal ileum, ileocecal valve,                            appendiceal orifice, and rectum were photographed.                            The bowel preparation used was SUPREP via split                            dose instruction. Scope In: 9:03:32 AM Scope Out: 9:18:16 AM Scope Withdrawal Time: 0 hours 11 minutes 56 seconds  Total Procedure Duration: 0 hours 14 minutes 44 seconds  Findings:                 The perianal and digital rectal examinations were                            normal. Pertinent negatives include normal                            sphincter tone and no palpable rectal lesions.                           A 2 mm polyp was found in the ascending colon. The  polyp was sessile. The polyp was removed with a                            cold snare. Resection and retrieval were complete.                            Estimated blood loss was minimal.                           A 5 mm polyp was found in the hepatic flexure. The                            polyp was flat. The polyp was removed with a cold                            snare. Resection and retrieval were complete.                            Estimated blood loss was minimal.                           A 3 mm polyp was found in the descending colon. The                            polyp was sessile. The polyp was removed with a                            cold snare. Resection and retrieval were complete.                            Estimated blood loss was minimal.                           The exam was otherwise normal throughout the                             examined colon.                           The terminal ileum appeared normal.                           The retroflexed view of the distal rectum and anal                            verge was normal and showed no anal or rectal                            abnormalities. Complications:            No immediate complications. Estimated Blood Loss:     Estimated blood loss was minimal. Impression:               - One 2 mm polyp in the ascending colon, removed  with a cold snare. Resected and retrieved.                           - One 5 mm polyp at the hepatic flexure, removed                            with a cold snare. Resected and retrieved.                           - One 3 mm polyp in the descending colon, removed                            with a cold snare. Resected and retrieved.                           - The examined portion of the ileum was normal.                           - The distal rectum and anal verge are normal on                            retroflexion view. Recommendation:           - Patient has a contact number available for                            emergencies. The signs and symptoms of potential                            delayed complications were discussed with the                            patient. Return to normal activities tomorrow.                            Written discharge instructions were provided to the                            patient.                           - Resume previous diet.                           - Continue present medications.                           - Await pathology results.                           - Repeat colonoscopy (date not yet determined) for                            surveillance based on pathology results. Ashe Graybeal E. Stacia, MD 05/31/2024 9:23:35 AM This report has been signed electronically.

## 2024-05-31 NOTE — Progress Notes (Signed)
 Called to room to assist during endoscopic procedure.  Patient ID and intended procedure confirmed with present staff. Received instructions for my participation in the procedure from the performing physician.

## 2024-05-31 NOTE — Patient Instructions (Signed)
 Resume previous diet and medications.  Handout provided on polyps.  Biopsy results will be sent via MyChart or letter.    YOU HAD AN ENDOSCOPIC PROCEDURE TODAY AT THE [facility]:   Refer to the procedure report that was given to you for any specific questions about what was found during the examination.  If the procedure report does not answer your questions, please call your gastroenterologist to clarify.  If you requested that your care partner not be given the details of your procedure findings, then the procedure report has been included in a sealed envelope for you to review at your convenience later.  YOU SHOULD EXPECT: Some feelings of bloating in the abdomen. Passage of more gas than usual.  Walking can help get rid of the air that was put into your GI tract during the procedure and reduce the bloating. If you had a lower endoscopy (such as a colonoscopy or flexible sigmoidoscopy) you may notice spotting of blood in your stool or on the toilet paper. If you underwent a bowel prep for your procedure, you may not have a normal bowel movement for a few days.  Please Note:  You might notice some irritation and congestion in your nose or some drainage.  This is from the oxygen used during your procedure.  There is no need for concern and it should clear up in a day or so.  SYMPTOMS TO REPORT IMMEDIATELY:  Following lower endoscopy (colonoscopy or flexible sigmoidoscopy):  Excessive amounts of blood in the stool  Significant tenderness or worsening of abdominal pains  Swelling of the abdomen that is new, acute  Fever of 100F or higher  For urgent or emergent issues, a gastroenterologist can be reached at any hour by calling [phone]. Do not use MyChart messaging for urgent concerns.    DIET:  We do recommend a small meal at first, but then you may proceed to your regular diet.  Drink plenty of fluids but you should avoid alcoholic beverages for 24 hours.  ACTIVITY:  You  should plan to take it easy for the rest of today and you should NOT DRIVE or use heavy machinery until tomorrow (because of the sedation medicines used during the test).    FOLLOW UP: Our staff will call the number listed on your records the next business day following your procedure.  We will call around 7:15- 8:00 am to check on you and address any questions or concerns that you may have regarding the information given to you following your procedure. If we do not reach you, we will leave a message.     If any biopsies were taken you will be contacted by phone or by letter within the next 1-3 weeks.  Please call us  at [phone] if you have not heard about the biopsies in 3 weeks.    SIGNATURES/CONFIDENTIALITY: You and/or your care partner have signed paperwork which will be entered into your electronic medical record.  These signatures attest to the fact that that the information above on your After Visit Summary has been reviewed and is understood.  Full responsibility of the confidentiality of this discharge information lies with you and/or your care-partner.

## 2024-06-01 ENCOUNTER — Telehealth: Payer: Self-pay | Admitting: *Deleted

## 2024-06-01 NOTE — Telephone Encounter (Signed)
  Follow up Call-     05/31/2024    8:27 AM  Call back number  Post procedure Call Back phone  # [phone]  Permission to leave phone message Yes     Patient questions:  Do you have a fever, pain , or abdominal swelling? No. Pain Score  0 *  Have you tolerated food without any problems? Yes.    Have you been able to return to your normal activities? Yes.    Do you have any questions about your discharge instructions: Diet   No. Medications  No. Follow up visit  No.  Do you have questions or concerns about your Care? Yes.    Pt stated she had some congestion and chills.  Unsure if has fever.  Education provided - some congestion is normal from supplemental oxygen but if does not improve, and if fever/chills present, pt to call back.  Verbalized understanding.  Actions: * If pain score is 4 or above: No action needed, pain <4.

## 2024-06-04 LAB — SURGICAL PATHOLOGY

## 2024-06-11 ENCOUNTER — Ambulatory Visit: Payer: Self-pay | Admitting: Gastroenterology

## 2024-06-11 NOTE — Progress Notes (Signed)
 Ms. Rebecca [name], The three polyps that I removed during your recent procedure were completely benign but were proven to be pre-cancerous polyps that MAY have grown into cancers if they had not been removed.  Studies shows that at least 20% of women over age 66 and 30% of men over age 62 have pre-cancerous polyps.  Based on current nationally recognized surveillance guidelines, I recommend that you have a repeat colonoscopy in 5 years.   If you develop any new rectal bleeding, abdominal pain or significant bowel habit changes, please contact me before then.

## 2024-06-18 ENCOUNTER — Encounter: Payer: Self-pay | Admitting: Nurse Practitioner

## 2024-06-18 ENCOUNTER — Ambulatory Visit (INDEPENDENT_AMBULATORY_CARE_PROVIDER_SITE_OTHER): Payer: Self-pay | Admitting: Nurse Practitioner

## 2024-06-18 VITALS — BP 143/62 | HR 65 | Temp 97.3°F | Wt 220.0 lb

## 2024-06-18 DIAGNOSIS — Z0000 Encounter for general adult medical examination without abnormal findings: Secondary | ICD-10-CM | POA: Diagnosis not present

## 2024-06-18 DIAGNOSIS — I10 Essential (primary) hypertension: Secondary | ICD-10-CM

## 2024-06-18 DIAGNOSIS — Z1329 Encounter for screening for other suspected endocrine disorder: Secondary | ICD-10-CM

## 2024-06-18 DIAGNOSIS — E559 Vitamin D deficiency, unspecified: Secondary | ICD-10-CM

## 2024-06-18 DIAGNOSIS — Z1231 Encounter for screening mammogram for malignant neoplasm of breast: Secondary | ICD-10-CM

## 2024-06-18 DIAGNOSIS — Z780 Asymptomatic menopausal state: Secondary | ICD-10-CM | POA: Insufficient documentation

## 2024-06-18 DIAGNOSIS — E7800 Pure hypercholesterolemia, unspecified: Secondary | ICD-10-CM

## 2024-06-18 DIAGNOSIS — Z6839 Body mass index (BMI) 39.0-39.9, adult: Secondary | ICD-10-CM

## 2024-06-18 DIAGNOSIS — K635 Polyp of colon: Secondary | ICD-10-CM | POA: Insufficient documentation

## 2024-06-18 DIAGNOSIS — E66812 Obesity, class 2: Secondary | ICD-10-CM | POA: Diagnosis not present

## 2024-06-18 DIAGNOSIS — K219 Gastro-esophageal reflux disease without esophagitis: Secondary | ICD-10-CM

## 2024-06-18 DIAGNOSIS — Z13228 Encounter for screening for other metabolic disorders: Secondary | ICD-10-CM

## 2024-06-18 DIAGNOSIS — Z1321 Encounter for screening for nutritional disorder: Secondary | ICD-10-CM

## 2024-06-18 DIAGNOSIS — Z130 Encounter for screening for diseases of the blood and blood-forming organs and certain disorders involving the immune mechanism: Secondary | ICD-10-CM

## 2024-06-18 NOTE — Assessment & Plan Note (Signed)
  History of precancerous colonic polyps Three precancerous polyps removed. Advised repeat colonoscopy in five years. Discussed family history and symptom monitoring. - Advise to contact healthcare provider if symptoms such as bleeding or abdominal pain occur.

## 2024-06-18 NOTE — Assessment & Plan Note (Signed)
  Takes pantoprazole  40mg  as needed. Discussed dietary modifications to manage symptoms. - Advise dietary modifications to avoid trigger foods and not eat late at night.

## 2024-06-18 NOTE — Assessment & Plan Note (Signed)
  LDL improved to 107 mg/dL with Repatha . Goal LDL <70 mg/dL. Discussed starting Zetia .  Continue Repatha  40 mg injection every 2 weeks - Encourage starting Zetia  10 mg daily. - Check cholesterol level in two months.

## 2024-06-18 NOTE — Patient Instructions (Signed)
 Please consider getting Shingrix, pneumovax vaccine  and Tdap vaccine at local pharmacy.   Get your mammogram and Dexa scan done as ordered Please call [phone]   to schedule your mammogram.  [facility]. [address] [place], [place] [address]. [place] .    Follow up with the weight management specialist [phone]   It is important that you exercise regularly at least 30 minutes 5 times a week as tolerated  Think about what you will eat, plan ahead. Choose  clean, green, fresh or frozen over canned, processed or packaged foods which are more sugary, salty and fatty. 70 to 75% of food eaten should be vegetables and fruit. Three meals at set times with snacks allowed between meals, but they must be fruit or vegetables. Aim to eat over a 12 hour period , example 7 am to 7 pm, and STOP after  your last meal of the day. Drink water,generally about 64 ounces per day, no other drink is as healthy. Fruit juice is best enjoyed in a healthy way, by EATING the fruit.  Thanks for choosing [facility] we consider it a privelige to serve you.

## 2024-06-18 NOTE — Assessment & Plan Note (Addendum)
 Wt Readings from Last 3 Encounters:  06/18/24 220 lb (99.8 kg)  05/31/24 220 lb (99.8 kg)  05/01/24 220 lb 8 oz (100 kg)   Body mass index is 38.97 kg/m.  Discussed diet and exercise importance. - Encourage meal preparation focusing on vegetables and protein. - Advise moderate to vigorous exercise, 30 minutes five days a week.

## 2024-06-18 NOTE — Progress Notes (Addendum)
 Complete physical exam  Patient: Rebecca [name]   DOB: 07-10-1956   68 y.o. Female  MRN: [id number]  Subjective:    Chief Complaint  Patient presents with   Annual Exam        Discussed the use of AI scribe software for clinical note transcription with the patient, who gave verbal consent to proceed.  History of Present Illness Rebecca [name] is a 68 year old female  has a past medical history of Acid reflux, Anxiety, High blood pressure, Hyperlipidemia, Neck pain, Obesity, Peritonsillar abscess (02/03/2017), PTSD (post-traumatic stress disorder), Sleep apnea, Sleep deprivation, and Thyroid  disease. who presents for a CPE  She is concerned about her blood pressure and is currently taking amlodipine  5 mg, which she took at 8 AM today. She struggles with consistency in managing her weight and cholesterol. She takes vitamin D  5000 IU and has started Repatha  for cholesterol management but has not been taking Zetia  10 mg as prescribed.  She has a history of colonoscopy revealing three benign but precancerous polyps, with a recommendation for a repeat colonoscopy in five years. There is a family history of colon cancer, with a relative being a survivor. She experienced cold-like symptoms after the colonoscopy prep, which resolved over time.  She manages acid reflux with pantoprazole , which she takes as needed. Her diet is general, often skipping meals, and she struggles with meal prep. She drinks a plant-based protein shake for breakfast and occasionally eats eggs and bacon. She reports being active, especially with an upcoming convention that will involve a lot of walking.  No fever, chills, chest pain, shortness of breath, dizziness, nausea, or vomiting. Regular bowel movements. She drinks socially and does not smoke or use drugs.     Assessment & Plan      Most recent fall risk assessment:    01/19/2024    9:06 AM  Fall Risk   Falls in the past year? 0  Number falls in  past yr: 0  Injury with Fall? 0     Most recent depression screenings:    06/18/2024    9:58 AM 03/21/2024    3:15 PM  PHQ 2/9 Scores  PHQ - 2 Score 0 0  PHQ- 9 Score 0         Patient Care Team: Xayden Linsey R, FNP as PCP - General (Nurse Practitioner)   Outpatient Medications Prior to Visit  Medication Sig   amLODipine  (NORVASC ) 5 MG tablet Take 1 tablet (5 mg total) by mouth daily.   B Complex Vitamins (B-COMPLEX/B-12 PO) Take by mouth.   cholecalciferol (VITAMIN D ) 1000 units tablet Take 5,000 Units by mouth daily.   Omega-3 Fatty Acids (FISH OIL) 1000 MG CAPS Take by mouth.   pantoprazole  (PROTONIX ) 40 MG tablet TAKE 1 TABLET(40 MG) BY MOUTH DAILY (Patient taking differently: TAKE 1 TABLET(40 MG) BY MOUTH DAILY)   REPATHA  SURECLICK 140 MG/ML SOAJ ADMINISTER 1 ML UNDER THE SKIN EVERY 14 DAYS   traZODone  (DESYREL ) 50 MG tablet Take 0.5-1 tablets (25-50 mg total) by mouth at bedtime as needed for sleep.   ezetimibe  (ZETIA ) 10 MG tablet Take 1 tablet (10 mg total) by mouth daily. (Patient not taking: Reported on 06/18/2024)   topiramate  (TOPAMAX ) 25 MG tablet Take 1 tablet (25 mg total) by mouth daily with supper. (Patient not taking: Reported on 06/18/2024)   No facility-administered medications prior to visit.    Review of Systems  Constitutional:  Negative for appetite change, chills, fatigue and  fever.  HENT:  Negative for congestion, postnasal drip, rhinorrhea and sneezing.   Eyes:  Negative for pain, discharge and itching.  Respiratory:  Negative for cough, shortness of breath and wheezing.   Cardiovascular:  Negative for chest pain, palpitations and leg swelling.  Gastrointestinal:  Negative for abdominal pain, constipation, nausea and vomiting.  Endocrine: Negative for polydipsia, polyphagia and polyuria.  Genitourinary:  Negative for difficulty urinating, dysuria, flank pain and frequency.  Musculoskeletal:  Negative for arthralgias, back pain, joint swelling  and myalgias.  Skin:  Negative for color change, pallor, rash and wound.  Neurological:  Negative for dizziness, facial asymmetry, weakness, numbness and headaches.  Psychiatric/Behavioral:  Negative for behavioral problems, confusion, self-injury and suicidal ideas.        Objective:     BP (!) 143/62   Pulse 65   Temp (!) 97.3 F (36.3 C)   Wt 220 lb (99.8 kg)   SpO2 99%   BMI 38.97 kg/m    Physical Exam Vitals and nursing note reviewed. Exam conducted with a chaperone present.  Constitutional:      General: She is not in acute distress.    Appearance: Normal appearance. She is obese. She is not ill-appearing, toxic-appearing or diaphoretic.  HENT:     Right Ear: Tympanic membrane, ear canal and external ear normal. There is no impacted cerumen.     Left Ear: Tympanic membrane, ear canal and external ear normal. There is no impacted cerumen.     Nose: Nose normal. No congestion or rhinorrhea.     Mouth/Throat:     Mouth: Mucous membranes are moist.     Pharynx: Oropharynx is clear. No oropharyngeal exudate or posterior oropharyngeal erythema.  Eyes:     General: No scleral icterus.       Right eye: No discharge.        Left eye: No discharge.     Extraocular Movements: Extraocular movements intact.     Conjunctiva/sclera: Conjunctivae normal.  Neck:     Vascular: No carotid bruit.  Cardiovascular:     Rate and Rhythm: Normal rate and regular rhythm.     Pulses: Normal pulses.     Heart sounds: Normal heart sounds. No murmur heard.    No friction rub. No gallop.  Pulmonary:     Effort: Pulmonary effort is normal. No respiratory distress.     Breath sounds: Normal breath sounds. No stridor. No wheezing, rhonchi or rales.  Chest:     Chest wall: No mass, lacerations, deformity, swelling, tenderness or crepitus.  Breasts:    Tanner Score is 5.     Right: No swelling, bleeding, inverted nipple, mass, nipple discharge, skin change or tenderness.     Left: No swelling,  bleeding, inverted nipple, mass, nipple discharge, skin change or tenderness.  Abdominal:     General: Bowel sounds are normal. There is no distension.     Palpations: Abdomen is soft. There is no mass.     Tenderness: There is no abdominal tenderness. There is no right CVA tenderness, left CVA tenderness, guarding or rebound.     Hernia: No hernia is present.  Musculoskeletal:        General: No swelling, tenderness, deformity or signs of injury.     Cervical back: Normal range of motion and neck supple. No rigidity or tenderness.     Right lower leg: No edema.     Left lower leg: No edema.  Lymphadenopathy:     Cervical: No  cervical adenopathy.     Upper Body:     Right upper body: No supraclavicular, axillary or pectoral adenopathy.     Left upper body: No supraclavicular, axillary or pectoral adenopathy.  Skin:    General: Skin is warm and dry.     Capillary Refill: Capillary refill takes less than 2 seconds.     Coloration: Skin is not jaundiced or pale.     Findings: No bruising, erythema, lesion or rash.  Neurological:     Mental Status: She is alert and oriented to person, place, and time.     Cranial Nerves: No cranial nerve deficit.     Motor: No weakness.     Gait: Gait normal.  Psychiatric:        Mood and Affect: Mood normal.        Behavior: Behavior normal.        Thought Content: Thought content normal.        Judgment: Judgment normal.     No results found for any visits on 06/18/24.     Assessment & Plan:    Routine Health Maintenance and Physical Exam  Immunization History  Administered Date(s) Administered   Influenza Split 08/10/2012   Moderna Sars-Covid-2 Vaccination 01/13/2020, 02/14/2020, 09/16/2020   Pneumococcal Conjugate-13 08/29/2023   Td 04/27/2013    Health Maintenance  Topic Date Due   HIV Screening  Never done   Hepatitis C Screening  Never done   Zoster Vaccines- Shingrix (1 of 2) Never done   DTaP/Tdap/Td (2 - Tdap) 04/28/2023    DEXA SCAN  Never done   COVID-19 Vaccine (4 - 2024-25 season) 07/10/2023   Pneumococcal Vaccine: 50+ Years (2 of 2 - PPSV23, PCV20, or PCV21) 10/24/2023   Medicare Annual Wellness (AWV)  12/24/2023   INFLUENZA VACCINE  06/08/2024   Diabetic kidney evaluation - Urine ACR  08/28/2028 (Originally 07/08/1976)   HEMOGLOBIN A1C  09/21/2024   MAMMOGRAM  12/30/2024   Diabetic kidney evaluation - eGFR measurement  03/21/2025   OPHTHALMOLOGY EXAM  05/03/2025   Colonoscopy  05/31/2029   Hepatitis B Vaccines  Aged Out   HPV VACCINES  Aged Out   Meningococcal B Vaccine  Aged Out    Discussed health benefits of physical activity, and encouraged her to engage in regular exercise appropriate for her age and condition.  Problem List Items Addressed This Visit       Cardiovascular and Mediastinum   Essential hypertension   Systolic blood pressure is elevated but diastolic blood pressure is well-controlled Continue amlodipine  5 mg daily DASH diet and commitment to daily physical activity for a minimum of 30 minutes discussed and encouraged, as a part of hypertension management. The importance of attaining a healthy weight is also discussed.     06/18/2024   10:36 AM 06/18/2024   10:05 AM 06/18/2024    9:54 AM 05/31/2024    9:40 AM 05/31/2024    9:30 AM 05/31/2024    9:20 AM 05/31/2024    9:17 AM  BP/Weight  Systolic BP 143 148 138 148 130 125 128  Diastolic BP 62 67 59 79 65 69 75  Wt. (Lbs)   220      BMI   38.97 kg/m2                 Digestive   Acid reflux disease    Takes pantoprazole  40mg  as needed. Discussed dietary modifications to manage symptoms. - Advise dietary modifications to avoid trigger foods and  not eat late at night.      Colon polyps    History of precancerous colonic polyps Three precancerous polyps removed. Advised repeat colonoscopy in five years. Discussed family history and symptom monitoring. - Advise to contact healthcare provider if symptoms such as bleeding  or abdominal pain occur.          Other   Hypercholesterolemia with LDL greater than 190 mg/dL    LDL improved to 892 mg/dL with Repatha . Goal LDL <70 mg/dL. Discussed starting Zetia .  Continue Repatha  40 mg injection every 2 weeks - Encourage starting Zetia  10 mg daily. - Check cholesterol level in two months.      Class 2 severe obesity with serious comorbidity and body mass index (BMI) of 39.0 to 39.9 in adult (HCC)   Wt Readings from Last 3 Encounters:  06/18/24 220 lb (99.8 kg)  05/31/24 220 lb (99.8 kg)  05/01/24 220 lb 8 oz (100 kg)   Body mass index is 38.97 kg/m.  Discussed diet and exercise importance. - Encourage meal preparation focusing on vegetables and protein. - Advise moderate to vigorous exercise, 30 minutes five days a week.      Vitamin D  deficiency   Relevant Orders   VITAMIN D  25 Hydroxy (Vit-D Deficiency, Fractures)   Post-menopausal   Relevant Orders   DG Bone Density   Annual physical exam - Primary   Annual exam as documented.  Counseling done include healthy lifestyle involving committing to 150 minutes of exercise per week, heart healthy diet, and attaining healthy weight. The importance of adequate sleep also discussed.  Regular use of seat belt and home safety were also discussed . Changes in health habits are decided on by patient with goals and time frames set for achieving them. Immunization and cancer screening  needs are specifically addressed at this visit.    DEXA scan and mammogram ordered Advised to get Tdap vaccine, shingles vaccine and pneumococcal vaccine at the pharmacy      Other Visit Diagnoses       Screening mammogram for breast cancer       Relevant Orders   MM Digital Screening     Screening for endocrine, nutritional, metabolic and immunity disorder       Relevant Orders   CBC   CMP14+EGFR   Lipid panel   Hemoglobin A1c   HIV Antibody (routine testing w rflx)   Hepatitis C antibody      Return in about 2  months (around 08/18/2024) for FASTING LABS THIS WEEK, HYPERLIPIDEMIA, HTN.     Derinda Bartus R Glenroy Crossen, FNP

## 2024-06-18 NOTE — Assessment & Plan Note (Signed)
 Annual exam as documented.  Counseling done include healthy lifestyle involving committing to 150 minutes of exercise per week, heart healthy diet, and attaining healthy weight. The importance of adequate sleep also discussed.  Regular use of seat belt and home safety were also discussed . Changes in health habits are decided on by patient with goals and time frames set for achieving them. Immunization and cancer screening  needs are specifically addressed at this visit.    DEXA scan and mammogram ordered Advised to get Tdap vaccine, shingles vaccine and pneumococcal vaccine at the pharmacy

## 2024-06-18 NOTE — Assessment & Plan Note (Signed)
 Systolic blood pressure is elevated but diastolic blood pressure is well-controlled Continue amlodipine  5 mg daily DASH diet and commitment to daily physical activity for a minimum of 30 minutes discussed and encouraged, as a part of hypertension management. The importance of attaining a healthy weight is also discussed.     06/18/2024   10:36 AM 06/18/2024   10:05 AM 06/18/2024    9:54 AM 05/31/2024    9:40 AM 05/31/2024    9:30 AM 05/31/2024    9:20 AM 05/31/2024    9:17 AM  BP/Weight  Systolic BP 143 148 138 148 130 125 128  Diastolic BP 62 67 59 79 65 69 75  Wt. (Lbs)   220      BMI   38.97 kg/m2

## 2024-06-19 ENCOUNTER — Telehealth: Payer: Self-pay

## 2024-06-19 ENCOUNTER — Other Ambulatory Visit

## 2024-06-19 NOTE — Telephone Encounter (Signed)
 Copied from CRM [id number]. Topic: Clinical - Request for Lab/Test Order >> Jun 19, 2024 10:28 AM Rebecca [name] wrote: Pt went to [address] for lab work however they informed her they do not have orders for her lab. Pt left but still need her labs done. She stated she is not upset but just wanted to let the office know she showed up. I tried to call [facility] however the call was disconnected

## 2024-06-25 NOTE — Telephone Encounter (Signed)
 Called pt concerning labs. No answer I see that she is scheduled for next week . KH

## 2024-07-03 ENCOUNTER — Ambulatory Visit
Admission: RE | Admit: 2024-07-03 | Discharge: 2024-07-03 | Disposition: A | Discharge status: Home / Self Care | Source: Ambulatory Visit | Attending: Nurse Practitioner | Admitting: Nurse Practitioner

## 2024-07-03 ENCOUNTER — Other Ambulatory Visit

## 2024-07-03 ENCOUNTER — Other Ambulatory Visit: Payer: Self-pay

## 2024-07-03 DIAGNOSIS — Z1231 Encounter for screening mammogram for malignant neoplasm of breast: Secondary | ICD-10-CM

## 2024-07-06 ENCOUNTER — Other Ambulatory Visit

## 2024-07-06 DIAGNOSIS — E559 Vitamin D deficiency, unspecified: Secondary | ICD-10-CM

## 2024-07-06 DIAGNOSIS — Z13228 Encounter for screening for other metabolic disorders: Secondary | ICD-10-CM

## 2024-07-07 LAB — CMP14+EGFR
ALT: 9 IU/L (ref 0–32)
AST: 21 IU/L (ref 0–40)
Albumin: 4.6 g/dL (ref 3.9–4.9)
Alkaline Phosphatase: 113 IU/L (ref 44–121)
BUN/Creatinine Ratio: 20 (ref 12–28)
BUN: 14 mg/dL (ref 8–27)
Bilirubin Total: 0.5 mg/dL (ref 0.0–1.2)
CO2: 23 mmol/L (ref 20–29)
Calcium: 9.6 mg/dL (ref 8.7–10.3)
Chloride: 101 mmol/L (ref 96–106)
Creatinine, Ser: 0.7 mg/dL (ref 0.57–1.00)
Globulin, Total: 3.2 g/dL (ref 1.5–4.5)
Glucose: 85 mg/dL (ref 70–99)
Potassium: 3.7 mmol/L (ref 3.5–5.2)
Sodium: 141 mmol/L (ref 134–144)
Total Protein: 7.8 g/dL (ref 6.0–8.5)
eGFR: 96 mL/min/1.73 (ref >59)

## 2024-07-07 LAB — LIPID PANEL
Chol/HDL Ratio: 3.1 ratio (ref 0.0–4.4)
Cholesterol, Total: 259 mg/dL — ABNORMAL HIGH (ref 100–199)
HDL: 83 mg/dL (ref >39)
LDL Chol Calc (NIH): 148 mg/dL — ABNORMAL HIGH (ref 0–99)
Triglycerides: 159 mg/dL — ABNORMAL HIGH (ref 0–149)
VLDL Cholesterol Cal: 28 mg/dL (ref 5–40)

## 2024-07-07 LAB — CBC
Hematocrit: 41 % (ref 34.0–46.6)
Hemoglobin: 13.6 g/dL (ref 11.1–15.9)
MCH: 30.8 pg (ref 26.6–33.0)
MCHC: 33.2 g/dL (ref 31.5–35.7)
MCV: 93 fL (ref 79–97)
Platelets: 223 x10E3/uL (ref 150–450)
RBC: 4.42 x10E6/uL (ref 3.77–5.28)
RDW: 13.3 % (ref 11.7–15.4)
WBC: 8 x10E3/uL (ref 3.4–10.8)

## 2024-07-07 LAB — HEMOGLOBIN A1C
Est. average glucose Bld gHb Est-mCnc: 120 mg/dL
Hgb A1c MFr Bld: 5.8 % — ABNORMAL HIGH (ref 4.8–5.6)

## 2024-07-07 LAB — HIV ANTIBODY (ROUTINE TESTING W REFLEX): HIV Screen 4th Generation wRfx: NONREACTIVE

## 2024-07-07 LAB — HEPATITIS C ANTIBODY: Hep C Virus Ab: NONREACTIVE

## 2024-07-07 LAB — VITAMIN D 25 HYDROXY (VIT D DEFICIENCY, FRACTURES): Vit D, 25-Hydroxy: 23.6 ng/mL — ABNORMAL LOW (ref 30.0–100.0)

## 2024-07-10 ENCOUNTER — Ambulatory Visit: Admitting: Adult Health

## 2024-07-10 ENCOUNTER — Encounter: Payer: Self-pay | Admitting: Adult Health

## 2024-07-10 ENCOUNTER — Ambulatory Visit: Payer: Self-pay | Admitting: Nurse Practitioner

## 2024-07-10 ENCOUNTER — Other Ambulatory Visit: Payer: Self-pay | Admitting: Nurse Practitioner

## 2024-07-10 VITALS — BP 140/70 | Temp 98.0°F | Ht 63.0 in | Wt 222.6 lb

## 2024-07-10 DIAGNOSIS — E7800 Pure hypercholesterolemia, unspecified: Secondary | ICD-10-CM

## 2024-07-10 DIAGNOSIS — R9389 Abnormal findings on diagnostic imaging of other specified body structures: Secondary | ICD-10-CM

## 2024-07-10 DIAGNOSIS — R918 Other nonspecific abnormal finding of lung field: Secondary | ICD-10-CM

## 2024-07-10 DIAGNOSIS — R053 Chronic cough: Secondary | ICD-10-CM

## 2024-07-10 DIAGNOSIS — K219 Gastro-esophageal reflux disease without esophagitis: Secondary | ICD-10-CM | POA: Diagnosis not present

## 2024-07-10 MED ORDER — EZETIMIBE 10 MG PO TABS: 10.0000 mg | ORAL_TABLET | Freq: Every day | ORAL | 3 refills | Status: AC

## 2024-07-10 MED ORDER — STIOLTO RESPIMAT 2.5-2.5 MCG/ACT IN AERS: 2.0000 | INHALATION_SPRAY | Freq: Every day | RESPIRATORY_TRACT | 5 refills | Status: AC

## 2024-07-10 NOTE — Patient Instructions (Addendum)
 Continue on Protonix  40mg  daily in am  Add Pepcid  20mg  At bedtime   GERD diet.  Begin Stiolto 2 puffs daily.  Add Flutter valve Twice daily   Robitussin DM 1 tsp every 6hr as needed for cough  Labs today.  Follow up in 3 months with Dr. Geronimo and As needed

## 2024-07-10 NOTE — Progress Notes (Unsigned)
 @Patient  ID: Rebecca [name], female    DOB: 1956/07/26, 68 y.o.   MRN: [id number]  No chief complaint on file.   Referring provider: Paseda, Folashade R, FNP  HPI: 68 year old female never smoker seen for pulmonary consult January 19, 2024 for abnormal CT chest  TEST/EVENTS :  Cardiac CT October 31, 2023 showed groundglass attenuation, septal thickening and bronchiectasis in the lower lobes bilaterally  High-resolution CT chest April 19, 2024 showed bronchiectasis and mild groundglass in the lingula and both lower lobes.  No honeycombing or architectural distortion findings are suggestive of an alternative diagnosis may be postinfectious/postinflammatory in etiology,vs fibrotic HSP vs Mild Fibrotic NSIP ,  incidental finding thyroid  nodules.   07/10/2024 Follow up : Abnormal CT chest, bronchiectasis, PFT /CT results Discussed the use of AI scribe software for clinical note transcription with the patient, who gave verbal consent to proceed.  History of Present Illness Rebecca [name] is a 68 year old female who presents for a 6 month follow up. She was seen in March for a pulmonary consult for abnormal CT chest.  She has a history of frequent colds but no recurrent bronchitis, asthma flare-ups, or pneumonia. No chronic sinus problems or drainage. Cardiac CT October 31, 2023 showed groundglass attenuation, septal thickening and bronchiectasis in the lower lobes bilaterally. Last visit was recommended to get HRCT chest . High-resolution CT chest April 19, 2024 showed bronchiectasis and mild groundglass in the lingula and both lower lobes.  No honeycombing or architectural distortion findings are suggestive of an alternative diagnosis may be postinfectious/postinflammatory in etiology, vs fibrotic HSP vs Mild Fibrotic NSIP , incidental finding thyroid  nodules. PFT 04/2024 showed mild restriction, no airflow obstruction. FEV1 80%, ratio 93, FVC 65%, DLCO 98%. She denies a history of childhood asthma  but has a recent onset of daily coughing, especially during meetings, necessitating the use of cough drops.  She experiences significant reflux and is managing it with pantoprazole  once daily.   No history of autoimmune problems such as lupus or rheumatoid arthritis. She has never smoked, does not have pets, a hot tub, or exposure to birds or chickens. She had thyroid  nodules removed about ten years ago and continues to have them monitored with ultrasounds.Followed by PCP   Socially, she is retired but remains active during the day. She recently had a busy weekend with house guests for her birthday, which she found tiring. She previously lived in an old house with carpets that she suspected contributed to her frequent colds, but after replacing them with hardwood, she noticed an improvement.    Allergies  Allergen Reactions   Prednisolone Other (See Comments)    Patient can't remember  Other reaction(s): Delusions (intolerance) Patient can't remember    Prednisone Other (See Comments)    Other reaction(s): BAD DREAMS/HALLUCINATIONS  Other Reaction(s): BAD DREAMS/HALLUCINATIONS    Immunization History  Administered Date(s) Administered   Influenza Split 08/10/2012   Moderna Sars-Covid-2 Vaccination 01/13/2020, 02/14/2020, 09/16/2020   Pneumococcal Conjugate-13 08/29/2023   Td 04/27/2013    Past Medical History:  Diagnosis Date   Acid reflux    Anxiety    High blood pressure    Hyperlipidemia    Neck pain    Obesity    Peritonsillar abscess 02/03/2017   PTSD (post-traumatic stress disorder)    Sleep apnea    Sleep deprivation    Thyroid  disease    monitoring a nodule 1/9    Tobacco History: Social History   Tobacco Use  Smoking Status  Never   Passive exposure: Past  Smokeless Tobacco Never   Counseling given: Not Answered   Outpatient Medications Prior to Visit  Medication Sig Dispense Refill   amLODipine  (NORVASC ) 5 MG tablet Take 1 tablet (5 mg total) by  mouth daily. 90 tablet 1   B Complex Vitamins (B-COMPLEX/B-12 PO) Take by mouth.     cholecalciferol (VITAMIN D ) 1000 units tablet Take 5,000 Units by mouth daily.     ezetimibe  (ZETIA ) 10 MG tablet Take 1 tablet (10 mg total) by mouth daily. 90 tablet 3   Omega-3 Fatty Acids (FISH OIL) 1000 MG CAPS Take by mouth.     pantoprazole  (PROTONIX ) 40 MG tablet TAKE 1 TABLET(40 MG) BY MOUTH DAILY (Patient taking differently: TAKE 1 TABLET(40 MG) BY MOUTH DAILY) 30 tablet 3   REPATHA  SURECLICK 140 MG/ML SOAJ ADMINISTER 1 ML UNDER THE SKIN EVERY 14 DAYS 2 mL 3   topiramate  (TOPAMAX ) 25 MG tablet Take 1 tablet (25 mg total) by mouth daily with supper. (Patient not taking: Reported on 06/18/2024) 30 tablet 0   traZODone  (DESYREL ) 50 MG tablet Take 0.5-1 tablets (25-50 mg total) by mouth at bedtime as needed for sleep. 90 tablet 3   No facility-administered medications prior to visit.     Review of Systems:   Constitutional:   No  weight loss, night sweats,  Fevers, chills,+ fatigue, or  lassitude.  HEENT:   No headaches,  Difficulty swallowing,  Tooth/dental problems, or  Sore throat,                No sneezing, itching, ear ache, nasal congestion, post nasal drip,   CV:  No chest pain,  Orthopnea, PND, swelling in lower extremities, anasarca, dizziness, palpitations, syncope.   GI  No heartburn, indigestion, abdominal pain, nausea, vomiting, diarrhea, change in bowel habits, loss of appetite, bloody stools.   Resp: .  No chest wall deformity  Skin: no rash or lesions.  GU: no dysuria, change in color of urine, no urgency or frequency.  No flank pain, no hematuria   MS:  No joint pain or swelling.  No decreased range of motion.  No back pain.    Physical Exam  GEN: A/Ox3; pleasant , NAD, well nourished    HEENT:  [place]/AT,  NOSE-clear, THROAT-clear, no lesions, no postnasal drip or exudate noted.   NECK:  Supple w/ fair ROM; no JVD; normal carotid impulses w/o bruits; no thyromegaly or  nodules palpated; no lymphadenopathy.    RESP  Clear  P & A; w/o, wheezes/ rales/ or rhonchi. no accessory muscle use, no dullness to percussion  CARD:  RRR, no m/r/g, no peripheral edema, pulses intact, no cyanosis or clubbing.  GI:   Soft & nt; nml bowel sounds; no organomegaly or masses detected.   Musco: Warm bil, no deformities or joint swelling noted.   Neuro: alert, no focal deficits noted.    Skin: Warm, no lesions or rashes    Lab Results:  CBC    Component Value Date/Time   WBC 8.0 07/06/2024 0840   WBC 8.8 02/28/2018 1829   WBC 7.9 05/09/2017 0902   RBC 4.42 07/06/2024 0840   RBC 4.81 02/28/2018 1829   RBC 4.31 05/09/2017 0902   HGB 13.6 07/06/2024 0840   HCT 41.0 07/06/2024 0840   PLT 223 07/06/2024 0840   MCV 93 07/06/2024 0840   MCH 30.8 07/06/2024 0840   MCH 29.4 02/28/2018 1829   MCH 29.9 05/09/2017 0902  MCHC 33.2 07/06/2024 0840   MCHC 31.7 (A) 02/28/2018 1829   MCHC 33.0 05/09/2017 0902   RDW 13.3 07/06/2024 0840   LYMPHSABS 3.1 10/15/2022 1625   MONOABS 632 05/09/2017 0902   EOSABS 0.3 10/15/2022 1625   BASOSABS 0.1 10/15/2022 1625    BMET    Component Value Date/Time   NA 141 07/06/2024 0840   K 3.7 07/06/2024 0840   CL 101 07/06/2024 0840   CO2 23 07/06/2024 0840   GLUCOSE 85 07/06/2024 0840   GLUCOSE 94 05/09/2017 0902   BUN 14 07/06/2024 0840   CREATININE 0.70 07/06/2024 0840   CREATININE 0.62 05/09/2017 0902   CALCIUM  9.6 07/06/2024 0840   GFRNONAA 89 04/22/2020 1313   GFRNONAA >89 05/09/2017 0902   GFRAA 103 04/22/2020 1313   GFRAA >89 05/09/2017 0902    BNP No results found for: BNP  ProBNP  Imaging: No results found.  Administration History     None          Latest Ref Rng & Units 04/24/2024    8:45 AM  PFT Results  FVC-Pre L 1.97   FVC-Predicted Pre % 64   FVC-Post L 2.00   FVC-Predicted Post % 65   Pre FEV1/FVC % % 89   Post FEV1/FCV % % 93   FEV1-Pre L 1.75   FEV1-Predicted Pre % 75   FEV1-Post  L 1.87   DLCO uncorrected ml/min/mmHg 18.96   DLCO UNC% % 98   DLCO corrected ml/min/mmHg 18.96   DLCO COR %Predicted % 98   DLVA Predicted % 117   TLC L 2.92   TLC % Predicted % 59   RV % Predicted % 29     No results found for: NITRICOXIDE      Assessment & Plan:   No problem-specific Assessment & Plan notes found for this encounter.   Assessment and Plan Assessment & Plan Abnormal CT with Bronchiectasis and Ground glass lingula/BB (not UIP-possible post inflammatory changes vs Fibrotic HSP vs Mild Fibrotic NSIP  Pulmonary function tests show moderate restriction without significant airflow obstruction. The differential diagnosis includes scarring from previous infections or possible reflux, with early interstitial lung disease considered but less likely. Provide a flutter valve for use twice daily to aid mucus mobilization. Prescribe Stiolto inhaler, two puffs once daily, to assess its impact on the cough. Perform basic autoimmune labs to rule out underlying autoimmune conditions. Check hypersensitivity pneumonitis panel on return   Gastroesophageal reflux disease (GERD)   She experiences ongoing GERD symptoms despite treatment with pantoprazole  and Pepcid . GERD may contribute to pulmonary scarring and chronic cough. Continue pantoprazole  once daily and add Pepcid  at bedtime. Advise avoiding eating 2-3 hours before bedtime and remaining upright after meals.   Plan  Patient Instructions  Continue on Protonix  40mg  daily in am  Add Pepcid  20mg  At bedtime   GERD diet.  Begin Stiolto 2 puffs daily.  Add Flutter valve Twice daily   Robitussin DM 1 tsp every 6hr as needed for cough  Labs today.  Follow up in 3 months with Dr. Geronimo and As needed         Madelin Stank, NP 07/10/2024

## 2024-07-10 NOTE — Progress Notes (Unsigned)
 Patient seen in the office today and instructed on use of flutter valve.  Patient expressed understanding and demonstrated technique.

## 2024-07-12 ENCOUNTER — Ambulatory Visit: Payer: Self-pay | Admitting: Adult Health

## 2024-07-12 LAB — ANA: Anti Nuclear Antibody (ANA): NEGATIVE

## 2024-07-12 LAB — CYCLIC CITRUL PEPTIDE ANTIBODY, IGG: Cyclic Citrullin Peptide Ab: <16 U

## 2024-07-12 LAB — RHEUMATOID FACTOR: Rheumatoid fact SerPl-aCnc: 12 [IU]/mL (ref <14)

## 2024-08-21 ENCOUNTER — Ambulatory Visit (INDEPENDENT_AMBULATORY_CARE_PROVIDER_SITE_OTHER): Payer: Self-pay | Admitting: Nurse Practitioner

## 2024-08-21 ENCOUNTER — Encounter: Payer: Self-pay | Admitting: Nurse Practitioner

## 2024-08-21 VITALS — BP 118/62 | HR 64 | Ht 63.0 in

## 2024-08-21 DIAGNOSIS — I10 Essential (primary) hypertension: Secondary | ICD-10-CM

## 2024-08-21 DIAGNOSIS — Z6839 Body mass index (BMI) 39.0-39.9, adult: Secondary | ICD-10-CM | POA: Diagnosis not present

## 2024-08-21 DIAGNOSIS — E66812 Obesity, class 2: Secondary | ICD-10-CM

## 2024-08-21 DIAGNOSIS — E782 Mixed hyperlipidemia: Secondary | ICD-10-CM | POA: Insufficient documentation

## 2024-08-21 DIAGNOSIS — Z152 Genetic susceptibility to obesity: Secondary | ICD-10-CM

## 2024-08-21 DIAGNOSIS — Z0000 Encounter for general adult medical examination without abnormal findings: Secondary | ICD-10-CM | POA: Diagnosis not present

## 2024-08-21 DIAGNOSIS — E8882 Obesity due to disruption of MC4R pathway: Secondary | ICD-10-CM

## 2024-08-21 MED ORDER — REPATHA SURECLICK 140 MG/ML ~~LOC~~ SOAJ: 140.0000 mg | SUBCUTANEOUS | 3 refills | Status: AC

## 2024-08-21 NOTE — Patient Instructions (Signed)
 1. 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 Essential hypertension   3. Hypercholesterolemia with LDL greater than 190 mg/dL  - Lipid panel    It is important that you exercise regularly at least 30 minutes 5 times a week as tolerated  Think about what you will eat, plan ahead. Choose  clean, green, fresh or frozen over canned, processed or packaged foods which are more sugary, salty and fatty. 70 to 75% of food eaten should be vegetables and fruit. Three meals at set times with snacks allowed between meals, but they must be fruit or vegetables. Aim to eat over a 12 hour period , example 7 am to 7 pm, and STOP after  your last meal of the day. Drink water,generally about 64 ounces per day, no other drink is as healthy. Fruit juice is best enjoyed in a healthy way, by EATING the fruit.  Thanks for choosing [facility] we consider it a privelige to serve you.

## 2024-08-21 NOTE — Assessment & Plan Note (Addendum)
  Bone density scan not completed. Declined flu vaccine, open to next time. -provided contact information for scheduling DEXA scan. - Encourage receiving the flu vaccine at the next visit. - Discuss the importance of vaccinations in preventing diseases.

## 2024-08-21 NOTE — Progress Notes (Signed)
 Established Patient Office Visit  Subjective:  Patient ID: Rebecca [name], female    DOB: 09-23-1956  Age: 68 y.o. MRN: [id number]  CC:  Chief Complaint  Patient presents with   Medical Management of Chronic Issues    HPI   Discussed the use of AI scribe software for clinical note transcription with the patient, who gave verbal consent to proceed.  History of Present Illness Rebecca [name] is a 68 year old female  has a past medical history of Acid reflux, Anxiety, High blood pressure, Hyperlipidemia, Neck pain, Obesity, Peritonsillar abscess (02/03/2017), PTSD (post-traumatic stress disorder), Sleep apnea, Sleep deprivation, and Thyroid  disease.  with  who presents for follow-up on her blood pressure and cholesterol management.  She was last seen in August with an elevated blood pressure of 143/62 mmHg. Since then, she has been taking amlodipine  5 mg daily. Her most recent blood pressure was 118/62 mmHg.  For her hyperlipidemia, she has been consistently using Repatha  injections but has not started Zetia  10 mg daily due to forgetfulness. Her cholesterol levels have improved but are not yet at the ideal target.  She has made lifestyle changes, including a high-protein, low-carb diet with meals consisting of egg whites, uncured [place] bacon, Greek yogurt, string cheese, and vegetables like broccoli and spinach. She occasionally lapses in her diet during social events but is making efforts to eat healthily and drink water consistently. She is considering joining a weight loss program involving shots to aid in weight loss.  She is trying to incorporate more physical activity, such as walking in the neighborhood and planning to return to the gym. Her travel schedule has impacted her ability to maintain a consistent routine, but she is working on reducing travel to focus on her health.  She has not yet undergone a bone density scan despite it being ordered twice previously. She is unsure  if it was scheduled. She has not received a flu shot today and plans to get it at a later date.    Assessment & Plan     Past Medical History:  Diagnosis Date   Acid reflux    Anxiety    High blood pressure    Hyperlipidemia    Neck pain    Obesity    Peritonsillar abscess 02/03/2017   PTSD (post-traumatic stress disorder)    Sleep apnea    Sleep deprivation    Thyroid  disease    monitoring a nodule 1/9    Past Surgical History:  Procedure Laterality Date   ABDOMINAL HYSTERECTOMY  partial   BREAST LUMPECTOMY Left    Over 20 years ago - benign   COLONOSCOPY     PARTIAL HYSTERECTOMY  2012    Family History  Problem Relation Age of Onset   Pancreatic cancer Mother    Cancer Mother        pancreatic cancer   Diabetes Mother    Heart disease Mother        rheumatic heart disease   Hyperlipidemia Mother    High blood pressure Mother    Cancer Father        renal, bone   Diabetes Father    Hyperlipidemia Father    High blood pressure Father    COPD Sister    Sleep apnea Sister    Lung cancer Sister        asbestosis caused lung CA   Lung cancer Brother        smoked   Kidney disease Brother  Colon cancer Maternal Aunt    Heart attack Maternal Grandfather    Esophageal cancer Neg Hx    Rectal cancer Neg Hx    Stomach cancer Neg Hx     Social History   Socioeconomic History   Marital status: Single    Spouse name: N/A   Number of children: 0   Years of education: 18.5   Highest education level: Master's degree (e.g., MA, MS, MEng, MEd, MSW, MBA)  Occupational History   Occupation: Retired    Associate Professor: Advice worker    Comment: [employer] x 30 years   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   Retired after 30 years, and took another job (commutes to [place] 4 days/week).   Social Drivers of Corporate investment banker Strain: Low Risk  (03/19/2024)   Overall Financial Resource Strain (CARDIA)    Difficulty of Paying Living Expenses: Not hard at all  Food Insecurity: No Food Insecurity (03/19/2024)   Hunger Vital Sign    Worried About Running Out of Food in the Last Year: Never true    Ran Out of Food in the Last Year: Never true  Transportation Needs: No Transportation Needs (03/19/2024)   PRAPARE - Administrator, Civil Service (Medical): No    Lack of Transportation (Non-Medical): No  Physical Activity: Sufficiently Active (03/19/2024)   Exercise Vital Sign    Days of Exercise per Week: 4 days    Minutes of Exercise per Session: 60 min  Stress: No Stress Concern Present (03/19/2024)   [employer] of Occupational Health - Occupational Stress Questionnaire    Feeling of Stress : Only a little  Social Connections: Moderately Integrated (03/19/2024)   Social Connection and Isolation Panel    Frequency of Communication with Friends and Family: More than three times a week    Frequency of Social Gatherings with Friends and Family: Once a week    Attends Religious Services: 1 to 4 times per year    Active Member of [employer] or Organizations: Yes    Attends Engineer, structural: More than 4 times per year    Marital Status: Never married  Intimate Partner Violence: Not At Risk (12/23/2022)   Humiliation, Afraid, Rape, and Kick questionnaire    Fear of Current or Ex-Partner: No    Emotionally Abused: No    Physically Abused: No    Sexually  Abused: No    Outpatient Medications Prior to Visit  Medication Sig Dispense Refill   amLODipine  (NORVASC ) 5 MG tablet Take 1 tablet (5 mg total) by mouth daily. 90 tablet 1   ezetimibe  (ZETIA ) 10 MG tablet Take 1 tablet (10 mg total) by mouth daily. 90 tablet 3   pantoprazole  (PROTONIX ) 40 MG tablet TAKE 1 TABLET(40 MG) BY MOUTH DAILY 30 tablet  3   Tiotropium Bromide-Olodaterol (STIOLTO RESPIMAT ) 2.5-2.5 MCG/ACT AERS Inhale 2 puffs into the lungs daily. 1 each 5   topiramate  (TOPAMAX ) 25 MG tablet Take 1 tablet (25 mg total) by mouth daily with supper. 30 tablet 0   traZODone  (DESYREL ) 50 MG tablet Take 0.5-1 tablets (25-50 mg total) by mouth at bedtime as needed for sleep. 90 tablet 3   REPATHA  SURECLICK 140 MG/ML SOAJ ADMINISTER 1 ML UNDER THE SKIN EVERY 14 DAYS 2 mL 3   B Complex Vitamins (B-COMPLEX/B-12 PO) Take by mouth.     cholecalciferol (VITAMIN D ) 1000 units tablet Take 5,000 Units by mouth daily.     escitalopram  (LEXAPRO ) 10 MG tablet Take 1 tablet (10 mg total) by mouth daily. (Patient not taking: Reported on 08/19/2018) 30 tablet 0   Omega-3 Fatty Acids (FISH OIL) 1000 MG CAPS Take by mouth.     No facility-administered medications prior to visit.    Allergies  Allergen Reactions   Prednisolone Other (See Comments)    Patient can't remember  Other reaction(s): Delusions (intolerance) Patient can't remember    Prednisone Other (See Comments)    Other reaction(s): BAD DREAMS/HALLUCINATIONS  Other Reaction(s): BAD DREAMS/HALLUCINATIONS    ROS Review of Systems  Constitutional:  Negative for appetite change, chills, fatigue and fever.  HENT:  Negative for congestion, postnasal drip, rhinorrhea and sneezing.   Respiratory:  Negative for cough, shortness of breath and wheezing.   Cardiovascular:  Negative for chest pain, palpitations and leg swelling.  Gastrointestinal:  Negative for abdominal pain, constipation, nausea and vomiting.  Genitourinary:  Negative for  difficulty urinating, dysuria, flank pain and frequency.  Musculoskeletal:  Negative for arthralgias, back pain, joint swelling and myalgias.  Skin:  Negative for color change, pallor, rash and wound.  Neurological:  Negative for dizziness, facial asymmetry, weakness, numbness and headaches.  Psychiatric/Behavioral:  Negative for behavioral problems, confusion, self-injury and suicidal ideas.       Objective:    Physical Exam Vitals and nursing note reviewed.  Constitutional:      General: She is not in acute distress.    Appearance: Normal appearance. She is obese. She is not ill-appearing, toxic-appearing or diaphoretic.  Eyes:     General: No scleral icterus.       Right eye: No discharge.        Left eye: No discharge.     Extraocular Movements: Extraocular movements intact.     Conjunctiva/sclera: Conjunctivae normal.  Cardiovascular:     Rate and Rhythm: Normal rate and regular rhythm.     Pulses: Normal pulses.     Heart sounds: Normal heart sounds. No murmur heard.    No friction rub. No gallop.  Pulmonary:     Effort: Pulmonary effort is normal. No respiratory distress.     Breath sounds: Normal breath sounds. No stridor. No wheezing, rhonchi or rales.  Chest:     Chest wall: No tenderness.  Abdominal:     General: There is no distension.     Palpations: Abdomen is soft.     Tenderness: There is no abdominal tenderness. There is no right CVA tenderness, left CVA tenderness or guarding.  Musculoskeletal:        General: No swelling, tenderness, deformity or signs of injury.     Right lower leg: No edema.     Left lower leg: No edema.  Skin:    General: Skin is warm and dry.     Capillary Refill: Capillary refill  takes less than 2 seconds.     Coloration: Skin is not jaundiced or pale.     Findings: No bruising, erythema or lesion.  Neurological:     Mental Status: She is alert and oriented to person, place, and time.     Motor: No weakness.     Coordination:  Coordination normal.     Gait: Gait normal.  Psychiatric:        Mood and Affect: Mood normal.        Behavior: Behavior normal.        Thought Content: Thought content normal.        Judgment: Judgment normal.     BP 118/62   Pulse 64   Ht 5' 3 (1.6 m)   BMI 39.43 kg/m  Wt Readings from Last 3 Encounters:  07/10/24 222 lb 9.6 oz (101 kg)  06/18/24 220 lb (99.8 kg)  05/31/24 220 lb (99.8 kg)    Lab Results  Component Value Date   TSH 1.430 10/15/2022   Lab Results  Component Value Date   WBC 8.0 07/06/2024   HGB 13.6 07/06/2024   HCT 41.0 07/06/2024   MCV 93 07/06/2024   PLT 223 07/06/2024   Lab Results  Component Value Date   NA 141 07/06/2024   K 3.7 07/06/2024   CO2 23 07/06/2024   GLUCOSE 85 07/06/2024   BUN 14 07/06/2024   CREATININE 0.70 07/06/2024   BILITOT 0.5 07/06/2024   ALKPHOS 113 07/06/2024   AST 21 07/06/2024   ALT 9 07/06/2024   PROT 7.8 07/06/2024   ALBUMIN 4.6 07/06/2024   CALCIUM  9.6 07/06/2024   EGFR 96 07/06/2024   Lab Results  Component Value Date   CHOL 259 (H) 07/06/2024   Lab Results  Component Value Date   HDL 83 07/06/2024   Lab Results  Component Value Date   LDLCALC 148 (H) 07/06/2024   Lab Results  Component Value Date   TRIG 159 (H) 07/06/2024   Lab Results  Component Value Date   CHOLHDL 3.1 07/06/2024   Lab Results  Component Value Date   HGBA1C 5.8 (H) 07/06/2024      Assessment & Plan:   Problem List Items Addressed This Visit       Cardiovascular and Mediastinum   Essential hypertension   BP Readings from Last 3 Encounters:  08/21/24 118/62  07/10/24 (!) 140/70  06/18/24 (!) 143/62   HTN Controlled .  On amlodipine  5 mg daily Continue current medications. No changes in management. Discussed DASH diet and dietary sodium restrictions Continue to increase dietary efforts and exercise.         Relevant Medications   Evolocumab  (REPATHA  SURECLICK) 140 MG/ML SOAJ     Other   Mixed  hyperlipidemia - Primary (Chronic)   Lab Results  Component Value Date   CHOL 259 (H) 07/06/2024   HDL 83 07/06/2024   LDLCALC 148 (H) 07/06/2024   TRIG 159 (H) 07/06/2024   CHOLHDL 3.1 07/06/2024   Consistent with Repatha , forgot Zetia . Lifestyle modifications initiated. - Start Zetia  10 mg daily. - Continue Repatha  as scheduled. - Encourage adherence to lifestyle modifications, including diet and exercise. - Check lipid panel        Relevant Medications   Evolocumab  (REPATHA  SURECLICK) 140 MG/ML SOAJ   Other Relevant Orders   Lipid panel   Class 2 severe obesity with serious comorbidity and body mass index (BMI) of 39.0 to 39.9 in adult   Wt Readings  from Last 3 Encounters:  07/10/24 222 lb 9.6 oz (101 kg)  06/18/24 220 lb (99.8 kg)  05/31/24 220 lb (99.8 kg)   Body mass index is 39.43 kg/m.  Actively making lifestyle changes. Considering weight loss program with injections, not covered by insurance. Weight increased by 2 pounds. Counseled on low-carb diet, encouraged moderate exercises at least 150 minutes weekly as tolerated - Advise on reliable sources for weight loss injections if pursued. - Monitor weight and adjust plan as needed.        Health care maintenance    Bone density scan not completed. Declined flu vaccine, open to next time. -provided contact information for scheduling DEXA scan. - Encourage receiving the flu vaccine at the next visit. - Discuss the importance of vaccinations in preventing diseases.       Meds ordered this encounter  Medications   Evolocumab  (REPATHA  SURECLICK) 140 MG/ML SOAJ    Sig: Inject 140 mg into the skin every 14 (fourteen) days.    Dispense:  2 mL    Refill:  3    Follow-up: Return in about 4 months (around 12/22/2024) for HTN.    Atziri Zubiate R Terel Bann, FNP

## 2024-08-21 NOTE — Assessment & Plan Note (Signed)
 Lab Results  Component Value Date   CHOL 259 (H) 07/06/2024   HDL 83 07/06/2024   LDLCALC 148 (H) 07/06/2024   TRIG 159 (H) 07/06/2024   CHOLHDL 3.1 07/06/2024   Consistent with Repatha , forgot Zetia . Lifestyle modifications initiated. - Start Zetia  10 mg daily. - Continue Repatha  as scheduled. - Encourage adherence to lifestyle modifications, including diet and exercise. - Check lipid panel

## 2024-08-21 NOTE — Assessment & Plan Note (Addendum)
 Wt Readings from Last 3 Encounters:  07/10/24 222 lb 9.6 oz (101 kg)  06/18/24 220 lb (99.8 kg)  05/31/24 220 lb (99.8 kg)   Body mass index is 39.43 kg/m.  Actively making lifestyle changes. Considering weight loss program with injections, not covered by insurance. Weight increased by 2 pounds. Counseled on low-carb diet, encouraged moderate exercises at least 150 minutes weekly as tolerated - Advise on reliable sources for weight loss injections if pursued. - Monitor weight and adjust plan as needed.

## 2024-08-21 NOTE — Assessment & Plan Note (Signed)
 BP Readings from Last 3 Encounters:  08/21/24 118/62  07/10/24 (!) 140/70  06/18/24 (!) 143/62   HTN Controlled .  On amlodipine  5 mg daily Continue current medications. No changes in management. Discussed DASH diet and dietary sodium restrictions Continue to increase dietary efforts and exercise.

## 2024-08-22 ENCOUNTER — Ambulatory Visit: Payer: Self-pay | Admitting: Nurse Practitioner

## 2024-08-22 LAB — LIPID PANEL
Chol/HDL Ratio: 4 ratio (ref 0.0–4.4)
Cholesterol, Total: 223 mg/dL — ABNORMAL HIGH (ref 100–199)
HDL: 56 mg/dL (ref >39)
LDL Chol Calc (NIH): 134 mg/dL — ABNORMAL HIGH (ref 0–99)
Triglycerides: 187 mg/dL — ABNORMAL HIGH (ref 0–149)
VLDL Cholesterol Cal: 33 mg/dL (ref 5–40)

## 2024-08-22 NOTE — Telephone Encounter (Signed)
-----   Message from Rebecca [name] sent at 08/22/2024  8:27 AM EDT ----- Your bad cholesterol level remains high.  Please start taking Zetia  10 mg daily as discussed yesterday.  Prescription already sent to your pharmacy Continue Repatha  140 mg injection every 2 weeks Eat a healthy diet, including lots of fruits and vegetables. Avoid foods with a lot of saturated and trans fats, such as red meat, butter, fried foods and cheese .  Lets recheck your labs in 2 months, Please schedule an appointment and come fasting on that day ----- Message ----- From: Interface, [facility] Lab Results In Sent: 08/22/2024   5:39 AM EDT To: Rebecca R Paseda, FNP

## 2024-08-22 NOTE — Telephone Encounter (Signed)
 Patient aware of results and recommendations.

## 2024-09-30 ENCOUNTER — Other Ambulatory Visit: Payer: Self-pay | Admitting: Nurse Practitioner

## 2024-09-30 DIAGNOSIS — I10 Essential (primary) hypertension: Secondary | ICD-10-CM

## 2024-10-09 ENCOUNTER — Encounter: Payer: Self-pay | Admitting: Adult Health

## 2024-10-09 ENCOUNTER — Ambulatory Visit: Admitting: Adult Health

## 2024-10-09 VITALS — BP 116/62 | HR 67 | Temp 98.7°F | Ht 63.0 in | Wt 204.6 lb

## 2024-10-09 DIAGNOSIS — K219 Gastro-esophageal reflux disease without esophagitis: Secondary | ICD-10-CM

## 2024-10-09 DIAGNOSIS — J479 Bronchiectasis, uncomplicated: Secondary | ICD-10-CM

## 2024-10-09 DIAGNOSIS — R9389 Abnormal findings on diagnostic imaging of other specified body structures: Secondary | ICD-10-CM

## 2024-10-09 DIAGNOSIS — R053 Chronic cough: Secondary | ICD-10-CM

## 2024-10-09 NOTE — Progress Notes (Signed)
 @Patient  ID: Rebecca [name], female    DOB: 07-Dec-1955, 68 y.o.   MRN: [id number]  Chief Complaint  Patient presents with   Follow-up    Chronic cough     Referring provider: Paseda, Folashade R, FNP  HPI: 68 year old female never smoker seen for pulmonary consult January 19, 2024 for abnormal CT chest with bronchiectasis and mild ground glass changes in the lingula and lower lobes felt to be postinfectious/postinflammatory versus early ILD    TEST/EVENTS : Reviewed 10/09/2024  Cardiac CT October 31, 2023 showed groundglass attenuation, septal thickening and bronchiectasis in the lower lobes bilaterally   High-resolution CT chest April 19, 2024 showed bronchiectasis and mild groundglass in the lingula and both lower lobes.  No honeycombing or architectural distortion findings are suggestive of an alternative diagnosis may be postinfectious/postinflammatory in etiology,vs fibrotic HSP vs Mild Fibrotic NSIP ,  incidental finding thyroid  nodules.   PFT 04/2024 showed mild restriction, no airflow obstruction. FEV1 80%, ratio 93, FVC 65%, DLCO 98%.     Discussed the use of AI scribe software for clinical note transcription with the patient, who gave verbal consent to proceed.  History of Present Illness Rebecca [name] is a 67 year old female with bronchiectasis who presents for a follow-up visit.  Patient returns for a 29-month follow-up.  Patient was seen in March for pulmonary consult for abnormal CT chest.  She was prone to frequent upper respiratory infection.  Was also having some increased intermittent cough.  CT chest April 19, 2024 showed bronchiectasis and mild ground glass in the lingula and lower lobes.  No honeycombing or architectural distortion findings.  Felt to have postinflammatory/postinfectious changes versus early ILD.  Pulmonary function testing was completed in June that showed mild restriction with no airflow obstruction.  She was recommended on aggressive GERD diet  and medical management with PPI therapy and H2 blocker.  She has experienced improvement in her breathing and a significant reduction in her cough, noting that she no longer wakes up coughing at night. She previously used Stiolto but discontinued due to no perceived benefit.  She has worked on aggressive dietary changes and weight loss.  Feels that this has really made the most difference in her symptoms.  She is no longer taking Pepcid .  She has been actively focusing on weight loss, reporting a return to the gym, changes in eating habits, and adherence to a low-carb, high-protein diet. Her diet includes two boiled eggs and turkey bacon for breakfast, and chicken breast with vegetables for other meals. She consumes about 80 grams of protein daily, supplemented by [employer] protein shakes, which she finds expensive but necessary to meet her protein goals.  She takes a triple magnesium complex at night, which aids in relaxation and sleep. Previously, she used magnesium glycinate for sleep but switched to a complex that includes Glastonate, Mayolite, and Tranex. This regimen has been beneficial for her sleep quality.  Regarding her reflux, she is not taking Pepcid  but continues to take Protonix . She has made dietary changes, such as avoiding fried foods and nitrates, which she believes have helped manage her reflux symptoms.  Labs last visit showed a negative rheumatoid factor, ANA and CCP      Allergies  Allergen Reactions   Prednisolone Other (See Comments)    Patient can't remember  Other reaction(s): Delusions (intolerance) Patient can't remember    Prednisone Other (See Comments)    Other reaction(s): BAD DREAMS/HALLUCINATIONS  Other Reaction(s): BAD DREAMS/HALLUCINATIONS    Immunization History  Administered Date(s) Administered   Influenza Split 08/10/2012   Moderna Sars-Covid-2 Vaccination 01/13/2020, 02/14/2020, 09/16/2020   Pneumococcal Conjugate-13 08/29/2023   Td 04/27/2013     Past Medical History:  Diagnosis Date   Acid reflux    Anxiety    High blood pressure    Hyperlipidemia    Neck pain    Obesity    Peritonsillar abscess 02/03/2017   PTSD (post-traumatic stress disorder)    Sleep apnea    Sleep deprivation    Thyroid  disease    monitoring a nodule 1/9    Tobacco History: Social History   Tobacco Use  Smoking Status Never   Passive exposure: Past  Smokeless Tobacco Never   Counseling given: Not Answered   Outpatient Medications Prior to Visit  Medication Sig Dispense Refill   amLODipine  (NORVASC ) 5 MG tablet TAKE 1 TABLET(5 MG) BY MOUTH DAILY 90 tablet 0   Evolocumab  (REPATHA  SURECLICK) 140 MG/ML SOAJ Inject 140 mg into the skin every 14 (fourteen) days. 2 mL 3   pantoprazole  (PROTONIX ) 40 MG tablet TAKE 1 TABLET(40 MG) BY MOUTH DAILY 30 tablet 3   traZODone  (DESYREL ) 50 MG tablet Take 0.5-1 tablets (25-50 mg total) by mouth at bedtime as needed for sleep. 90 tablet 3   ezetimibe  (ZETIA ) 10 MG tablet Take 1 tablet (10 mg total) by mouth daily. (Patient not taking: Reported on 10/09/2024) 90 tablet 3   Tiotropium Bromide-Olodaterol (STIOLTO RESPIMAT ) 2.5-2.5 MCG/ACT AERS Inhale 2 puffs into the lungs daily. (Patient not taking: Reported on 10/09/2024) 1 each 5   topiramate  (TOPAMAX ) 25 MG tablet Take 1 tablet (25 mg total) by mouth daily with supper. (Patient not taking: Reported on 10/09/2024) 30 tablet 0   No facility-administered medications prior to visit.     Review of Systems:   Constitutional:   No  weight loss, night sweats,  Fevers, chills, fatigue, or  lassitude.  HEENT:   No headaches,  Difficulty swallowing,  Tooth/dental problems, or  Sore throat,                No sneezing, itching, ear ache, nasal congestion, post nasal drip,   CV:  No chest pain,  Orthopnea, PND, swelling in lower extremities, anasarca, dizziness, palpitations, syncope.   GI  No heartburn, indigestion, abdominal pain, nausea, vomiting, diarrhea,  change in bowel habits, loss of appetite, bloody stools.   Resp: No shortness of breath with exertion or at rest.  No excess mucus, no productive cough,  No non-productive cough,  No coughing up of blood.  No change in color of mucus.  No wheezing.  No chest wall deformity  Skin: no rash or lesions.  GU: no dysuria, change in color of urine, no urgency or frequency.  No flank pain, no hematuria   MS:  No joint pain or swelling.  No decreased range of motion.  No back pain.    Physical Exam  BP 116/62   Pulse 67   Temp 98.7 F (37.1 C)   Ht 5' 3 (1.6 m) Comment: Per pt  Wt 204 lb 9.6 oz (92.8 kg)   SpO2 98% Comment: RA  BMI 36.24 kg/m   GEN: A/Ox3; pleasant , NAD, well nourished    HEENT:  [place]/AT,   NOSE-clear, THROAT-clear, no lesions, no postnasal drip or exudate noted.   NECK:  Supple w/ fair ROM; no JVD; normal carotid impulses w/o bruits; no thyromegaly or nodules palpated; no lymphadenopathy.    RESP  Clear  P &  A; w/o, wheezes/ rales/ or rhonchi. no accessory muscle use, no dullness to percussion  CARD:  RRR, no m/r/g, no peripheral edema, pulses intact, no cyanosis or clubbing.  GI:   Soft & nt; nml bowel sounds; no organomegaly or masses detected.   Musco: Warm bil, no deformities or joint swelling noted.   Neuro: alert, no focal deficits noted.    Skin: Warm, no lesions or rashes    Lab Results:Reviewed 10/09/2024   CBC    Component Value Date/Time   WBC 8.0 07/06/2024 0840   WBC 8.8 02/28/2018 1829   WBC 7.9 05/09/2017 0902   RBC 4.42 07/06/2024 0840   RBC 4.81 02/28/2018 1829   RBC 4.31 05/09/2017 0902   HGB 13.6 07/06/2024 0840   HCT 41.0 07/06/2024 0840   PLT 223 07/06/2024 0840   MCV 93 07/06/2024 0840   MCH 30.8 07/06/2024 0840   MCH 29.4 02/28/2018 1829   MCH 29.9 05/09/2017 0902   MCHC 33.2 07/06/2024 0840   MCHC 31.7 (A) 02/28/2018 1829   MCHC 33.0 05/09/2017 0902   RDW 13.3 07/06/2024 0840   LYMPHSABS 3.1 10/15/2022 1625   MONOABS  632 05/09/2017 0902   EOSABS 0.3 10/15/2022 1625   BASOSABS 0.1 10/15/2022 1625    BMET    Component Value Date/Time   NA 141 07/06/2024 0840   K 3.7 07/06/2024 0840   CL 101 07/06/2024 0840   CO2 23 07/06/2024 0840   GLUCOSE 85 07/06/2024 0840   GLUCOSE 94 05/09/2017 0902   BUN 14 07/06/2024 0840   CREATININE 0.70 07/06/2024 0840   CREATININE 0.62 05/09/2017 0902   CALCIUM  9.6 07/06/2024 0840   GFRNONAA 89 04/22/2020 1313   GFRNONAA >89 05/09/2017 0902   GFRAA 103 04/22/2020 1313   GFRAA >89 05/09/2017 0902    BNP No results found for: BNP  ProBNP    Component Value Date/Time   PROBNP 61.9 08/04/2012 2033    Imaging: No results found.  Administration History     None          Latest Ref Rng & Units 04/24/2024    8:45 AM  PFT Results  FVC-Pre L 1.97   FVC-Predicted Pre % 64   FVC-Post L 2.00   FVC-Predicted Post % 65   Pre FEV1/FVC % % 89   Post FEV1/FCV % % 93   FEV1-Pre L 1.75   FEV1-Predicted Pre % 75   FEV1-Post L 1.87   DLCO uncorrected ml/min/mmHg 18.96   DLCO UNC% % 98   DLCO corrected ml/min/mmHg 18.96   DLCO COR %Predicted % 98   DLVA Predicted % 117   TLC L 2.92   TLC % Predicted % 59   RV % Predicted % 29     No results found for: NITRICOXIDE      No data to display              Assessment & Plan:   Assessment and Plan Assessment & Plan Abnormal CT chest with bronchiectatic changes, ground glass changes bibasilar and lingula -maybe early ILD versus postinflammatory/postinfectious changes.  Pulmonary function testing showed mild to moderate restriction with normal diffusing capacity.  No perceived benefit on Stiolto. Clinically stable patient is very active has been on symptom burden currently.  Will repeat spirometry with DLCO in 6 months.  Continue on aggressive GERD diet.  Continue with weight loss journey  Gastroesophageal reflux disease  -improved on current regimen GERD is managed with Protonix , and Pepcid  has  been discontinued. Dietary modifications include a low-carb, high-protein intake, avoiding fried foods and nitrates. Sleep has improved with magnesium complex. Continue Protonix  and maintain the reflux diet.  Obesity  -BMI 36.  Patient is doing very well with her weight loss regimen. Significant weight loss has been achieved through dietary changes and exercise. The current regimen includes a low-carb, high-protein diet with [employer] shakes for additional protein. Weight loss positively impacts overall health and respiratory function. Continue the current dietary and exercise regimen and encourage further weight loss.  Plan  Patient Instructions  Continue on Protonix  40mg  daily in am  GERD diet.  Flutter valve Twice daily   Robitussin DM 1 tsp every 6hr as needed for cough  Follow up in 6 months with Dr. Geronimo and As needed  with PFT         Madelin Stank, NP 10/09/2024

## 2024-10-09 NOTE — Patient Instructions (Addendum)
 Continue on Protonix  40mg  daily in am  GERD diet.  Flutter valve Twice daily   Robitussin DM 1 tsp every 6hr as needed for cough  Follow up in 6 months with Dr. Geronimo and As needed  with PFT

## 2024-10-24 ENCOUNTER — Ambulatory Visit: Payer: Self-pay | Admitting: Nurse Practitioner

## 2024-10-24 ENCOUNTER — Encounter: Payer: Self-pay | Admitting: Nurse Practitioner

## 2024-10-24 VITALS — BP 132/64 | HR 65 | Wt 203.0 lb

## 2024-10-24 DIAGNOSIS — I10 Essential (primary) hypertension: Secondary | ICD-10-CM | POA: Diagnosis not present

## 2024-10-24 DIAGNOSIS — E559 Vitamin D deficiency, unspecified: Secondary | ICD-10-CM | POA: Diagnosis not present

## 2024-10-24 DIAGNOSIS — Z0000 Encounter for general adult medical examination without abnormal findings: Secondary | ICD-10-CM | POA: Diagnosis not present

## 2024-10-24 DIAGNOSIS — E782 Mixed hyperlipidemia: Secondary | ICD-10-CM | POA: Diagnosis not present

## 2024-10-24 NOTE — Assessment & Plan Note (Addendum)
 Continue amlodipine  5mg  daily  DASH diet and commitment to daily physical activity for a minimum of 30 minutes discussed and encouraged, as a part of hypertension management. The importance of attaining a healthy weight is also discussed.     10/24/2024    8:18 AM 10/09/2024   10:34 AM 08/21/2024    8:12 AM 07/10/2024   11:09 AM 06/18/2024   10:36 AM 06/18/2024   10:05 AM 06/18/2024    9:54 AM  BP/Weight  Systolic BP 132 116 118 140 143 148 138  Diastolic BP 64 62 62 70 62 67 59  Wt. (Lbs) 203 204.6  222.6   220  BMI 35.96 kg/m2 36.24 kg/m2  39.43 kg/m2   38.97 kg/m2

## 2024-10-24 NOTE — Assessment & Plan Note (Signed)
°  Pneumonia and tetanus vaccines not received. Bone density scan not completed. - Recommended pneumonia and tetanus vaccines at the pharmacy. - Provided phone number for bone density scan follow-up.

## 2024-10-24 NOTE — Progress Notes (Signed)
 Established Patient Office Visit  Subjective:  Patient ID: Rebecca [name], female    DOB: 08/31/1956  Age: 68 y.o. MRN: [id number]  CC:  Chief Complaint  Patient presents with   Hyperlipidemia    Fasting no concerns.    HPI   Discussed the use of AI scribe software for clinical note transcription with the patient, who gave verbal consent to proceed.  History of Present Illness Rebecca [name] is a 68 year old female   has a past medical history of Acid reflux, Anxiety, High blood pressure, Hyperlipidemia, Neck pain, Obesity, Peritonsillar abscess (02/03/2017), PTSD (post-traumatic stress disorder), Sleep apnea, Sleep deprivation, and Thyroid  disease. who presents for follow-up for hyperlipidemia  She is managing her hyperlipidemia with Repatha  140 mg every 14 days. She has a history of problems with statins and has not been taking Zetia  10 mg due to concerns about joint issues. She is on a weight loss journey, incorporating dietary changes and exercise.  Since September, she has been taking semaglutide  from [facility] and has lost 19 pounds, reducing her weight from 222 pounds to 203 pounds. Her diet is low-carb and high-protein, including two to three protein shakes daily. She exercises regularly, with strength training and cardio three days a week each.  She takes several supplements, including vitamin D3 with K2, magnesium triple complex, fish oil, and occasionally B1 and B6. She has experienced left knee and leg pain, which has improved with weight loss and gym activity.  Her current medications also include amlodipine  5 mg daily for hypertension. She has not taken B12 in the last week but had been taking it regularly before that.  No chest pain or shortness of breath reported.  Assessment & Plan     Past Medical History:  Diagnosis Date   Acid reflux    Anxiety    High blood pressure    Hyperlipidemia    Neck pain    Obesity    Peritonsillar abscess  02/03/2017   PTSD (post-traumatic stress disorder)    Sleep apnea    Sleep deprivation    Thyroid  disease    monitoring a nodule 1/9    Past Surgical History:  Procedure Laterality Date   ABDOMINAL HYSTERECTOMY  partial   BREAST LUMPECTOMY Left    Over 20 years ago - benign   COLONOSCOPY     PARTIAL HYSTERECTOMY  2012    Family History  Problem Relation Age of Onset   Pancreatic cancer Mother    Cancer Mother        pancreatic cancer   Diabetes Mother    Heart disease Mother        rheumatic heart disease   Hyperlipidemia Mother    High blood pressure Mother    Cancer Father        renal, bone   Diabetes Father    Hyperlipidemia Father    High blood pressure Father    COPD Sister    Sleep apnea Sister    Lung cancer Sister        asbestosis caused lung CA   Lung cancer Brother        smoked   Kidney disease Brother    Colon cancer Maternal Aunt    Heart attack Maternal Grandfather    Esophageal cancer Neg Hx    Rectal cancer Neg Hx    Stomach cancer Neg Hx     Social History   Socioeconomic History   Marital status: Single  Spouse name: N/A   Number of children: 0   Years of education: 18.5   Highest education level: Master's degree (e.g., MA, MS, MEng, MEd, MSW, MBA)  Occupational History   Occupation: Retired    Associate Professor: ADVICE WORKER    Comment: [employer] x 30 years   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   Retired after 30 years, and took another job (commutes to [place] 4 days/week).   Social Drivers of Health   Tobacco Use: Low Risk (10/24/2024)   Patient History    Smoking Tobacco Use: Never    Smokeless Tobacco Use: Never    Passive Exposure: Past  Financial Resource Strain: Low Risk (10/23/2024)   Overall Financial Resource Strain (CARDIA)    Difficulty of Paying Living Expenses: Not hard at all  Food Insecurity: No Food Insecurity (10/23/2024)   Epic    Worried About Programme Researcher, Broadcasting/film/video in the Last Year: Never true    Ran Out of Food in the Last Year: Never true  Transportation Needs: No Transportation Needs (10/23/2024)   Epic    Lack of Transportation (Medical): No    Lack of Transportation (Non-Medical): No  Physical Activity: Sufficiently Active (10/23/2024)   Exercise Vital Sign    Days of Exercise per Week: 3 days    Minutes of Exercise per Session: 60 min  Stress: No Stress Concern Present (10/23/2024)   [employer] of Occupational Health - Occupational Stress Questionnaire    Feeling of Stress: Not at all  Social Connections: Unknown (10/23/2024)   Social Connection and Isolation Panel    Frequency of Communication with Friends and Family: More than three times a week    Frequency of Social Gatherings with Friends and Family: Once a week    Attends Religious Services: 1 to 4 times per year    Active Member of [employer] or Organizations: Not on file    Attends Banker Meetings: Not on file    Marital Status: Never married  Intimate Partner Violence: Not At Risk (12/23/2022)   Humiliation, Afraid, Rape, and Kick questionnaire    Fear of Current or Ex-Partner: No    Emotionally Abused: No    Physically Abused: No    Sexually Abused: No  Depression (PHQ2-9): Low Risk (10/24/2024)   Depression (PHQ2-9)    PHQ-2 Score: 0  Alcohol Screen: Low Risk (10/23/2024)   Alcohol Screen    Last Alcohol Screening  Score (AUDIT): 1  Housing: Low Risk (10/23/2024)   Epic    Unable to Pay for Housing in the Last Year: No    Number of Times Moved in the Last Year: 0    Homeless in the Last Year: No  Utilities: Not At Risk (12/23/2022)   AHC Utilities    Threatened with loss of utilities:  No  Health Literacy: Not on file    Outpatient Medications Prior to Visit  Medication Sig Dispense Refill   amLODipine  (NORVASC ) 5 MG tablet TAKE 1 TABLET(5 MG) BY MOUTH DAILY 90 tablet 0   Ergocalciferol  (VITAMIN D2 PO) Take by mouth. With K2     Evolocumab  (REPATHA  SURECLICK) 140 MG/ML SOAJ Inject 140 mg into the skin every 14 (fourteen) days. 2 mL 3   MAGNESIUM PO Take by mouth. Triple complex     pantoprazole  (PROTONIX ) 40 MG tablet TAKE 1 TABLET(40 MG) BY MOUTH DAILY 30 tablet 3   traZODone  (DESYREL ) 50 MG tablet Take 0.5-1 tablets (25-50 mg total) by mouth at bedtime as needed for sleep. 90 tablet 3   ezetimibe  (ZETIA ) 10 MG tablet Take 1 tablet (10 mg total) by mouth daily. (Patient not taking: Reported on 10/24/2024) 90 tablet 3   Tiotropium Bromide-Olodaterol (STIOLTO RESPIMAT ) 2.5-2.5 MCG/ACT AERS Inhale 2 puffs into the lungs daily. (Patient not taking: Reported on 10/24/2024) 1 each 5   topiramate  (TOPAMAX ) 25 MG tablet Take 1 tablet (25 mg total) by mouth daily with supper. (Patient not taking: Reported on 10/24/2024) 30 tablet 0   No facility-administered medications prior to visit.    Allergies[1]  ROS Review of Systems  Constitutional:  Negative for appetite change, chills, fatigue and fever.  HENT:  Negative for congestion, postnasal drip, rhinorrhea and sneezing.   Respiratory:  Negative for cough, shortness of breath and wheezing.   Cardiovascular:  Negative for chest pain, palpitations and leg swelling.  Gastrointestinal:  Negative for abdominal pain, constipation, nausea and vomiting.  Genitourinary:  Negative for difficulty urinating, dysuria, flank pain and frequency.  Musculoskeletal:   Negative for arthralgias, back pain, joint swelling and myalgias.  Skin:  Negative for color change, pallor, rash and wound.  Neurological:  Negative for dizziness, facial asymmetry, weakness, numbness and headaches.  Psychiatric/Behavioral:  Negative for behavioral problems, confusion, self-injury and suicidal ideas.       Objective:    Physical Exam Vitals and nursing note reviewed.  Constitutional:      General: She is not in acute distress.    Appearance: Normal appearance. She is obese. She is not ill-appearing, toxic-appearing or diaphoretic.  HENT:     Mouth/Throat:     Pharynx: Oropharynx is clear.  Eyes:     General: No scleral icterus.       Right eye: No discharge.        Left eye: No discharge.     Extraocular Movements: Extraocular movements intact.     Conjunctiva/sclera: Conjunctivae normal.  Cardiovascular:     Rate and Rhythm: Normal rate and regular rhythm.     Pulses: Normal pulses.     Heart sounds: Normal heart sounds. No murmur heard.    No friction rub. No gallop.  Pulmonary:     Effort: Pulmonary effort is normal. No respiratory distress.     Breath sounds: Normal breath sounds. No stridor. No wheezing, rhonchi or rales.  Chest:     Chest wall: No tenderness.  Abdominal:     General: There is no distension.     Palpations: Abdomen is soft.     Tenderness: There is no abdominal tenderness. There is no right CVA tenderness, left CVA tenderness or guarding.  Musculoskeletal:        General: No swelling, tenderness, deformity or signs of injury.     Right lower leg: No edema.     Left lower leg: No edema.  Skin:    General: Skin is warm and dry.     Capillary Refill: Capillary refill takes less than 2 seconds.     Coloration: Skin is not jaundiced or pale.     Findings: No bruising, erythema or lesion.  Neurological:     Mental Status: She is alert and oriented to person, place, and time.     Motor: No weakness.     Gait: Gait normal.   Psychiatric:        Mood and Affect: Mood normal.        Behavior: Behavior normal.        Thought Content: Thought content normal.        Judgment: Judgment normal.     BP 132/64   Pulse 65   Wt 203 lb (92.1 kg)   SpO2 100%   BMI 35.96 kg/m  Wt Readings from Last 3 Encounters:  10/24/24 203 lb (92.1 kg)  10/09/24 204 lb 9.6 oz (92.8 kg)  07/10/24 222 lb 9.6 oz (101 kg)    Lab Results  Component Value Date   TSH 1.430 10/15/2022   Lab Results  Component Value Date   WBC 8.0 07/06/2024   HGB 13.6 07/06/2024   HCT 41.0 07/06/2024   MCV 93 07/06/2024   PLT 223 07/06/2024   Lab Results  Component Value Date   NA 141 07/06/2024   K 3.7 07/06/2024   CO2 23 07/06/2024   GLUCOSE 85 07/06/2024   BUN 14 07/06/2024   CREATININE 0.70 07/06/2024   BILITOT 0.5 07/06/2024   ALKPHOS 113 07/06/2024   AST 21 07/06/2024   ALT 9 07/06/2024   PROT 7.8 07/06/2024   ALBUMIN 4.6 07/06/2024   CALCIUM  9.6 07/06/2024   EGFR 96 07/06/2024   Lab Results  Component Value Date   CHOL 223 (H) 08/21/2024   Lab Results  Component Value Date   HDL 56 08/21/2024   Lab Results  Component Value Date   LDLCALC 134 (H) 08/21/2024   Lab Results  Component Value Date   TRIG 187 (H) 08/21/2024   Lab Results  Component Value Date   CHOLHDL 4.0 08/21/2024   Lab Results  Component Value Date   HGBA1C 5.8 (H) 07/06/2024      Assessment & Plan:   Problem List Items Addressed This Visit       Cardiovascular and Mediastinum   Essential hypertension - Primary   Continue amlodipine  5mg  daily  DASH diet and commitment to daily physical activity for a minimum of 30 minutes discussed and encouraged, as a part of hypertension management. The importance of attaining a healthy weight is also discussed.     10/24/2024    8:18 AM 10/09/2024   10:34 AM 08/21/2024    8:12 AM 07/10/2024   11:09 AM 06/18/2024   10:36 AM 06/18/2024   10:05 AM 06/18/2024    9:54 AM  BP/Weight  Systolic BP  132 883 118 140 143 148 138  Diastolic BP 64 62 62 70 62 67 59  Wt. (Lbs) 203 204.6  222.6   220  BMI 35.96 kg/m2 36.24 kg/m2  39.43 kg/m2   38.97 kg/m2             Other   Mixed hyperlipidemia (Chronic)   Lab Results  Component Value Date   CHOL 223 (H) 08/21/2024   HDL 56 08/21/2024   LDLCALC 134 (H) 08/21/2024   TRIG 187 (H) 08/21/2024   CHOLHDL 4.0 08/21/2024  Managed with Repatha  140 mg biweekly. Considering Zetia  due to statin intolerance. - Encouraged starting Zetia  10 mg daily.       Relevant Orders   Lipid panel   Morbid obesity (HCC)   Wt Readings from Last 3 Encounters:  10/24/24 203 lb (92.1 kg)  10/09/24 204 lb 9.6 oz (92.8 kg)  07/10/24 222 lb 9.6 oz (101 kg)   Body mass index is 35.96 kg/m.   Lost 18 pounds since September, currently 203 pounds. Following low-carb diet and regular exercise. Using semaglutide  for weight loss. - Continue low-carb diet and regular exercise. - Continue semaglutide  as per weight loss program. - Monitor weight and adjust semaglutide  dosage as needed.       Vitamin D  deficiency   Last vitamin D  Lab Results  Component Value Date   VD25OH 23.6 (L) 07/06/2024   Taking vitamin D3 with K2, reports improvement in knee and leg pain. Also taking magnesium and B12. - Checked vitamin D , B12, and magnesium levels.       Relevant Orders   VITAMIN D  25 Hydroxy (Vit-D Deficiency, Fractures)   Health care maintenance    Pneumonia and tetanus vaccines not received. Bone density scan not completed. - Recommended pneumonia and tetanus vaccines at the pharmacy. - Provided phone number for bone density scan follow-up.      Relevant Orders   Magnesium   Vitamin B12    No orders of the defined types were placed in this encounter.   Follow-up: Return in about 4 months (around 02/22/2025) for HTN, HYPERLIPIDEMIA.    Emersyn Wyss R Tunisha Ruland, FNP    [1]  Allergies Allergen Reactions   Prednisolone Other (See Comments)     Patient can't remember  Other reaction(s): Delusions (intolerance) Patient can't remember    Prednisone Other (See Comments)    Other reaction(s): BAD DREAMS/HALLUCINATIONS  Other Reaction(s): BAD DREAMS/HALLUCINATIONS

## 2024-10-24 NOTE — Assessment & Plan Note (Signed)
 Last vitamin D  Lab Results  Component Value Date   VD25OH 23.6 (L) 07/06/2024   Taking vitamin D3 with K2, reports improvement in knee and leg pain. Also taking magnesium and B12. - Checked vitamin D , B12, and magnesium levels.

## 2024-10-24 NOTE — Patient Instructions (Signed)
 Please call the office below to schedule your bone density scan to screen for osteoporosis  [facility] Address: [address] [place], [place] [address] Phone: [phone]   It is important that you exercise regularly at least 30 minutes 5 times a week as tolerated  Think about what you will eat, plan ahead. Choose  clean, green, fresh or frozen over canned, processed or packaged foods which are more sugary, salty and fatty. 70 to 75% of food eaten should be vegetables and fruit. Three meals at set times with snacks allowed between meals, but they must be fruit or vegetables. Aim to eat over a 12 hour period , example 7 am to 7 pm, and STOP after  your last meal of the day. Drink water,generally about 64 ounces per day, no other drink is as healthy. Fruit juice is best enjoyed in a healthy way, by EATING the fruit.  Thanks for choosing [facility] we consider it a privelige to serve you.

## 2024-10-24 NOTE — Assessment & Plan Note (Addendum)
 Wt Readings from Last 3 Encounters:  10/24/24 203 lb (92.1 kg)  10/09/24 204 lb 9.6 oz (92.8 kg)  07/10/24 222 lb 9.6 oz (101 kg)   Body mass index is 35.96 kg/m.   Lost 18 pounds since September, currently 203 pounds. Following low-carb diet and regular exercise. Using semaglutide  for weight loss. - Continue low-carb diet and regular exercise. - Continue semaglutide  as per weight loss program. - Monitor weight and adjust semaglutide  dosage as needed.

## 2024-10-24 NOTE — Assessment & Plan Note (Signed)
 Lab Results  Component Value Date   CHOL 223 (H) 08/21/2024   HDL 56 08/21/2024   LDLCALC 134 (H) 08/21/2024   TRIG 187 (H) 08/21/2024   CHOLHDL 4.0 08/21/2024    Managed with Repatha  140 mg biweekly. Considering Zetia  due to statin intolerance. - Encouraged starting Zetia  10 mg daily.

## 2024-10-25 LAB — LIPID PANEL
Chol/HDL Ratio: 3.3 ratio (ref 0.0–4.4)
Cholesterol, Total: 217 mg/dL — ABNORMAL HIGH (ref 100–199)
HDL: 65 mg/dL (ref >39)
LDL Chol Calc (NIH): 125 mg/dL — ABNORMAL HIGH (ref 0–99)
Triglycerides: 157 mg/dL — ABNORMAL HIGH (ref 0–149)
VLDL Cholesterol Cal: 27 mg/dL (ref 5–40)

## 2024-10-25 LAB — VITAMIN B12: Vitamin B-12: >2000 pg/mL — ABNORMAL HIGH (ref 232–1245)

## 2024-10-25 LAB — VITAMIN D 25 HYDROXY (VIT D DEFICIENCY, FRACTURES): Vit D, 25-Hydroxy: 35.8 ng/mL (ref 30.0–100.0)

## 2024-10-25 LAB — MAGNESIUM: Magnesium: 2 mg/dL (ref 1.6–2.3)

## 2024-10-26 ENCOUNTER — Ambulatory Visit: Payer: Self-pay | Admitting: Nurse Practitioner

## 2024-10-26 ENCOUNTER — Other Ambulatory Visit: Payer: Self-pay | Admitting: Nurse Practitioner

## 2024-10-26 DIAGNOSIS — E7800 Pure hypercholesterolemia, unspecified: Secondary | ICD-10-CM

## 2024-12-25 ENCOUNTER — Ambulatory Visit: Payer: Self-pay | Admitting: Nurse Practitioner

## 2025-03-25 ENCOUNTER — Other Ambulatory Visit (HOSPITAL_BASED_OUTPATIENT_CLINIC_OR_DEPARTMENT_OTHER)

## 2025-04-09 ENCOUNTER — Encounter
